# Patient Record
Sex: Female | Born: 1975 | Race: Black or African American | Hispanic: No | Marital: Married | Smoking: Former smoker
Health system: Southern US, Community
[De-identification: ages and names within clinical notes are randomized; demographics above are authoritative.]

## PROBLEM LIST (undated history)

## (undated) ENCOUNTER — Ambulatory Visit: Payer: No Typology Code available for payment source

## (undated) DIAGNOSIS — J309 Allergic rhinitis, unspecified: Secondary | ICD-10-CM

## (undated) DIAGNOSIS — D509 Iron deficiency anemia, unspecified: Secondary | ICD-10-CM

## (undated) DIAGNOSIS — G4733 Obstructive sleep apnea (adult) (pediatric): Secondary | ICD-10-CM

## (undated) DIAGNOSIS — M549 Dorsalgia, unspecified: Secondary | ICD-10-CM

## (undated) DIAGNOSIS — K219 Gastro-esophageal reflux disease without esophagitis: Secondary | ICD-10-CM

## (undated) DIAGNOSIS — E063 Autoimmune thyroiditis: Secondary | ICD-10-CM

## (undated) DIAGNOSIS — E559 Vitamin D deficiency, unspecified: Secondary | ICD-10-CM

## (undated) DIAGNOSIS — G8929 Other chronic pain: Secondary | ICD-10-CM

## (undated) DIAGNOSIS — F419 Anxiety disorder, unspecified: Secondary | ICD-10-CM

## (undated) DIAGNOSIS — Z8269 Family history of other diseases of the musculoskeletal system and connective tissue: Secondary | ICD-10-CM

## (undated) DIAGNOSIS — E038 Other specified hypothyroidism: Secondary | ICD-10-CM

## (undated) HISTORY — PX: ABDOMINAL HYSTERECTOMY: SHX81

## (undated) HISTORY — DX: Morbid (severe) obesity due to excess calories: E66.01

## (undated) HISTORY — DX: Dorsalgia, unspecified: M54.9

## (undated) HISTORY — DX: Anxiety disorder, unspecified: F41.9

## (undated) HISTORY — DX: Iron deficiency anemia, unspecified: D50.9

## (undated) HISTORY — DX: Allergic rhinitis, unspecified: J30.9

## (undated) HISTORY — PX: CHOLECYSTECTOMY: SHX55

## (undated) HISTORY — DX: Obstructive sleep apnea (adult) (pediatric): G47.33

## (undated) HISTORY — DX: Autoimmune thyroiditis: E06.3

## (undated) HISTORY — DX: Other chronic pain: G89.29

## (undated) HISTORY — DX: Other specified hypothyroidism: E03.8

## (undated) HISTORY — DX: Vitamin D deficiency, unspecified: E55.9

## (undated) HISTORY — DX: Family history of other diseases of the musculoskeletal system and connective tissue: Z82.69

---

## 2015-09-02 DIAGNOSIS — D500 Iron deficiency anemia secondary to blood loss (chronic): Secondary | ICD-10-CM | POA: Insufficient documentation

## 2015-09-02 DIAGNOSIS — E559 Vitamin D deficiency, unspecified: Secondary | ICD-10-CM | POA: Insufficient documentation

## 2015-09-02 DIAGNOSIS — E038 Other specified hypothyroidism: Secondary | ICD-10-CM | POA: Insufficient documentation

## 2016-02-09 DIAGNOSIS — Z8269 Family history of other diseases of the musculoskeletal system and connective tissue: Secondary | ICD-10-CM | POA: Insufficient documentation

## 2016-02-09 DIAGNOSIS — G4733 Obstructive sleep apnea (adult) (pediatric): Secondary | ICD-10-CM | POA: Insufficient documentation

## 2017-03-26 ENCOUNTER — Emergency Department (HOSPITAL_COMMUNITY)
Admission: EM | Admit: 2017-03-26 | Discharge: 2017-03-26 | Disposition: A | Discharge status: Home / Self Care | Payer: BLUE CROSS/BLUE SHIELD | Attending: Emergency Medicine | Admitting: Emergency Medicine

## 2017-03-26 ENCOUNTER — Emergency Department (HOSPITAL_COMMUNITY): Payer: BLUE CROSS/BLUE SHIELD

## 2017-03-26 ENCOUNTER — Encounter (HOSPITAL_COMMUNITY): Payer: Self-pay | Admitting: Emergency Medicine

## 2017-03-26 ENCOUNTER — Other Ambulatory Visit: Payer: Self-pay

## 2017-03-26 DIAGNOSIS — M79644 Pain in right finger(s): Secondary | ICD-10-CM

## 2017-03-26 HISTORY — DX: Gastro-esophageal reflux disease without esophagitis: K21.9

## 2017-03-26 NOTE — ED Triage Notes (Signed)
Pt reports R thumb pain x 2 weeks, denies injury but reports moving and using hands more than usual that week.  Pt reports bump on thumb, not present on L thumb, reports full ROM but with pain.

## 2017-03-26 NOTE — Discharge Instructions (Signed)
X-ray is reassuring.  No fracture or evidence of arthritis.  Please use 800 mg ibuprofen every 6 hours as needed for pain.  You can also use topical medicine like icy hot for your symptoms.  Ice the finger twice a day for 15 minutes at a time.  You may follow-up with your primary care provider if your pain continues in a week.

## 2017-03-26 NOTE — ED Provider Notes (Signed)
[facility] EMERGENCY DEPARTMENT Provider Note   CSN: [id number] Arrival date & time: 03/26/17  1006     History   Chief Complaint Chief Complaint  Patient presents with  . Hand Pain    HPI Heather Campos is a 42 y.o. female.  HPI   [name] is a 42 year old female with a history of GERD who presents to the emergency department for evaluation of right thumb pain.  States that her pain has been ongoing for 2 weeks now.  Pain is located over the right MCP joint of the 1st finger. Patient states that it feels like she jammed her right thumb and that it needs to be popped. Her pain is 6/10 in severity and worsened with thumb movement. She has tried taking Tylenol for her symptoms with mild improvement. She denies inciting injury, reports that she is right-handed and recently using her hands more as she is in the process of a move. She denies numbness, weakness, joint redness/swelling, wound, arthralgias elsewhere.  Past Medical History:  Diagnosis Date  . GERD (gastroesophageal reflux disease)   . Thyroid disease     There are no active problems to display for this patient.   Past Surgical History:  Procedure Laterality Date  . CESAREAN SECTION    . CHOLECYSTECTOMY      OB History    No data available       Home Medications    Prior to Admission medications   Not on File    Family History No family history on file.  Social History Social History   Tobacco Use  . Smoking status: Never Smoker  . Smokeless tobacco: Never Used  Substance Use Topics  . Alcohol use: No    Frequency: Never  . Drug use: No     Allergies   Patient has no known allergies.   Review of Systems Review of Systems  Musculoskeletal: Positive for arthralgias (right thumb pain). Negative for joint swelling.  Skin: Negative for color change and wound.  Neurological: Negative for weakness and numbness.     Physical Exam Updated Vital Signs BP 128/86 (BP  Location: Right Arm)   Pulse 93   Temp 98.6 F (37 C) (Oral)   Resp 16   LMP 03/04/2017 (Exact Date)   SpO2 99%   Physical Exam  Constitutional: She is oriented to person, place, and time. She appears well-developed and well-nourished. No distress.  HENT:  Head: Normocephalic and atraumatic.  Eyes: Right eye exhibits no discharge. Left eye exhibits no discharge.  Pulmonary/Chest: Effort normal. No respiratory distress.  Musculoskeletal:  1st MCP joint of the right hand somewhat tender to palpation. No obvious deformity, erythema, warmth, ecchymosis, swelling or break in skin. Full ROM of the thumb. Grip strength 5/5 bilaterally. Radial pulse 2+ bilaterally. Capillary refill <2sec. Distal sensation to light touch intact.   Neurological: She is alert and oriented to person, place, and time. Coordination normal.  Skin: Skin is warm and dry. Capillary refill takes less than 2 seconds. She is not diaphoretic.  Psychiatric: She has a normal mood and affect. Her behavior is normal.  Nursing note and vitals reviewed.    ED Treatments / Results  Labs (all labs ordered are listed, but only abnormal results are displayed) Labs Reviewed - No data to display  EKG  EKG Interpretation None       Radiology Dg Finger Thumb Right  Result Date: 03/26/2017 CLINICAL DATA:  Right thumb pain for 2 weeks. EXAM:  RIGHT THUMB 2+V COMPARISON:  None. FINDINGS: There is no evidence of fracture or dislocation. There is no evidence of arthropathy or other focal bone abnormality. Soft tissues are unremarkable IMPRESSION: Negative. Electronically Signed   By: Fidela Salisbury M.D.   On: 03/26/2017 11:09    Procedures Procedures (including critical care time)  Medications Ordered in ED Medications - No data to display   Initial Impression / Assessment and Plan / ED Course  I have reviewed the triage vital signs and the nursing notes.  Pertinent labs & imaging results that were available during my  care of the patient were reviewed by me and considered in my medical decision making (see chart for details).    Xray of right thumb without acute fracture or abnormality. She has full ROM, no indication for ligamentous injury. The finger is neurovascularly intact on exam. No warmth, erythema or break in skin to suggest infection. Have counseled patient on NSAID use for pain and RICE protocol. Discussed follow up with her PCP if symptoms are not improving in a week. Patient agrees and voices understanding to the above plan.   Final Clinical Impressions(s) / ED Diagnoses   Final diagnoses:  None    ED Discharge Orders    None       Bernarda Caffey 03/27/17 6256    Tanna Furry, MD 03/27/17 2157

## 2017-03-26 NOTE — ED Provider Notes (Signed)
[facility] EMERGENCY DEPARTMENT Provider Note   CSN: [id number] Arrival date & time: 03/26/17  1006     History   Chief Complaint Chief Complaint  Patient presents with  . Hand Pain    HPI Heather Campos is a 42 y.o. female.  HPI  Heather Campos is a 42 year old female with a history of GERD who presents to the emergency department for evaluation of right thumb pain.  Patient states that her pain is primarily located over the first MCP joint of the right hand.  Her pain is been ongoing for 2 weeks now, she denies inciting injury.  Pain is constant, worsened with movement of the thumb.  States that her pain is moderate in severity and feels aching in nature.  She is right-handed and states that she recently was moving boxes which may be contributing to her pain.  She has tried icing the hand and taking Tylenol without significant relief.  Reports "it feels like someone needs to pop my thumb."  She denies fever, chills, numbness, weakness, erythema, swelling, wound, joint pain elsewhere.  Past Medical History:  Diagnosis Date  . GERD (gastroesophageal reflux disease)   . Thyroid disease     There are no active problems to display for this patient.   Past Surgical History:  Procedure Laterality Date  . CESAREAN SECTION    . CHOLECYSTECTOMY      OB History    No data available       Home Medications    Prior to Admission medications   Not on File    Family History No family history on file.  Social History Social History   Tobacco Use  . Smoking status: Never Smoker  . Smokeless tobacco: Never Used  Substance Use Topics  . Alcohol use: No    Frequency: Never  . Drug use: No     Allergies   Patient has no known allergies.   Review of Systems Review of Systems  Constitutional: Negative for chills, fatigue and fever.  Musculoskeletal: Positive for arthralgias (right thumb). Negative for joint swelling.  Skin: Negative for color  change and wound.  Neurological: Negative for weakness and numbness.     Physical Exam Updated Vital Signs BP 128/86 (BP Location: Right Arm)   Pulse 93   Temp 98.6 F (37 C) (Oral)   Resp 16   LMP 03/04/2017 (Exact Date)   SpO2 99%   Physical Exam  Constitutional: She appears well-developed and well-nourished. No distress.  HENT:  Head: Normocephalic and atraumatic.  Eyes: Right eye exhibits no discharge. Left eye exhibits no discharge.  Pulmonary/Chest: Effort normal. No respiratory distress.  Musculoskeletal:  Right thumb and hand are nontender.  No obvious deformity.  No erythema, ecchymosis or break in skin.  Full active range of motion of the right thumb and wrist.  Radial pulses 2+ bilaterally.  Capillary refill <2sec.   Neurological: She is alert. Coordination normal.  Distal sensation to light/sharp touch intact in right thumb.  Skin: Skin is warm and dry. She is not diaphoretic.  Psychiatric: She has a normal mood and affect. Her behavior is normal.  Nursing note and vitals reviewed.    ED Treatments / Results  Labs (all labs ordered are listed, but only abnormal results are displayed) Labs Reviewed - No data to display  EKG  EKG Interpretation None       Radiology Dg Finger Thumb Right  Result Date: 03/26/2017 CLINICAL DATA:  Right thumb pain for  2 weeks. EXAM: RIGHT THUMB 2+V COMPARISON:  None. FINDINGS: There is no evidence of fracture or dislocation. There is no evidence of arthropathy or other focal bone abnormality. Soft tissues are unremarkable IMPRESSION: Negative. Electronically Signed   By: Fidela Salisbury M.D.   On: 03/26/2017 11:09    Procedures Procedures (including critical care time)  Medications Ordered in ED Medications - No data to display   Initial Impression / Assessment and Plan / ED Course  I have reviewed the triage vital signs and the nursing notes.  Pertinent labs & imaging results that were available during my care of  the patient were reviewed by me and considered in my medical decision making (see chart for details).    X-ray without acute abnormality.  Right hand neurovascularly intact on exam.  No erythema, warmth or break in skin to suggest infection.   She has full active range of motion of the thumb, do not suspect ligamentous injury.  Patient is asking for thumb splint for comfort. Will provide this for her and also counseled her on NSAID use and RICE protocol.  Have counseled her to follow-up with her PCP in a week should her pain continue.  Patient agrees and voiced understanding to the above plan.  Final Clinical Impressions(s) / ED Diagnoses   Final diagnoses:  Pain of right thumb    ED Discharge Orders    None       Glyn Ade, PA-C 03/26/17 1143    Glyn Ade, PA-C 03/27/17 0623    Tanna Furry, MD 03/27/17 2157

## 2017-03-26 NOTE — ED Notes (Signed)
Declined W/C at D/C and was escorted to lobby by RN. 

## 2017-06-16 ENCOUNTER — Emergency Department (HOSPITAL_COMMUNITY): Payer: BLUE CROSS/BLUE SHIELD

## 2017-06-16 ENCOUNTER — Emergency Department (HOSPITAL_COMMUNITY)
Admission: EM | Admit: 2017-06-16 | Discharge: 2017-06-16 | Disposition: A | Discharge status: Home / Self Care | Payer: BLUE CROSS/BLUE SHIELD | Attending: Emergency Medicine | Admitting: Emergency Medicine

## 2017-06-16 ENCOUNTER — Encounter (HOSPITAL_COMMUNITY): Payer: Self-pay

## 2017-06-16 ENCOUNTER — Other Ambulatory Visit: Payer: Self-pay

## 2017-06-16 DIAGNOSIS — R0789 Other chest pain: Secondary | ICD-10-CM

## 2017-06-16 DIAGNOSIS — R079 Chest pain, unspecified: Secondary | ICD-10-CM | POA: Insufficient documentation

## 2017-06-16 DIAGNOSIS — R0602 Shortness of breath: Secondary | ICD-10-CM | POA: Insufficient documentation

## 2017-06-16 LAB — BASIC METABOLIC PANEL
Anion gap: 9 (ref 5–15)
BUN: 13 mg/dL (ref 6–20)
CO2: 23 mmol/L (ref 22–32)
Calcium: 9.1 mg/dL (ref 8.9–10.3)
Chloride: 106 mmol/L (ref 101–111)
Creatinine, Ser: 0.8 mg/dL (ref 0.44–1.00)
GFR calc Af Amer: >60 mL/min (ref >60)
GFR calc non Af Amer: >60 mL/min (ref >60)
Glucose, Bld: 107 mg/dL — ABNORMAL HIGH (ref 65–99)
Potassium: 3.7 mmol/L (ref 3.5–5.1)
Sodium: 138 mmol/L (ref 135–145)

## 2017-06-16 LAB — CBC
HCT: 36.8 % (ref 36.0–46.0)
Hemoglobin: 12.6 g/dL (ref 12.0–15.0)
MCH: 26.7 pg (ref 26.0–34.0)
MCHC: 34.2 g/dL (ref 30.0–36.0)
MCV: 78 fL (ref 78.0–100.0)
Platelets: 286 10*3/uL (ref 150–400)
RBC: 4.72 MIL/uL (ref 3.87–5.11)
RDW: 15.3 % (ref 11.5–15.5)
WBC: 9.3 10*3/uL (ref 4.0–10.5)

## 2017-06-16 LAB — I-STAT TROPONIN, ED
Troponin i, poc: 0 ng/mL (ref 0.00–0.08)
Troponin i, poc: 0 ng/mL (ref 0.00–0.08)

## 2017-06-16 LAB — I-STAT BETA HCG BLOOD, ED (MC, WL, AP ONLY): I-stat hCG, quantitative: <5 m[IU]/mL (ref <5)

## 2017-06-16 NOTE — ED Notes (Signed)
Bed: WA22 Expected date:  Expected time:  Means of arrival:  Comments: 

## 2017-06-16 NOTE — ED Provider Notes (Signed)
[facility] DEPT Provider Note   CSN: [id number] Arrival date & time: 06/16/17  1717     History   Chief Complaint Chief Complaint  Patient presents with  . Chest Pain    HPI Heather Campos is a 42 y.o. female.  Complains of anterior chest pain and left-sided neck pain onset 4 PM today lasted 15 minutes obvious pressure accompanied by shortness of breath and mild nausea no sweatiness discomfort was onset while driving a car.  She also reports tingling in both arms for 1 week which has been constant other associated symptoms include metallic taste in her mouth for approximately 2 weeks.  She presently is asymptomatic except for mild neck pain.  Shortness of breath and nausea have resolved.  No treatment prior to coming here.  Nothing makes symptoms better or worse.  No other associated symptoms  HPI  Past Medical History:  Diagnosis Date  . GERD (gastroesophageal reflux disease)   . Thyroid disease     There are no active problems to display for this patient.   Past Surgical History:  Procedure Laterality Date  . CESAREAN SECTION    . CHOLECYSTECTOMY       OB History   None      Home Medications    Prior to Admission medications   Not on File    Family History History reviewed. No pertinent family history. Family history negative for Cardiac disease Social History Social History   Tobacco Use  . Smoking status: Never Smoker  . Smokeless tobacco: Never Used  Substance Use Topics  . Alcohol use: No    Frequency: Never  . Drug use: No     Allergies   Patient has no known allergies.   Review of Systems Review of Systems  Constitutional: Negative.   HENT: Negative.        Metallic taste  Respiratory: Positive for shortness of breath.   Cardiovascular: Positive for chest pain.  Gastrointestinal: Positive for nausea.  Genitourinary:       Irregular menses  Musculoskeletal: Negative.   Skin: Negative.     Neurological: Negative.   Psychiatric/Behavioral: Negative.   All other systems reviewed and are negative.    Physical Exam Updated Vital Signs LMP 06/14/2017   Physical Exam  Constitutional: She appears well-developed and well-nourished.  HENT:  Head: Normocephalic and atraumatic.  Eyes: Pupils are equal, round, and reactive to light. Conjunctivae are normal.  Neck: Neck supple. No tracheal deviation present. No thyromegaly present.  No bruit  Cardiovascular: Normal rate, regular rhythm and normal heart sounds.  No murmur heard. Pulmonary/Chest: Effort normal and breath sounds normal.  Abdominal: Soft. Bowel sounds are normal. She exhibits no distension. There is no tenderness.  Obese  Musculoskeletal: Normal range of motion. She exhibits no edema or tenderness.  Neurological: She is alert. Coordination normal.  Skin: Skin is warm and dry. No rash noted.  Psychiatric: She has a normal mood and affect.  Nursing note and vitals reviewed.    ED Treatments / Results  Labs (all labs ordered are listed, but only abnormal results are displayed) Labs Reviewed  BASIC METABOLIC PANEL - Abnormal; Notable for the following components:      Result Value   Glucose, Bld 107 (*)    All other components within normal limits  CBC  I-STAT TROPONIN, ED  I-STAT BETA HCG BLOOD, ED ([facility], [facility], [facility] ONLY)    EKG EKG Interpretation  Date/Time:  Friday June 16 2017 17:27:17  EDT Ventricular Rate:  95 PR Interval:    QRS Duration: 89 QT Interval:  365 QTC Calculation: 459 R Axis:   -2 Text Interpretation:  Sinus rhythm Low voltage, precordial leads Baseline wander in lead(s) V6 No old tracing to compare Confirmed by [place], Inocente Salles [phone]) on 06/16/2017 7:11:22 PM   Radiology Dg Chest 2 View  Result Date: 06/16/2017 CLINICAL DATA:  Chest pain with shortness of breath EXAM: CHEST - 2 VIEW COMPARISON:  None. FINDINGS: The heart size and mediastinal contours are within normal limits. Both  lungs are clear. The visualized skeletal structures are unremarkable. IMPRESSION: No active cardiopulmonary disease. Electronically Signed   By: Donavan Foil M.D.   On: 06/16/2017 18:03    Procedures Procedures (including critical care time)  Medications Ordered in ED Medications - No data to display  Chest x-ray viewed by me Initial Impression / Assessment and Plan / ED Course  I have reviewed the triage vital signs and the nursing notes.  Pertinent labs & imaging results that were available during my care of the patient were reviewed by me and considered in my medical decision making (see chart for details).    9:45 PM patient asymptomatic, pain-free  Heart score =1 stone single risk factor of obesity Referral primary care Final Clinical Impressions(s) / ED Diagnoses  Diagnosis atypical chest pain Final diagnoses:  None    ED Discharge Orders    None       Orlie Dakin, MD 06/16/17 2151

## 2017-06-16 NOTE — Discharge Instructions (Addendum)
Call the number on these instructions to get a primary care physician.  Return if concern for any reason

## 2017-06-16 NOTE — ED Triage Notes (Signed)
Pt reports chest pain, arm pain and SOB. She states that her arms have been aching bilaterally for about a week, but today, her L feels especially heavy. The pain goes up neck. The SOB and chest pain started about an hour ago. A&Ox4. Denies cardiac history. Endorses nausea. Denies vomiting.

## 2017-07-26 ENCOUNTER — Other Ambulatory Visit: Payer: Self-pay | Admitting: Obstetrics & Gynecology

## 2017-07-26 DIAGNOSIS — Z1231 Encounter for screening mammogram for malignant neoplasm of breast: Secondary | ICD-10-CM

## 2017-09-22 ENCOUNTER — Emergency Department (HOSPITAL_COMMUNITY)
Admission: EM | Admit: 2017-09-22 | Discharge: 2017-09-22 | Disposition: A | Discharge status: Home / Self Care | Payer: Self-pay | Attending: Emergency Medicine | Admitting: Emergency Medicine

## 2017-09-22 ENCOUNTER — Encounter (HOSPITAL_COMMUNITY): Payer: Self-pay

## 2017-09-22 ENCOUNTER — Emergency Department (HOSPITAL_COMMUNITY): Payer: Self-pay

## 2017-09-22 ENCOUNTER — Other Ambulatory Visit: Payer: Self-pay

## 2017-09-22 DIAGNOSIS — Y929 Unspecified place or not applicable: Secondary | ICD-10-CM | POA: Insufficient documentation

## 2017-09-22 DIAGNOSIS — Y999 Unspecified external cause status: Secondary | ICD-10-CM | POA: Insufficient documentation

## 2017-09-22 DIAGNOSIS — Y939 Activity, unspecified: Secondary | ICD-10-CM | POA: Insufficient documentation

## 2017-09-22 DIAGNOSIS — M79672 Pain in left foot: Secondary | ICD-10-CM | POA: Insufficient documentation

## 2017-09-22 DIAGNOSIS — W228XXA Striking against or struck by other objects, initial encounter: Secondary | ICD-10-CM | POA: Insufficient documentation

## 2017-09-22 MED ORDER — HYDROCODONE-ACETAMINOPHEN 5-325 MG PO TABS
1.0000 | ORAL_TABLET | Freq: Once | ORAL | Status: AC
  Administered 2017-09-22: 1 via ORAL
  Filled 2017-09-22: qty 1

## 2017-09-22 MED ORDER — IBUPROFEN 200 MG PO TABS
600.0000 mg | ORAL_TABLET | Freq: Once | ORAL | Status: AC
  Administered 2017-09-22: 600 mg via ORAL
  Filled 2017-09-22: qty 3

## 2017-09-22 MED ORDER — HYDROCODONE-ACETAMINOPHEN 5-325 MG PO TABS: 1.0000 | ORAL_TABLET | Freq: Four times a day (QID) | ORAL | 0 refills | Status: DC | PRN

## 2017-09-22 NOTE — Discharge Instructions (Addendum)
You have been seen today for a foot injury. There were no acute abnormalities on the x-rays, including no sign of fracture or dislocation, however, there could be injuries to the soft tissues, such as the ligaments or tendons that are not seen on xrays. There could also be what are called occult fractures that are small fractures not seen on xray. Pain: Take 600 mg of ibuprofen every 6 hours or 440 mg (over the counter dose) to 500 mg (prescription dose) of naproxen every 12 hours for the next 3 days. After this time, these medications may be used as needed for pain. Take these medications with food to avoid upset stomach. Choose only one of these medications, do not take them together.  Tylenol: Should you continue to have additional pain while taking the ibuprofen or naproxen, you may add in tylenol as needed. Your daily total maximum amount of tylenol from all sources should be limited to 4000mg /day for persons without liver problems, or 2000mg /day for those with liver problems. Vicodin: May take Vicodin as needed for severe pain.  Do not drive or perform other dangerous activities while taking the Vicodin.  Please note that each pill of Vicodin contains 325 mg of Tylenol and the above dosage limits apply. Ice: May apply ice to the area over the next 24 hours for 15 minutes at a time to reduce swelling. Elevation: Keep the extremity elevated as often as possible to reduce pain and inflammation. Support: Wear the cam walker for support and comfort. Wear this until pain resolves. You will be weight-bearing as tolerated, which means you can slowly start to put weight on the extremity and increase amount and frequency as pain allows. Follow up: Follow up with the orthopedic or foot specialist within two weeks. Return: Return to the ED for numbness, weakness, increasing pain, overall worsening symptoms, loss of function, or if symptoms are not improving, you have tried to follow up with the orthopedic  specialist, and have been unable to do so.

## 2017-09-22 NOTE — ED Triage Notes (Signed)
Pt is concerned she broke her left foot on her 4th toe. Pt states she knocked it into something on 7/15

## 2017-09-22 NOTE — ED Provider Notes (Signed)
[facility] DEPT Provider Note   CSN: [id number] Arrival date & time: 09/22/17  1622     History   Chief Complaint Chief Complaint  Patient presents with  . Toe Pain    HPI Heather Campos is a 42 y.o. female.  HPI   Heather Campos is a 42 y.o. female, with a history of GERD, presenting to the ED with left foot pain for about the last 2 weeks.  States she ran into a piece of furniture with her left foot on July 15.  Pain has persisted.  She has been taking Tylenol.  Denies numbness, weakness, subsequent injury, swelling, or any other complaints.     Past Medical History:  Diagnosis Date  . GERD (gastroesophageal reflux disease)   . Thyroid disease     There are no active problems to display for this patient.   Past Surgical History:  Procedure Laterality Date  . CESAREAN SECTION    . CHOLECYSTECTOMY       OB History   None      Home Medications    Prior to Admission medications   Medication Sig Start Date End Date Taking? Authorizing Provider  HYDROcodone-acetaminophen (NORCO/VICODIN) 5-325 MG tablet Take 1-2 tablets by mouth every 6 (six) hours as needed for severe pain. 09/22/17   Raylea Adcox, Helane Gunther, PA-C    Family History No family history on file.  Social History Social History   Tobacco Use  . Smoking status: Never Smoker  . Smokeless tobacco: Never Used  Substance Use Topics  . Alcohol use: No    Frequency: Never  . Drug use: No     Allergies   Patient has no known allergies.   Review of Systems Review of Systems  Musculoskeletal: Positive for arthralgias.  Neurological: Negative for weakness and numbness.     Physical Exam Updated Vital Signs BP 129/82 (BP Location: Left Arm)   Pulse 89   Temp 99.4 F (37.4 C) (Oral)   Resp 14   Ht 5\' 4"  (1.626 m)   Wt 114.8 kg (253 lb)   LMP 09/06/2017   SpO2 97%   BMI 43.43 kg/m   Physical Exam  Constitutional: She appears well-developed and well-nourished.  No distress.  HENT:  Head: Normocephalic and atraumatic.  Eyes: Conjunctivae are normal.  Neck: Neck supple.  Cardiovascular: Normal rate, regular rhythm and intact distal pulses.  Pulmonary/Chest: Effort normal.  Musculoskeletal: She exhibits tenderness. She exhibits no edema or deformity.  Tenderness to the left fourth toe into the dorsal fourth metatarsal.  No noted deformity, swelling, erythema, or bruising.  No pain or tenderness in the left ankle.  Neurological: She is alert.  Sensation to light touch grossly intact in the left foot and toes.  Skin: Skin is warm and dry. Capillary refill takes less than 2 seconds. She is not diaphoretic. No pallor.  Psychiatric: She has a normal mood and affect. Her behavior is normal.  Nursing note and vitals reviewed.    ED Treatments / Results  Labs (all labs ordered are listed, but only abnormal results are displayed) Labs Reviewed - No data to display  EKG None  Radiology Dg Foot Complete Left  Result Date: 09/22/2017 CLINICAL DATA:  Patient states that she struck her foot on a furniture dresser at home approximately 2-3 weeks ago. Persistent pain involving the fourth toe. Initial encounter. EXAM: LEFT FOOT - COMPLETE 3+ VIEW COMPARISON:  None. FINDINGS: No evidence of acute fracture or dislocation. Joint spaces  well preserved. Well-preserved bone mineral density. No intrinsic osseous abnormalities. Very small enthesopathic spur at the insertion of the Achilles tendon on the calcaneus. IMPRESSION: No acute or significant osseous abnormality. Electronically Signed   By: Evangeline Dakin M.D.   On: 09/22/2017 17:09    Procedures Procedures (including critical care time)  Medications Ordered in ED Medications  HYDROcodone-acetaminophen (NORCO/VICODIN) 5-325 MG per tablet 1 tablet (has no administration in time range)  ibuprofen (ADVIL,MOTRIN) tablet 600 mg (has no administration in time range)     Initial Impression / Assessment and  Plan / ED Course  I have reviewed the triage vital signs and the nursing notes.  Pertinent labs & imaging results that were available during my care of the patient were reviewed by me and considered in my medical decision making (see chart for details).     Patient presents with persistent left toe and foot pain following an injury.  No acute osseous abnormality on x-ray.  She will follow-up with orthopedics versus podiatry. The patient was given instructions for home care as well as return precautions. Patient voices understanding of these instructions, accepts the plan, and is comfortable with discharge.  Final Clinical Impressions(s) / ED Diagnoses   Final diagnoses:  Left foot pain    ED Discharge Orders        Ordered    HYDROcodone-acetaminophen (NORCO/VICODIN) 5-325 MG tablet  Every 6 hours PRN     09/22/17 1751       Lorayne Bender, PA-C 09/22/17 1759    Dorie Rank, MD 09/22/17 2342

## 2018-03-13 ENCOUNTER — Encounter: Payer: Self-pay | Admitting: Family Medicine

## 2018-03-14 ENCOUNTER — Encounter: Payer: Self-pay | Admitting: Family Medicine

## 2018-03-14 ENCOUNTER — Ambulatory Visit (INDEPENDENT_AMBULATORY_CARE_PROVIDER_SITE_OTHER): Payer: Self-pay | Admitting: Family Medicine

## 2018-03-14 ENCOUNTER — Other Ambulatory Visit: Payer: Self-pay

## 2018-03-14 VITALS — BP 126/75 | HR 71 | Resp 17 | Ht 67.0 in | Wt 255.8 lb

## 2018-03-14 DIAGNOSIS — Z7689 Persons encountering health services in other specified circumstances: Secondary | ICD-10-CM

## 2018-03-14 DIAGNOSIS — Z840 Family history of diseases of the skin and subcutaneous tissue: Secondary | ICD-10-CM

## 2018-03-14 DIAGNOSIS — E038 Other specified hypothyroidism: Secondary | ICD-10-CM

## 2018-03-14 DIAGNOSIS — Z6841 Body Mass Index (BMI) 40.0 and over, adult: Secondary | ICD-10-CM

## 2018-03-14 DIAGNOSIS — E063 Autoimmune thyroiditis: Secondary | ICD-10-CM

## 2018-03-14 DIAGNOSIS — Z1389 Encounter for screening for other disorder: Secondary | ICD-10-CM

## 2018-03-14 DIAGNOSIS — Z131 Encounter for screening for diabetes mellitus: Secondary | ICD-10-CM

## 2018-03-14 DIAGNOSIS — Z23 Encounter for immunization: Secondary | ICD-10-CM

## 2018-03-14 DIAGNOSIS — E559 Vitamin D deficiency, unspecified: Secondary | ICD-10-CM

## 2018-03-14 DIAGNOSIS — Z3202 Encounter for pregnancy test, result negative: Secondary | ICD-10-CM

## 2018-03-14 DIAGNOSIS — D649 Anemia, unspecified: Secondary | ICD-10-CM

## 2018-03-14 LAB — POCT URINALYSIS DIP (CLINITEK)
Bilirubin, UA: NEGATIVE
Blood, UA: NEGATIVE
Glucose, UA: NEGATIVE mg/dL
Ketones, POC UA: NEGATIVE mg/dL
Leukocytes, UA: NEGATIVE
Nitrite, UA: NEGATIVE
POC PROTEIN,UA: NEGATIVE
Spec Grav, UA: 1.02 (ref 1.010–1.025)
Urobilinogen, UA: 0.2 E.U./dL
pH, UA: 5.5 (ref 5.0–8.0)

## 2018-03-14 LAB — POCT URINE PREGNANCY: Preg Test, Ur: NEGATIVE

## 2018-03-14 NOTE — Progress Notes (Signed)
Heather Campos, is a 43 y.o. female  WER:[id number]  PPJ:[id number]  DOB - November 10, 1975  CC:  Chief Complaint  Patient presents with  . Establish Care  . Hypothyroidism  . Anemia       HPI: Heather Campos is a 43 y.o. female is here today to establish care.    Heather Campos medical problems significant for hypothyroidism, iron deficiency anemia, folic acid deficiency, obesity, history of cholecystectomy 2018, recurrent axillary boils.  Patient is here today to establish care.  She recently moved to [facility] from [facility].  She was previously followed by primary care office in [place], [place].  She receives routine screening labs at her prior primary care office.  She reports routine screening of her vitamin D and iron level.  Her mother has lupus and has had complications with nephropathy requiring a renal transplant.  Her primary care was screening her for lupus every 6 months.  She is currently overdue for the screenings. Asymptomatic of fatigue, joint pain, hair loss, rash. 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 She is currently on extended leave prescribe doxycycline.  She works as a Psychologist, clinical.  Denies any routine physical activity. She is current on her flu shot.  Request a tetanus vaccine today.  Uncertain of last Tdap. Patient denies new headaches, chest pain, abdominal pain, nausea, new weakness , numbness or tingling, SOB, edema, or worrisome cough.   Current medications: Current Outpatient Medications:  .  Cholecalciferol (DIALYVITE VITAMIN D3 MAX) 1.25 MG (50000 UT) TABS, Take 1 tablet by mouth 2 (two) times a week., Disp: , Rfl:  .  clindamycin (CLEOCIN T) 1 % lotion, APPLY TOPICALLY ONCE DAILY TO INFECTED AREA FOR 30 DAYS, Disp: , Rfl:  .  doxycycline (VIBRAMYCIN) 100 MG capsule, TAKE 1 CAPSULE BY MOUTH ONCE DAILY FOR 30 DAYS, Disp: , Rfl:  .  ferrous sulfate 325 (65 FE) MG  tablet, Take 1 tablet by mouth 2 (two) times daily., Disp: , Rfl:  .  folic acid (FOLVITE) 1 MG tablet, Take 1 tablet by mouth daily., Disp: , Rfl:  .  levothyroxine (SYNTHROID, LEVOTHROID) 125 MCG tablet, Take 1 tablet by mouth daily., Disp: , Rfl:    Pertinent family medical history: family history includes Kidney disease in her mother; Lupus in her cousin and mother.   No Known Allergies  Social History   Socioeconomic History  . Marital status: Married    Spouse name: Not on file  . Number of children: Not on file  . Years of education: Not on file  . Highest education level: Not on file  Occupational History  . Not on file  Social Needs  . Financial resource strain: Not on file  . Food insecurity:    Worry: Not on file    Inability: Not on file  . Transportation needs:    Medical: Not on file    Non-medical: Not on file  Tobacco Use  . Smoking status: Former Research scientist (life sciences)  . Smokeless tobacco: Never Used  Substance and Sexual Activity  . Alcohol use: No    Frequency: Never  . Drug use: No  . Sexual activity: Yes    Birth control/protection: None  Lifestyle  . Physical activity:    Days per week: Not on file    Minutes per session: Not on file  . Stress: Not on file  Relationships  . Social connections:    Talks on phone:  Not on file    Gets together: Not on file    Attends religious service: Not on file    Active member of club or organization: Not on file    Attends meetings of clubs or organizations: Not on file    Relationship status: Not on file  . Intimate partner violence:    Fear of current or ex partner: Not on file    Emotionally abused: Not on file    Physically abused: Not on file    Forced sexual activity: Not on file  Other Topics Concern  . Not on file  Social History Narrative  . Not on file    Review of Systems: Pertinent negatives listed in HPI Objective:   Vitals:   03/14/18 0841  BP: 126/75  Pulse: 71  Resp: 17  SpO2: 99%    BP  Readings from Last 3 Encounters:  03/14/18 126/75  09/22/17 137/85  06/16/17 126/85    Filed Weights   03/14/18 0841  Weight: 255 lb 12.8 oz (116 kg)      Physical Exam: Constitutional: Patient appears well-developed and well-nourished. No distress. HENT: Normocephalic, atraumatic, External right and left ear normal. Oropharynx is clear and moist.  Eyes: Conjunctivae and EOM are normal. PERRLA, no scleral icterus. Neck: Normal ROM. Neck supple. No JVD. No tracheal deviation. No thyromegaly. CVS: RRR, S1/S2 +, no murmurs, no gallops, no carotid bruit.  Pulmonary: Effort and breath sounds normal, no stridor, rhonchi, wheezes, rales.  Abdominal: Soft. BS +, no distension, tenderness, rebound or guarding.  Musculoskeletal: Normal range of motion. No edema and no tenderness.  Neuro: Alert. Normal muscle tone coordination. Normal gait. Skin: Skin is warm and dry. No rash noted. Not diaphoretic. No erythema. No pallor. Psychiatric: Normal mood and affect. Behavior, judgment, thought content normal.  Lab Results (prior encounters)  Lab Results  Component Value Date   WBC 9.3 06/16/2017   HGB 12.6 06/16/2017   HCT 36.8 06/16/2017   MCV 78.0 06/16/2017   PLT 286 06/16/2017   Lab Results  Component Value Date   CREATININE 0.80 06/16/2017   BUN 13 06/16/2017   NA 138 06/16/2017   K 3.7 06/16/2017   CL 106 06/16/2017   CO2 23 06/16/2017       Assessment and plan:  1. Encounter to establish care 2. Hypothyroidism due to Hashimoto's thyroiditis -Currently prescribed levothyroxine 125 mcg.  Rechecking:  - Thyroid Panel With TSH  3. Screening for blood or protein in urine - POCT urine pregnancy  4. Family history of lupus erythematosus - ANA w/Reflex if Positive - Lupus anticoagulant - C3 and C4  5. Vitamin D deficiency - Vitamin D, 25-hydroxy  6. Severe obesity (BMI >= 40) (HCC) Encouraged efforts to reduce weight include engaging in physical activity as tolerated with  goal of 150 minutes per week. Improve dietary choices and eat a meal regimen consistent with a Mediterranean or DASH diet. Reduce simple carbohydrates. Do not skip meals and eat healthy snacks throughout the day to avoid over-eating at dinner. Set a goal weight loss that is achievable for you. Checking: - Lipid panel - Hemoglobin A1c  7. Anemia, unspecified type Checking: - CBC with Differential - Iron, TIBC and Ferritin Panel - Folate  8. Screening for diabetes mellitus - POCT URINALYSIS DIP (CLINITEK) - Comprehensive metabolic panel    Orders Placed This Encounter  Procedures  . Tdap vaccine greater than or equal to 7yo IM  . CBC with Differential  . Comprehensive  metabolic panel    Order Specific Question:   Has the patient fasted?    Answer:   No  . ANA w/Reflex if Positive  . Lupus anticoagulant  . C3 and C4  . Lipid panel    Order Specific Question:   Has the patient fasted?    Answer:   No  . Thyroid Panel With TSH  . Hemoglobin A1c  . Vitamin D, 25-hydroxy  . Iron, TIBC and Ferritin Panel  . Folate  . POCT URINALYSIS DIP (CLINITEK)  . POCT urine pregnancy      Return in about 3 months (around 06/13/2018) for routine well check .   The patient was given clear instructions to go to ER or return to medical center if symptoms don't improve, worsen or new problems develop. The patient verbalized understanding. The patient was advised  to call and obtain lab results if they haven't heard anything from out office within 7-10 business days.  Molli Barrows, FNP Primary Care at [facility] [address], [place] [address] [phone]: [phone]    This note has been created with Dragon speech recognition software and Engineer, materials. Any transcriptional errors are unintentional.

## 2018-03-14 NOTE — Patient Instructions (Addendum)
Thank you for choosing Primary Care at [facility] to be your medical home!    Heather Campos was seen by Molli Barrows, FNP today.   Heather Campos's primary care provider is Scot Jun, FNP.   For the best care possible, you should try to see Molli Barrows, FNP-C whenever you come to the clinic.   We look forward to seeing you again soon!  If you have any questions about your visit today, please call us at [phone] or feel free to reach your primary care provider via [place].     Health Maintenance, Female Adopting a healthy lifestyle and getting preventive care can go a long way to promote health and wellness. Talk with your health care provider about what schedule of regular examinations is right for you. This is a good chance for you to check in with your provider about disease prevention and staying healthy. In between checkups, there are plenty of things you can do on your own. Experts have done a lot of research about which lifestyle changes and preventive measures are most likely to keep you healthy. Ask your health care provider for more information. Weight and diet Eat a healthy diet  Be sure to include plenty of vegetables, fruits, low-fat dairy products, and lean protein.  Do not eat a lot of foods high in solid fats, added sugars, or salt.  Get regular exercise. This is one of the most important things you can do for your health. ? Most adults should exercise for at least 150 minutes each week. The exercise should increase your heart rate and make you sweat (moderate-intensity exercise). ? Most adults should also do strengthening exercises at least twice a week. This is in addition to the moderate-intensity exercise. Maintain a healthy weight  Body mass index (BMI) is a measurement that can be used to identify possible weight problems. It estimates body fat based on height and weight. Your health care provider can help determine your BMI and help you  achieve or maintain a healthy weight.  For females 24 years of age and older: ? A BMI below 18.5 is considered underweight. ? A BMI of 18.5 to 24.9 is normal. ? A BMI of 25 to 29.9 is considered overweight. ? A BMI of 30 and above is considered obese. Watch levels of cholesterol and blood lipids  You should start having your blood tested for lipids and cholesterol at 41 years of age, then have this test every 5 years.  You may need to have your cholesterol levels checked more often if: ? Your lipid or cholesterol levels are high. ? You are older than 41 years of age. ? You are at high risk for heart disease. Cancer screening Lung Cancer  Lung cancer screening is recommended for adults 1-62 years old who are at high risk for lung cancer because of a history of smoking.  A yearly low-dose CT scan of the lungs is recommended for people who: ? Currently smoke. ? Have quit within the past 15 years. ? Have at least a 30-pack-year history of smoking. A pack year is smoking an average of one pack of cigarettes a day for 1 year.  Yearly screening should continue until it has been 15 years since you quit.  Yearly screening should stop if you develop a health problem that would prevent you from having lung cancer treatment. Breast Cancer  Practice breast self-awareness. This means understanding how your breasts normally appear and feel.  It also means doing  regular breast self-exams. Let your health care provider know about any changes, no matter how small.  If you are in your 20s or 30s, you should have a clinical breast exam (CBE) by a health care provider every 1-3 years as part of a regular health exam.  If you are 38 or older, have a CBE every year. Also consider having a breast X-ray (mammogram) every year.  If you have a family history of breast cancer, talk to your health care provider about genetic screening.  If you are at high risk for breast cancer, talk to your health care  provider about having an MRI and a mammogram every year.  Breast cancer gene (BRCA) assessment is recommended for women who have family members with BRCA-related cancers. BRCA-related cancers include: ? Breast. ? Ovarian. ? Tubal. ? Peritoneal cancers.  Results of the assessment will determine the need for genetic counseling and BRCA1 and BRCA2 testing. Cervical Cancer Your health care provider may recommend that you be screened regularly for cancer of the pelvic organs (ovaries, uterus, and vagina). This screening involves a pelvic examination, including checking for microscopic changes to the surface of your cervix (Pap test). You may be encouraged to have this screening done every 3 years, beginning at age 69.  For women ages 16-65, health care providers may recommend pelvic exams and Pap testing every 3 years, or they may recommend the Pap and pelvic exam, combined with testing for human papilloma virus (HPV), every 5 years. Some types of HPV increase your risk of cervical cancer. Testing for HPV may also be done on women of any age with unclear Pap test results.  Other health care providers may not recommend any screening for nonpregnant women who are considered low risk for pelvic cancer and who do not have symptoms. Ask your health care provider if a screening pelvic exam is right for you.  If you have had past treatment for cervical cancer or a condition that could lead to cancer, you need Pap tests and screening for cancer for at least 20 years after your treatment. If Pap tests have been discontinued, your risk factors (such as having a new sexual partner) need to be reassessed to determine if screening should resume. Some women have medical problems that increase the chance of getting cervical cancer. In these cases, your health care provider may recommend more frequent screening and Pap tests. Colorectal Cancer  This type of cancer can be detected and often prevented.  Routine  colorectal cancer screening usually begins at 41 years of age and continues through 41 years of age.  Your health care provider may recommend screening at an earlier age if you have risk factors for colon cancer.  Your health care provider may also recommend using home test kits to check for hidden blood in the stool.  A small camera at the end of a tube can be used to examine your colon directly (sigmoidoscopy or colonoscopy). This is done to check for the earliest forms of colorectal cancer.  Routine screening usually begins at age 65.  Direct examination of the colon should be repeated every 5-10 years through 41 years of age. However, you may need to be screened more often if early forms of precancerous polyps or small growths are found. Skin Cancer  Check your skin from head to toe regularly.  Tell your health care provider about any new moles or changes in moles, especially if there is a change in a mole's shape or color.  Also tell your health care provider if you have a mole that is larger than the size of a pencil eraser.  Always use sunscreen. Apply sunscreen liberally and repeatedly throughout the day.  Protect yourself by wearing long sleeves, pants, a wide-brimmed hat, and sunglasses whenever you are outside. Heart disease, diabetes, and high blood pressure  High blood pressure causes heart disease and increases the risk of stroke. High blood pressure is more likely to develop in: ? People who have blood pressure in the high end of the normal range (130-139/85-89 mm Hg). ? People who are overweight or obese. ? People who are African American.  If you are 76-34 years of age, have your blood pressure checked every 3-5 years. If you are 70 years of age or older, have your blood pressure checked every year. You should have your blood pressure measured twice-once when you are at a hospital or clinic, and once when you are not at a hospital or clinic. Record the average of the two  measurements. To check your blood pressure when you are not at a hospital or clinic, you can use: ? An automated blood pressure machine at a pharmacy. ? A home blood pressure monitor.  If you are between 59 years and 71 years old, ask your health care provider if you should take aspirin to prevent strokes.  Have regular diabetes screenings. This involves taking a blood sample to check your fasting blood sugar level. ? If you are at a normal weight and have a low risk for diabetes, have this test once every three years after 41 years of age. ? If you are overweight and have a high risk for diabetes, consider being tested at a younger age or more often. Preventing infection Hepatitis B  If you have a higher risk for hepatitis B, you should be screened for this virus. You are considered at high risk for hepatitis B if: ? You were born in a country where hepatitis B is common. Ask your health care provider which countries are considered high risk. ? Your parents were born in a high-risk country, and you have not been immunized against hepatitis B (hepatitis B vaccine). ? You have HIV or AIDS. ? You use needles to inject street drugs. ? You live with someone who has hepatitis B. ? You have had sex with someone who has hepatitis B. ? You get hemodialysis treatment. ? You take certain medicines for conditions, including cancer, organ transplantation, and autoimmune conditions. Hepatitis C  Blood testing is recommended for: ? Everyone born from [birth year] through 1963. ? Anyone with known risk factors for hepatitis C. Sexually transmitted infections (STIs)  You should be screened for sexually transmitted infections (STIs) including gonorrhea and chlamydia if: ? You are sexually active and are younger than 41 years of age. ? You are older than 41 years of age and your health care provider tells you that you are at risk for this type of infection. ? Your sexual activity has changed since you were last  screened and you are at an increased risk for chlamydia or gonorrhea. Ask your health care provider if you are at risk.  If you do not have HIV, but are at risk, it may be recommended that you take a prescription medicine daily to prevent HIV infection. This is called pre-exposure prophylaxis (PrEP). You are considered at risk if: ? You are sexually active and do not regularly use condoms or know the HIV status of your partner(s). ?  You take drugs by injection. ? You are sexually active with a partner who has HIV. Talk with your health care provider about whether you are at high risk of being infected with HIV. If you choose to begin PrEP, you should first be tested for HIV. You should then be tested every 3 months for as long as you are taking PrEP. Pregnancy  If you are premenopausal and you may become pregnant, ask your health care provider about preconception counseling.  If you may become pregnant, take 400 to 800 micrograms (mcg) of folic acid every day.  If you want to prevent pregnancy, talk to your health care provider about birth control (contraception). Osteoporosis and menopause  Osteoporosis is a disease in which the bones lose minerals and strength with aging. This can result in serious bone fractures. Your risk for osteoporosis can be identified using a bone density scan.  If you are 16 years of age or older, or if you are at risk for osteoporosis and fractures, ask your health care provider if you should be screened.  Ask your health care provider whether you should take a calcium or vitamin D supplement to lower your risk for osteoporosis.  Menopause may have certain physical symptoms and risks.  Hormone replacement therapy may reduce some of these symptoms and risks. Talk to your health care provider about whether hormone replacement therapy is right for you. Follow these instructions at home:  Schedule regular health, dental, and eye exams.  Stay current with your  immunizations.  Do not use any tobacco products including cigarettes, chewing tobacco, or electronic cigarettes.  If you are pregnant, do not drink alcohol.  If you are breastfeeding, limit how much and how often you drink alcohol.  Limit alcohol intake to no more than 1 drink per day for nonpregnant women. One drink equals 12 ounces of beer, 5 ounces of wine, or 1 ounces of hard liquor.  Do not use street drugs.  Do not share needles.  Ask your health care provider for help if you need support or information about quitting drugs.  Tell your health care provider if you often feel depressed.  Tell your health care provider if you have ever been abused or do not feel safe at home. This information is not intended to replace advice given to you by your health care provider. Make sure you discuss any questions you have with your health care provider. Document Released: 08/23/2010 Document Revised: 07/16/2015 Document Reviewed: 11/11/2014 Elsevier Interactive Patient Education  2019 [employer].

## 2018-03-15 LAB — CBC WITH DIFFERENTIAL/PLATELET
Basophils Absolute: 0 10*3/uL (ref 0.0–0.2)
Basos: 0 %
EOS (ABSOLUTE): 0.3 10*3/uL (ref 0.0–0.4)
Eos: 3 %
Hematocrit: 33.2 % — ABNORMAL LOW (ref 34.0–46.6)
Hemoglobin: 10.9 g/dL — ABNORMAL LOW (ref 11.1–15.9)
Immature Grans (Abs): 0 10*3/uL (ref 0.0–0.1)
Immature Granulocytes: 0 %
Lymphocytes Absolute: 3.2 10*3/uL — ABNORMAL HIGH (ref 0.7–3.1)
Lymphs: 39 %
MCH: 24.8 pg — ABNORMAL LOW (ref 26.6–33.0)
MCHC: 32.8 g/dL (ref 31.5–35.7)
MCV: 76 fL — ABNORMAL LOW (ref 79–97)
Monocytes Absolute: 0.6 10*3/uL (ref 0.1–0.9)
Monocytes: 8 %
Neutrophils Absolute: 4.2 10*3/uL (ref 1.4–7.0)
Neutrophils: 50 %
Platelets: 328 10*3/uL (ref 150–450)
RBC: 4.4 x10E6/uL (ref 3.77–5.28)
RDW: 17.9 % — ABNORMAL HIGH (ref 11.7–15.4)
WBC: 8.3 10*3/uL (ref 3.4–10.8)

## 2018-03-15 LAB — LIPID PANEL
Chol/HDL Ratio: 3.2 ratio (ref 0.0–4.4)
Cholesterol, Total: 178 mg/dL (ref 100–199)
HDL: 55 mg/dL (ref >39)
LDL Calculated: 106 mg/dL — ABNORMAL HIGH (ref 0–99)
Triglycerides: 84 mg/dL (ref 0–149)
VLDL Cholesterol Cal: 17 mg/dL (ref 5–40)

## 2018-03-15 LAB — COMPREHENSIVE METABOLIC PANEL
ALT: 23 IU/L (ref 0–32)
AST: 21 IU/L (ref 0–40)
Albumin/Globulin Ratio: 1.4 (ref 1.2–2.2)
Albumin: 4.1 g/dL (ref 3.8–4.8)
Alkaline Phosphatase: 92 IU/L (ref 39–117)
BUN/Creatinine Ratio: 11 (ref 9–23)
BUN: 8 mg/dL (ref 6–24)
Bilirubin Total: 0.5 mg/dL (ref 0.0–1.2)
CO2: 18 mmol/L — ABNORMAL LOW (ref 20–29)
Calcium: 9 mg/dL (ref 8.7–10.2)
Chloride: 104 mmol/L (ref 96–106)
Creatinine, Ser: 0.73 mg/dL (ref 0.57–1.00)
GFR calc Af Amer: 119 mL/min/{1.73_m2} (ref >59)
GFR calc non Af Amer: 103 mL/min/{1.73_m2} (ref >59)
Globulin, Total: 3 g/dL (ref 1.5–4.5)
Glucose: 94 mg/dL (ref 65–99)
Potassium: 3.8 mmol/L (ref 3.5–5.2)
Sodium: 137 mmol/L (ref 134–144)
Total Protein: 7.1 g/dL (ref 6.0–8.5)

## 2018-03-15 LAB — THYROID PANEL WITH TSH
Free Thyroxine Index: 1.3 (ref 1.2–4.9)
T3 Uptake Ratio: 22 % — ABNORMAL LOW (ref 24–39)
T4, Total: 6.1 ug/dL (ref 4.5–12.0)
TSH: 3.06 u[IU]/mL (ref 0.450–4.500)

## 2018-03-15 LAB — IRON,TIBC AND FERRITIN PANEL
Ferritin: 9 ng/mL — ABNORMAL LOW (ref 15–150)
Iron Saturation: 15 % (ref 15–55)
Iron: 56 ug/dL (ref 27–159)
Total Iron Binding Capacity: 377 ug/dL (ref 250–450)
UIBC: 321 ug/dL (ref 131–425)

## 2018-03-15 LAB — FOLATE: Folate: 16.4 ng/mL (ref >3.0)

## 2018-03-15 LAB — HEMOGLOBIN A1C
Est. average glucose Bld gHb Est-mCnc: 111 mg/dL
Hgb A1c MFr Bld: 5.5 % (ref 4.8–5.6)

## 2018-03-15 LAB — C3 AND C4
Complement C3, Serum: 172 mg/dL — ABNORMAL HIGH (ref 82–167)
Complement C4, Serum: 33 mg/dL (ref 14–44)

## 2018-03-15 LAB — ANA W/REFLEX IF POSITIVE: Anti Nuclear Antibody(ANA): NEGATIVE

## 2018-03-15 LAB — VITAMIN D 25 HYDROXY (VIT D DEFICIENCY, FRACTURES): Vit D, 25-Hydroxy: 22.9 ng/mL — ABNORMAL LOW (ref 30.0–100.0)

## 2018-03-20 MED ORDER — VITAMIN D (ERGOCALCIFEROL) 1.25 MG (50000 UNIT) PO CAPS: 50000.0000 [IU] | ORAL_CAPSULE | ORAL | 1 refills | Status: DC

## 2018-03-20 NOTE — Addendum Note (Signed)
Addended by: Scot Jun on: 03/20/2018 04:52 PM   Modules accepted: Orders

## 2018-03-22 LAB — LUPUS ANTICOAGULANT
Dilute Viper Venom Time: 40.7 s (ref 0.0–47.0)
PTT Lupus Anticoagulant: 46.2 s (ref 0.0–51.9)
Thrombin Time: 18.8 s (ref 0.0–23.0)
dPT Confirm Ratio: 0.94 Ratio (ref 0.00–1.40)
dPT: 47.8 s (ref 0.0–55.0)

## 2018-04-25 ENCOUNTER — Telehealth: Payer: Self-pay | Admitting: Family Medicine

## 2018-04-25 MED ORDER — LEVOTHYROXINE SODIUM 125 MCG PO TABS: 125.0000 ug | ORAL_TABLET | Freq: Every day | ORAL | 0 refills | Status: DC

## 2018-04-25 NOTE — Telephone Encounter (Signed)
Caller Name: Heather Campos   Reason for Call:  Medication refill levothyroxine (SYNTHROID, LEVOTHROID) 125 MCG tablet [[id number]]    If this is a medication request: confirm pharmacy  [place], [place] call back number: [phone]  Action taken by recipient of request:

## 2018-04-25 NOTE — Telephone Encounter (Signed)
Please advise.  This would be initial Rx under your name.

## 2018-04-25 NOTE — Telephone Encounter (Signed)
Rx sent 

## 2018-05-09 ENCOUNTER — Telehealth: Payer: Self-pay | Admitting: Family Medicine

## 2018-05-09 ENCOUNTER — Encounter: Payer: Self-pay | Admitting: Family Medicine

## 2018-05-09 NOTE — Telephone Encounter (Signed)
Left voice mail to call back 

## 2018-05-09 NOTE — Telephone Encounter (Signed)
Patient called requesting a call back from the nurse regarding her anxiety, patient scheduled appointment for Monday 03/23 and is aware that PCP does not prescribe xanax,  please follow up.

## 2018-05-09 NOTE — Telephone Encounter (Signed)
I will prescribe hydroxyzine until Monday

## 2018-05-09 NOTE — Telephone Encounter (Signed)
Patient called back.  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 She states that her nerves are shot, she can't sleep, she's loss her appetite. She was previously prescribed Valium by her old PCP. Advised her that that class of medication isn't prescribed here & that I would ask PCP if there was an alternative that could be prescribed until her appointment on Monday.

## 2018-05-10 NOTE — Telephone Encounter (Signed)
Left message on voicemail to return call.

## 2018-05-11 MED ORDER — HYDROXYZINE HCL 10 MG PO TABS: 10.0000 mg | ORAL_TABLET | Freq: Three times a day (TID) | ORAL | 0 refills | Status: DC | PRN

## 2018-05-11 NOTE — Telephone Encounter (Signed)
Pt name and DOB verified. Pt aware that medication was sent to pharmacy.  She continues to ask for Valium. She states she had taken this medication with her previous PCP. Pt was informed that this medication was not seen on her medication list.  She request a note out of work. Pt has an appointment on Monday at 3:15pm.

## 2018-05-14 ENCOUNTER — Ambulatory Visit: Payer: Medicaid Other | Admitting: Family Medicine

## 2018-05-14 ENCOUNTER — Encounter: Payer: Self-pay | Admitting: Family Medicine

## 2018-05-14 ENCOUNTER — Other Ambulatory Visit: Payer: Self-pay

## 2018-05-14 ENCOUNTER — Telehealth: Payer: Self-pay | Admitting: Family Medicine

## 2018-05-14 ENCOUNTER — Telehealth (INDEPENDENT_AMBULATORY_CARE_PROVIDER_SITE_OTHER): Payer: Self-pay | Admitting: Family Medicine

## 2018-05-14 DIAGNOSIS — F411 Generalized anxiety disorder: Secondary | ICD-10-CM

## 2018-05-14 DIAGNOSIS — F4329 Adjustment disorder with other symptoms: Secondary | ICD-10-CM

## 2018-05-14 DIAGNOSIS — Z1331 Encounter for screening for depression: Secondary | ICD-10-CM

## 2018-05-14 MED ORDER — HYDROXYZINE HCL 50 MG PO TABS: 50.0000 mg | ORAL_TABLET | Freq: Three times a day (TID) | ORAL | 1 refills | Status: DC | PRN

## 2018-05-14 MED ORDER — BUPROPION HCL ER (XL) 150 MG PO TB24: 150.0000 mg | ORAL_TABLET | Freq: Every day | ORAL | 1 refills | Status: DC

## 2018-05-14 NOTE — Patient Instructions (Signed)
Living With Anxiety  After being diagnosed with an anxiety disorder, you may be relieved to know why you have felt or behaved a certain way. It is natural to also feel overwhelmed about the treatment ahead and what it will mean for your life. With care and support, you can manage this condition and recover from it. How to cope with anxiety Dealing with stress Stress is your body's reaction to life changes and events, both good and bad. Stress can last just a few hours or it can be ongoing. Stress can play a major role in anxiety, so it is important to learn both how to cope with stress and how to think about it differently. Talk with your health care provider or a counselor to learn more about stress reduction. He or she may suggest some stress reduction techniques, such as:  Music therapy. This can include creating or listening to music that you enjoy and that inspires you.  Mindfulness-based meditation. This involves being aware of your normal breaths, rather than trying to control your breathing. It can be done while sitting or walking.  Centering prayer. This is a kind of meditation that involves focusing on a word, phrase, or sacred image that is meaningful to you and that brings you peace.  Deep breathing. To do this, expand your stomach and inhale slowly through your nose. Hold your breath for 3-5 seconds. Then exhale slowly, allowing your stomach muscles to relax.  Self-talk. This is a skill where you identify thought patterns that lead to anxiety reactions and correct those thoughts.  Muscle relaxation. This involves tensing muscles then relaxing them. Choose a stress reduction technique that fits your lifestyle and personality. Stress reduction techniques take time and practice. Set aside 5-15 minutes a day to do them. Therapists can offer training in these techniques. The training may be covered by some insurance plans. Other things you can do to manage stress include:  Keeping a  stress diary. This can help you learn what triggers your stress and ways to control your response.  Thinking about how you respond to certain situations. You may not be able to control everything, but you can control your reaction.  Making time for activities that help you relax, and not feeling guilty about spending your time in this way. Therapy combined with coping and stress-reduction skills provides the best chance for successful treatment. Medicines Medicines can help ease symptoms. Medicines for anxiety include:  Anti-anxiety drugs.  Antidepressants.  Beta-blockers. Medicines may be used as the main treatment for anxiety disorder, along with therapy, or if other treatments are not working. Medicines should be prescribed by a health care provider. Relationships Relationships can play a big part in helping you recover. Try to spend more time connecting with trusted friends and family members. Consider going to couples counseling, taking family education classes, or going to family therapy. Therapy can help you and others better understand the condition. How to recognize changes in your condition Everyone has a different response to treatment for anxiety. Recovery from anxiety happens when symptoms decrease and stop interfering with your daily activities at home or work. This may mean that you will start to:  Have better concentration and focus.  Sleep better.  Be less irritable.  Have more energy.  Have improved memory. It is important to recognize when your condition is getting worse. Contact your health care provider if your symptoms interfere with home or work and you do not feel like your condition is improving.  Where to find help and support: You can get help and support from these sources:  Self-help groups.  Online and [employer].  A trusted spiritual leader.  Couples counseling.  Family education classes.  Family therapy. Follow these instructions  at home:  Eat a healthy diet that includes plenty of vegetables, fruits, whole grains, low-fat dairy products, and lean protein. Do not eat a lot of foods that are high in solid fats, added sugars, or salt.  Exercise. Most adults should do the following: ? Exercise for at least 150 minutes each week. The exercise should increase your heart rate and make you sweat (moderate-intensity exercise). ? Strengthening exercises at least twice a week.  Cut down on caffeine, tobacco, alcohol, and other potentially harmful substances.  Get the right amount and quality of sleep. Most adults need 7-9 hours of sleep each night.  Make choices that simplify your life.  Take over-the-counter and prescription medicines only as told by your health care provider.  Avoid caffeine, alcohol, and certain over-the-counter cold medicines. These may make you feel worse. Ask your pharmacist which medicines to avoid.  Keep all follow-up visits as told by your health care provider. This is important. Questions to ask your health care provider  Would I benefit from therapy?  How often should I follow up with a health care provider?  How long do I need to take medicine?  Are there any long-term side effects of my medicine?  Are there any alternatives to taking medicine? Contact a health care provider if:  You have a hard time staying focused or finishing daily tasks.  You spend many hours a day feeling worried about everyday life.  You become exhausted by worry.  You start to have headaches, feel tense, or have nausea.  You urinate more than normal.  You have diarrhea. Get help right away if:  You have a racing heart and shortness of breath.  You have thoughts of hurting yourself or others. If you ever feel like you may hurt yourself or others, or have thoughts about taking your own life, get help right away. You can go to your nearest emergency department or call:  Your local emergency services  (911 in the [place]  A suicide crisis helpline, such as the [place] at (340) 260-7156. This is open 24-hours a day. Summary  Taking steps to deal with stress can help calm you.  Medicines cannot cure anxiety disorders, but they can help ease symptoms.  Family, friends, and partners can play a big part in helping you recover from an anxiety disorder. This information is not intended to replace advice given to you by your health care provider. Make sure you discuss any questions you have with your health care provider. Document Released: 02/02/2016 Document Revised: 02/02/2016 Document Reviewed: 02/02/2016 Elsevier Interactive Patient Education  2019 [employer] is a normal reaction to life events. Stress is what you feel when life demands more than you are used to, or more than you think you can handle. Some stress can be useful, such as studying for a test or meeting a deadline at work. Stress that occurs too often or for too long can cause problems. It can affect your emotional health and interfere with relationships and normal daily activities. Too much stress can weaken your body's defense system (immune system) and increase your risk for physical illness. If you already have a medical problem, stress can make it worse. What are the causes?  All sorts of life events can cause stress. An event that causes stress for one person may not be stressful for another person. Major life events, whether positive or negative, commonly cause stress. Examples include:  Losing a job or starting a new job.  Losing a loved one.  Moving to a new town or home.  Getting married or divorced.  Having a baby.  Injury or illness. Less obvious life events can also cause stress, especially if they occur day after day or in combination with each other. Examples include:  Working long hours.  Driving in traffic.  Caring for children.  Being in debt.   Being in a difficult relationship. What are the signs or symptoms? Stress can cause emotional symptoms, including:  Anxiety. This is feeling worried, afraid, on edge, overwhelmed, or out of control.  Anger, including irritation or impatience.  Depression. This is feeling sad, down, helpless, or guilty.  Trouble focusing, remembering, or making decisions. Stress can cause physical symptoms, including:  Aches and pains. These may affect your head, neck, back, stomach, or other areas of your body.  Tight muscles or a clenched jaw.  Low energy.  Trouble sleeping. Stress can cause unhealthy behaviors, including:  Eating to feel better (overeating) or skipping meals.  Working too much or putting off tasks.  Smoking, drinking alcohol, or using drugs to feel better. How is this diagnosed? Stress is diagnosed through an assessment by your health care provider. He or she may diagnose this condition based on:  Your symptoms and any stressful life events.  Your medical history.  Tests to rule out other causes of your symptoms. Depending on your condition, your health care provider may refer you to a specialist for further evaluation. How is this treated?  Stress management techniques are the recommended treatment for stress. Medicine is not typically recommended for the treatment of stress. Techniques to reduce your reaction to stressful life events include:  Stress identification. Monitor yourself for symptoms of stress and identify what causes stress for you. These skills may help you to avoid or prepare for stressful events.  Time management. Set your priorities, keep a calendar of events, and learn to say "no." Taking these actions can help you avoid making too many commitments. Techniques for coping with stress include:  Rethinking the problem. Try to think realistically about stressful events rather than ignoring them or overreacting. Try to find the positives in a stressful  situation rather than focusing on the negatives.  Exercise. Physical exercise can release both physical and emotional tension. The key is to find a form of exercise that you enjoy and do it regularly.  Relaxation techniques. These relax the body and mind. The key is to find one or more that you enjoy and use the technique(s) regularly. Examples include: ? Meditation, deep breathing, or progressive relaxation techniques. ? Yoga or tai chi. ? Biofeedback, mindfulness techniques, or journaling. ? Listening to music, being out in nature, or participating in other hobbies.  Practicing a healthy lifestyle. Eat a balanced diet, drink plenty of water, limit or avoid caffeine, and get plenty of sleep.  Having a strong support network. Spend time with family, friends, or other people you enjoy being around. Express your feelings and talk things over with someone you trust. Counseling or talk therapy with a mental health professional may be helpful if you are having trouble managing stress on your own. Follow these instructions at home: Lifestyle   Avoid drugs.  Do not use any  products that contain nicotine or tobacco, such as cigarettes and e-cigarettes. If you need help quitting, ask your health care provider.  Limit alcohol intake to no more than 1 drink a day for nonpregnant women and 2 drinks a day for men. One drink equals 12 oz of beer, 5 oz of wine, or 1 oz of hard liquor.  Do not use alcohol or drugs to relax.  Eat a balanced diet that includes fresh fruits and vegetables, whole grains, lean meats, fish, eggs, and beans, and low-fat dairy. Avoid processed foods and foods high in added fat, sugar, and salt.  Exercise at least 30 minutes on 5 or more days each week.  Get 7-8 hours of sleep each night. General instructions   Practice stress management techniques as discussed with your health care provider.  Drink enough fluid to keep your urine clear or pale yellow.  Take  over-the-counter and prescription medicines only as told by your health care provider.  Keep all follow-up visits as told by your health care provider. This is important. Contact a health care provider if:  Your symptoms get worse.  You have new symptoms.  You feel overwhelmed by your problems and can no longer manage them on your own. Get help right away if:  You have thoughts of hurting yourself or others. If you ever feel like you may hurt yourself or others, or have thoughts about taking your own life, get help right away. You can go to your nearest emergency department or call:  Your local emergency services (911 in the [place]  A suicide crisis helpline, such as the [facility] at (682)001-7276. This is open 24 hours a day. Summary  Stress is a normal reaction to life events. It can cause problems if it happens too often or for too long.  Practicing stress management techniques is the best way to treat stress.  Counseling or talk therapy with a mental health professional may be helpful if you are having trouble managing stress on your own. This information is not intended to replace advice given to you by your health care provider. Make sure you discuss any questions you have with your health care provider. Document Released: 08/03/2000 Document Revised: 03/30/2016 Document Reviewed: 03/30/2016 Elsevier Interactive Patient Education  2019 [employer].

## 2018-05-14 NOTE — Telephone Encounter (Signed)
Please fax letter with a confidential fax cover sheet to patient employer Attention: [employer]. Fax number [phone].

## 2018-05-14 NOTE — Progress Notes (Signed)
Patient ID: Heather Campos, female    DOB: November 11, 1975, 43 y.o.   MRN: [id number]  PCP: Scot Jun, FNP  CC: Stress and Anxiety about Coronavirus   Subjective:  HPI Heather Campos is a 43 y.o. female has provided verbal consent to complete today's encounter via telephonic encounter.   Patient is current in transit during this encounter, and has the call on speaker to facilitate safety while driving.   Provider is in practice's office.   CMA completed Gad 7 and PHQ 9 verbally with patient PHQ 9 screening =17 and GAD 7=15  Patient reports a history of anxiety associated with flying and previous PCP would prescribe her valium for anxiety. She had a few doses let and now she out of medication. No other prior treatment or evaluation for anxiety or depression. Current symptoms include fear of contracting the Coronavirus, fear of dying from the Coronarvirus, insomnia (multiple awakenings at night), worry, and unable to perform optimally at work. Patient works at a childcare facility and fears she will contract the virus from one of children because several of the children are sick with runny noses and cough. There have been no know cases associated with her place of employment. She has been watching TV and feels she can't escape the extensive news coverage. She endorses reading the bible to cope with anxiety and depression. She is having a difficult time feeling motivated to get out of bed to get her day started. Denies suicidal ideations, homicidal ideations, or auditory hallucinations.  Social History   Socioeconomic History  . Marital status: Married    Spouse name: Not on file  . Number of children: Not on file  . Years of education: Not on file  . Highest education level: Not on file  Occupational History  . Not on file  Social Needs  . Financial resource strain: Not on file  . Food insecurity:    Worry: Not on file    Inability: Not on file  . Transportation needs:     Medical: Not on file    Non-medical: Not on file  Tobacco Use  . Smoking status: Former Research scientist (life sciences)  . Smokeless tobacco: Never Used  Substance and Sexual Activity  . Alcohol use: No    Frequency: Never  . Drug use: No  . Sexual activity: Yes    Birth control/protection: None  Lifestyle  . Physical activity:    Days per week: Not on file    Minutes per session: Not on file  . Stress: Not on file  Relationships  . Social connections:    Talks on phone: Not on file    Gets together: Not on file    Attends religious service: Not on file    Active member of club or organization: Not on file    Attends meetings of clubs or organizations: Not on file    Relationship status: Not on file  . Intimate partner violence:    Fear of current or ex partner: Not on file    Emotionally abused: Not on file    Physically abused: Not on file    Forced sexual activity: Not on file  Other Topics Concern  . Not on file  Social History Narrative  . Not on file    Family History  Problem Relation Age of Onset  . Lupus Mother   . Kidney disease Mother   . Lupus Cousin    Review of Systems  Pertinent negatives listed in HPI  No physical  exam-encounter completed via telephone  Prior to Admission medications   Medication Sig Start Date End Date Taking? Authorizing Provider  buPROPion (WELLBUTRIN XL) 150 MG 24 hr tablet Take 1 tablet (150 mg total) by mouth daily. 05/14/18   Scot Jun, FNP  Cholecalciferol (DIALYVITE VITAMIN D3 MAX) 1.25 MG (50000 UT) TABS Take 1 tablet by mouth 2 (two) times a week.    [provider]  ferrous sulfate 325 (65 FE) MG tablet Take 1 tablet by mouth 2 (two) times daily. 01/09/18   [provider]  folic acid (FOLVITE) 1 MG tablet Take 1 tablet by mouth daily. 01/09/18   [provider]  hydrOXYzine (ATARAX/VISTARIL) 50 MG tablet Take 1 tablet (50 mg total) by mouth 3 (three) times daily as needed for anxiety. 05/14/18   Scot Jun, FNP  levothyroxine (SYNTHROID, LEVOTHROID) 125 MCG tablet Take 1 tablet (125 mcg total) by mouth daily. 04/25/18   Scot Jun, FNP  Vitamin D, Ergocalciferol, (DRISDOL) 1.25 MG (50000 UT) CAPS capsule Take 1 capsule (50,000 Units total) by mouth every 7 (seven) days. 03/20/18   Scot Jun, FNP   Past Medical, Surgical Family and Social History reviewed and updated.   Assessment & Plan:  1. Anxiety state 2. Positive depression screening 3. Stress and adjustment reaction Likely symptoms are situational, positive GAD-7 and PHQ-9 . History of anxiety associated with flying, only. -Encouraged to develop distracting techniques: example bible reading, relaxation, deep breathing, and walking -Encouraged to "turn off the television" and "turn off social media", these sources of media are likely exacerbating symptoms of anxiety. -Patient insistent that Valium is the only medication that works for her, however, she has not tried other medications. "Advise of that I do not prescribe  Benzodiazapine for anxiety management". -Will increase hydroxyzine 50 mg TID -Trial Wellbutrin 150 mg once daily, will titrate to 300 mg if symptoms so not improve. -Scheduled an appointment with LCSW, for 05/22/18. -Referring to [facility] per patient request for additional management.  A total of 20 minutes spent, greater than 50 % of this time was spent obtaining a detailed history, identify symptoms, causes of symptoms, discussing coping strategies,  and coordination of care.  -The patient was given clear instructions to go to ER or return to medical center if symptoms do not improve, worsen or new problems develop. The patient verbalized understanding.    Molli Barrows, FNP Primary Care at [facility] [address] [place] [phone]: [phone]

## 2018-05-15 NOTE — Telephone Encounter (Signed)
Letter was faxed.

## 2018-05-21 ENCOUNTER — Encounter: Payer: Self-pay | Admitting: Family Medicine

## 2018-05-22 ENCOUNTER — Other Ambulatory Visit: Payer: Self-pay

## 2018-05-22 ENCOUNTER — Institutional Professional Consult (permissible substitution): Payer: Medicaid Other | Admitting: Licensed Clinical Social Worker

## 2018-05-23 ENCOUNTER — Ambulatory Visit: Payer: Self-pay | Admitting: Licensed Clinical Social Worker

## 2018-05-24 ENCOUNTER — Other Ambulatory Visit: Payer: Self-pay

## 2018-05-24 ENCOUNTER — Telehealth: Payer: Self-pay | Admitting: Family Medicine

## 2018-05-24 ENCOUNTER — Ambulatory Visit: Payer: Self-pay | Attending: Family Medicine | Admitting: Licensed Clinical Social Worker

## 2018-05-24 ENCOUNTER — Encounter: Payer: Self-pay | Admitting: Family Medicine

## 2018-05-24 DIAGNOSIS — F419 Anxiety disorder, unspecified: Secondary | ICD-10-CM

## 2018-05-24 NOTE — Telephone Encounter (Signed)
Letter faxed. Awaiting confirmation.

## 2018-05-24 NOTE — Telephone Encounter (Signed)
Patient replied with an email address that I can fax the work note to([email or web address]). Work note scanned to email address.

## 2018-05-24 NOTE — Telephone Encounter (Signed)
Received another communication error. Will send patient a MyChart message to see if there is an alternate fax number I can send her work note to.

## 2018-05-24 NOTE — Telephone Encounter (Signed)
Please re-fax letter from [place] dated 05/14/18 with a confidential fax cover sheet to patient employer Attention: [employer]. Fax number [phone].  Please bring confirmation to me once faxed.  Thanks,  Molli Barrows, FNP

## 2018-05-24 NOTE — BH Specialist Note (Signed)
Integrated Behavioral Health Visit via Telemedicine (Telephone)  05/24/2018 [place] [id number]   Session Start time: 9:00 AM  Session End time: 9:30 AM Total time: 30 minutes  Referring Provider: FNP Harris Type of Visit: Telephonic Patient location: Home [facility] Provider location: Office All persons participating in visit: Pt  Confirmed patient's address: Yes  Confirmed patient's phone number: Yes  Any changes to demographics: No   Confirmed patient's insurance: Yes  Any changes to patient's insurance: No   Discussed confidentiality: Yes    The following statements were read to the patient and/or legal guardian that are established with the [facility] Provider.  "The purpose of this phone visit is to provide behavioral health care while limiting exposure to the coronavirus (COVID19).  There is a possibility of technology failure and discussed alternative modes of communication if that failure occurs."  "By engaging in this telephone visit, you consent to the provision of healthcare.  Additionally, you authorize for your insurance to be billed for the services provided during this telephone visit."   Patient and/or legal guardian consented to telephone visit: Yes   PRESENTING CONCERNS: Patient and/or family reports the following symptoms/concerns: Pt reports difficulty managing anxiety. She has experienced an increase in panic attacks and decreased functioning at work due to fear of being diagnosed with COVID-19 Duration of problem: 2 weeks; Severity of problem: moderate  STRENGTHS (Protective Factors/Coping Skills): Pt has a strong support system Pt has ability to advocate for self Pt is compliant with medication management  GOALS ADDRESSED: Patient will: 1.  Reduce symptoms of: anxiety and stress  2.  Increase knowledge and/or ability of: coping skills  3.  Demonstrate ability to: Increase healthy adjustment to current life circumstances and Increase adequate  support systems for patient/family  INTERVENTIONS: Interventions utilized:  Mindfulness or Psychologist, educational, Supportive Counseling and Psychoeducation and/or Health Education Standardized Assessments completed: Not Needed  ASSESSMENT: Patient currently experiencing anxiety triggered by stress. She has experienced an increase in panic attacks and decreased functioning at work due to fear of being diagnosed with COVID-19. Denies suicidal/homicidal ideations. Receives strong support from family.   Patient may benefit from psychoeducation and psychotherapy. She participates in medication management through PCP. LCSWA educated pt on the correlation between one's physical and mental health, in addition, to how stress can negatively impact health. Healthy coping skills were discussed to manage stressors and decrease symptoms. Supportive resources for practicing mindfulness/meditation were provided.   PLAN: 1. Follow up with behavioral health clinician on : Pt was encouraged to contact [place] if symptoms worsen or fail to improve to schedule behavioral appointments at [facility]. 2. Behavioral recommendations: LCSWA recommends that pt apply healthy coping skills discussed and continue to comply with medication managment. Pt is encouraged to schedule follow up appointment with LCSWA 3. Referral(s): [employer] (In Clinic)  Rebekah [place], [place] 05/24/2018 3:43 PM

## 2018-05-24 NOTE — Telephone Encounter (Signed)
Communication error. Letter refaxed.

## 2018-06-13 ENCOUNTER — Other Ambulatory Visit: Payer: Self-pay

## 2018-06-13 ENCOUNTER — Ambulatory Visit: Payer: Self-pay | Admitting: Family Medicine

## 2018-06-13 ENCOUNTER — Encounter: Payer: Self-pay | Admitting: Family Medicine

## 2018-06-13 NOTE — Progress Notes (Signed)
Patient was scheduled for CPE which will require face to face. Due to COVID deferring appointment to June. No charge for encounter

## 2018-06-13 NOTE — Progress Notes (Deleted)
Taking two type of vitamin D tablets   Addresses no concerns

## 2018-07-04 ENCOUNTER — Encounter: Payer: Self-pay | Admitting: Family Medicine

## 2018-07-31 ENCOUNTER — Telehealth: Payer: Self-pay

## 2018-07-31 NOTE — Telephone Encounter (Signed)
Called patient to do their pre-visit COVID screening.  Have you recently traveled internationally([place], [place], [place], [place], [place]) or within the [place] to a hotspot area([place], [place], [place], [place], [place])? no  Are you currently experiencing any of the following: fever, cough, SHOB, fatigue, body aches, loss of smell, rash, diarrhea, vomiting, severe headaches, weakness, sore throat? no  Have you been in contact with anyone who has recently travelled? no  Have you been in contact with anyone who is experiencing any of the above symptoms or been diagnosed with [place]  or works in or has recently visited a SNF? No  Reminded patient to come in fasting for CPE labs.

## 2018-08-01 ENCOUNTER — Other Ambulatory Visit: Payer: Self-pay

## 2018-08-01 ENCOUNTER — Encounter: Payer: Self-pay | Admitting: Family Medicine

## 2018-08-01 ENCOUNTER — Ambulatory Visit (INDEPENDENT_AMBULATORY_CARE_PROVIDER_SITE_OTHER): Payer: Self-pay | Admitting: Family Medicine

## 2018-08-01 VITALS — BP 120/83 | HR 89 | Temp 97.7°F | Resp 17 | Ht 67.0 in | Wt 249.0 lb

## 2018-08-01 DIAGNOSIS — D508 Other iron deficiency anemias: Secondary | ICD-10-CM

## 2018-08-01 DIAGNOSIS — Z1389 Encounter for screening for other disorder: Secondary | ICD-10-CM

## 2018-08-01 DIAGNOSIS — E559 Vitamin D deficiency, unspecified: Secondary | ICD-10-CM

## 2018-08-01 DIAGNOSIS — D509 Iron deficiency anemia, unspecified: Secondary | ICD-10-CM

## 2018-08-01 DIAGNOSIS — E063 Autoimmune thyroiditis: Secondary | ICD-10-CM

## 2018-08-01 DIAGNOSIS — E7800 Pure hypercholesterolemia, unspecified: Secondary | ICD-10-CM

## 2018-08-01 DIAGNOSIS — Z840 Family history of diseases of the skin and subcutaneous tissue: Secondary | ICD-10-CM

## 2018-08-01 DIAGNOSIS — Z3202 Encounter for pregnancy test, result negative: Secondary | ICD-10-CM

## 2018-08-01 DIAGNOSIS — E038 Other specified hypothyroidism: Secondary | ICD-10-CM

## 2018-08-01 LAB — POCT URINALYSIS DIP (CLINITEK)
Bilirubin, UA: NEGATIVE
Glucose, UA: NEGATIVE mg/dL
Ketones, POC UA: NEGATIVE mg/dL
Leukocytes, UA: NEGATIVE
Nitrite, UA: NEGATIVE
POC PROTEIN,UA: NEGATIVE
Spec Grav, UA: 1.025 (ref 1.010–1.025)
Urobilinogen, UA: 0.2 E.U./dL
pH, UA: 6 (ref 5.0–8.0)

## 2018-08-01 LAB — POCT URINE PREGNANCY: Preg Test, Ur: NEGATIVE

## 2018-08-01 MED ORDER — OMEPRAZOLE 40 MG PO CPDR: 40.0000 mg | DELAYED_RELEASE_CAPSULE | Freq: Every day | ORAL | 1 refills | Status: DC | PRN

## 2018-08-01 MED ORDER — FERROUS SULFATE 325 (65 FE) MG PO TABS: 325.0000 mg | ORAL_TABLET | Freq: Two times a day (BID) | ORAL | 3 refills | Status: DC

## 2018-08-01 MED ORDER — FOLIC ACID 1 MG PO TABS: 1.0000 mg | ORAL_TABLET | Freq: Every day | ORAL | 1 refills | Status: DC

## 2018-08-01 MED ORDER — LEVOTHYROXINE SODIUM 125 MCG PO TABS: 125.0000 ug | ORAL_TABLET | Freq: Every day | ORAL | 1 refills | Status: DC

## 2018-08-01 NOTE — Progress Notes (Signed)
Patient ID: Heather Campos, female    DOB: Feb 24, 1975, 43 y.o.   MRN: [id number]  PCP: Scot Jun, FNP  Chief Complaint  Patient presents with  . Hypothyroidism  . 3 month Lupus screening  . vitamin D Deficiency    Subjective:  HPI Heather Campos is a 43 y.o. female presents for chronic conditions follow-up.  Heather Campos has Vitamin D deficiency; OSA (obstructive sleep apnea); Morbid obesity due to excess calories ([place]); Iron deficiency anemia due to chronic blood loss; Family history of systemic lupus erythematosus; and Hypothyroidism due to Hashimoto's thyroiditis on their problem list.   Elevated LDL-lipid panel checked in January 2020. LDL 106. Patient is obese, non-smoker. No current statin therapy. She is inactive of routine physical exercise.  Family history of Lupus (Mother), patient routinely, every 3-6 months requests, ANA, C3 and , and lupus anticoagulant labs to evaluate for possible lupus. Previous provider screened quarterly and patient has not had any abnormal or concerning results. Negative for new rashes, arthralgias , renal impairment, and fatigue.  Hypothyroidism-Currently prescribed levothyroxine 125 mcg daily. Asymptomatic.  Iron deficiency-Recent iron panel 1/20, normal iron level. She takes iron replacement 325 mg twice daily when she remembers. Recent hemoglobin 10.9  In 1/20. Social History   Socioeconomic History  . Marital status: Married    Spouse name: Not on file  . Number of children: Not on file  . Years of education: Not on file  . Highest education level: Not on file  Occupational History  . Not on file  Social Needs  . Financial resource strain: Not on file  . Food insecurity:    Worry: Not on file    Inability: Not on file  . Transportation needs:    Medical: Not on file    Non-medical: Not on file  Tobacco Use  . Smoking status: Former Research scientist (life sciences)  . Smokeless tobacco: Never Used  Substance and Sexual Activity  . Alcohol use: No     Frequency: Never  . Drug use: No  . Sexual activity: Yes    Birth control/protection: None  Lifestyle  . Physical activity:    Days per week: Not on file    Minutes per session: Not on file  . Stress: Not on file  Relationships  . Social connections:    Talks on phone: Not on file    Gets together: Not on file    Attends religious service: Not on file    Active member of club or organization: Not on file    Attends meetings of clubs or organizations: Not on file    Relationship status: Not on file  . Intimate partner violence:    Fear of current or ex partner: Not on file    Emotionally abused: Not on file    Physically abused: Not on file    Forced sexual activity: Not on file  Other Topics Concern  . Not on file  Social History Narrative  . Not on file    Family History  Problem Relation Age of Onset  . Lupus Mother   . Kidney disease Mother   . Lupus Cousin    Review of Systems  Pertinent negatives listed in HPI  No Known Allergies  Prior to Admission medications   Medication Sig Start Date End Date Taking? Authorizing Provider  ferrous sulfate 325 (65 FE) MG tablet Take 1 tablet by mouth 2 (two) times daily. 01/09/18  Yes [provider]  folic acid (FOLVITE) 1 MG tablet Take 1  tablet by mouth daily. 01/09/18  Yes [provider]  levothyroxine (SYNTHROID, LEVOTHROID) 125 MCG tablet Take 1 tablet (125 mcg total) by mouth daily. 04/25/18  Yes Scot Jun, FNP  omeprazole (PRILOSEC) 40 MG capsule Take 1 capsule by mouth daily as needed for heartburn. 05/11/18  Yes [provider]  Vitamin D, Ergocalciferol, (DRISDOL) 1.25 MG (50000 UT) CAPS capsule Take 1 capsule (50,000 Units total) by mouth every 7 (seven) days. 03/20/18  Yes Scot Jun, FNP    Past Medical, Surgical Family and Social History reviewed and updated.    Objective:   Today's Vitals   08/01/18 0916  BP: 120/83  Pulse: 89  Resp: 17  Temp: 97.7 F (36.5 C)   TempSrc: Temporal  SpO2: 98%  Weight: 249 lb (112.9 kg)  Height: 5\' 7"  (1.702 m)    BP Readings from Last 3 Encounters:  08/01/18 120/83  03/14/18 126/75  09/22/17 137/85    Filed Weights   08/01/18 0916  Weight: 249 lb (112.9 kg)       Physical Exam General appearance: alert, well developed, well nourished, cooperative and in no distress Head: Normocephalic, without obvious abnormality, atraumatic Respiratory: Respirations even and unlabored, normal respiratory rate Heart: rate and rhythm normal. No gallop or murmurs noted on exam  Abdomen: BS +, no distention, no rebound tenderness, or no mass Extremities: No gross deformities Skin: Skin color, texture, turgor normal. No rashes seen  Psych: Appropriate mood and affect. Neurologic: Mental status: Alert, oriented to person, place, and time, thought content appropriate. Lab Results  Component Value Date   HGBA1C 5.5 03/14/2018    Assessment & Plan:  1. Screening for blood or protein in urine - POCT URINALYSIS DIP (CLINITEK) - POCT urine pregnancy  2. Family history of lupus erythematosus -Advised patient of the non-necessity to check these labs every 3 months..  She was in agreement to follow-up in 6 months biannually to have these labs screened. - C3 and C4 - ANA, IFA (with reflex) - Lupus anticoagulant  3. Hypothyroidism due to Hashimoto's thyroiditis -Continue levothyroxine 125 mcg.  We will repeat a thyroid panel.  If abnormal we will contact you to make adjustments to levothyroxine. - Thyroid Panel With TSH  4. Vitamin D deficiency, continue vitamin D replacement.  Will check a vitamin D level today if abnormally high or low will advise whether to discontinue or continue medication. - Vitamin D, 25-hydroxy  5. Elevated LDL cholesterol level -We will check a cholesterol panel to reevaluate LDLs.  If LDLs have increased significantly we will discuss possibly starting statin therapy or simply recommending  lifestyle changes.  However I do encourage you to increase vegetable and fruit intake as well as physical activity to total 150 minutes/week. - Lipid panel   Return for follow-up in 6 months, CPE    Molli Barrows, FNP Primary Care at [facility] [address], [place] [phone]: [phone]

## 2018-08-01 NOTE — Patient Instructions (Addendum)
Heart Disease Prevention Heart disease is the leading cause of death in the world. Coronary artery disease is the most common cause of heart disease. This condition results when cholesterol and other substances (plaque) build up inside the walls of the blood vessels that supply your heart muscle (arteries). This buildup in arteries is called atherosclerosis. You can take actions to lower your risk of heart disease. How can heart disease affect me? Heart disease can cause many unpleasant symptoms and complications, such as:  Chest pain (angina).  Reduced or blocked blood flow to your heart. This can cause: ? Irregular heartbeats (arrhythmias). ? Heart attack. ? Heart failure. What can increase my risk? The following factors may make you more likely to develop this condition:  High blood pressure (hypertension).  High cholesterol.  Smoking.  A diet high in saturated fats or trans fats.  Lack of physical activity.  Obesity.  Drinking too much alcohol.  Diabetes.  Having a family history of heart disease. What actions can I take to prevent heart disease? Nutrition   Eat a heart-healthy eating plan as told by your health care provider. Examples include the DASH (Dietary Approaches to Stop Hypertension) eating plan or the Mediterranean diet.  Generally, it is recommended that you: ? Eat less salt (sodium). Ask your health care provider how much sodium is safe for you. Most people should have less than 2,300 mg each day. ? Limit unhealthy fats, such as saturated and trans fats, in your diet. You can do this by eating low-fat dairy products, eating less red meat, and avoiding processed foods. ? Eat healthy fats (omega-3 fatty acids). These are found in fish, such as mackerel or salmon. ? Eat more fruits and vegetables. You should try to fill one-half of your plate with fruits and vegetables at each meal. ? Eat more whole grains. ? Avoid foods and drinks that have added  sugars. Lifestyle   Get regular exercise. This is one of the most important things you can do for your health. Generally, it is recommended that you: ? Exercise for at least 30 minutes on most days of the week (150 minutes each week). The exercise should increase your heart rate and make you sweat (aerobic exercise). ? Add strength exercises on at least 2 days each week.  Do not use any products that contain nicotine or tobacco, such as cigarettes and e-cigarettes. These can damage your heart and blood vessels. If you need help quitting, ask your health care provider. Alcohol use  Do not drink alcohol if: ? Your health care provider tells you not to drink. ? You are pregnant, may be pregnant, or are planning to become pregnant.  If you drink alcohol, limit how much you have: ? 0-1 drink a day for women. ? 0-2 drinks a day for men.  Be aware of how much alcohol is in your drink. In the [place] one drink equals one typical bottle of beer (12 oz), one-half glass of wine (5 oz), or one shot of hard liquor (1 oz). Medicines  Take over-the-counter and prescription medicines only as told by your health care provider.  Ask your health care provider whether you should take an aspirin every day. Taking aspirin may help reduce your risk of heart disease and stroke.  Depending on your risk factors, your health care provider may prescribe medicines to lower your risk of heart disease or to control related conditions. You may take medicine to: ? Lower cholesterol. ? Control blood pressure. ? Control   reduce your risk of heart disease and stroke.   Depending on your risk factors, your health care provider may prescribe medicines to lower your risk of heart disease or to control related conditions. You may take medicine to:  ? Lower cholesterol.  ? Control blood pressure.  ? Control diabetes.  General information   Keep your blood pressure under control, as recommended by your health care provider. For most healthy people, the upper number of your blood pressure (systolic) should be no higher than 120, and the lower number (diastolic) no higher than 80. Treatment may be needed if your blood pressure is higher than 130/80.   Have your blood pressure checked at least every two years. Your  health care provider may check your blood pressure more often if you have high blood pressure.   After age 20, have your cholesterol checked every 4-6 years. If you have risk factors for heart disease, you may need to have it checked more frequently. Treatment may be needed if your cholesterol is high.   Have your body mass index (BMI) checked every year. Your health care provider can calculate your BMI from your height and weight.   Work with your health care provider to lose weight, if needed, or to maintain a healthy weight.  Where to find more information:   Centers for Disease Control and Prevention: www.cdc.gov/heartdisease   American Heart Association: www.heart.org  ? Take a free online heart disease risk quiz to better understand your personal risk factors.  Summary   Heart disease is the leading cause of death in the world.   Heart disease can cause chest pain, abnormal heart rhythms, heart attack, and heart failure.   High blood pressure, high cholesterol, and smoking are the main risk factors for heart disease, although other factors also contribute.   You can take actions to lower your chances of developing heart disease. Work with your health care provider to reduce your risk by following a heart-healthy diet, being physically active, and controlling your weight, blood pressure, and cholesterol level.  This information is not intended to replace advice given to you by your health care provider. Make sure you discuss any questions you have with your health care provider.  Document Released: 09/22/2003 Document Revised: 02/22/2017 Document Reviewed: 02/22/2017  Elsevier Interactive Patient Education  2019 [employer]

## 2018-08-02 LAB — VITAMIN D 25 HYDROXY (VIT D DEFICIENCY, FRACTURES): Vit D, 25-Hydroxy: 32.1 ng/mL (ref 30.0–100.0)

## 2018-08-02 LAB — LIPID PANEL
Chol/HDL Ratio: 2.7 ratio (ref 0.0–4.4)
Cholesterol, Total: 170 mg/dL (ref 100–199)
HDL: 62 mg/dL (ref >39)
LDL Calculated: 90 mg/dL (ref 0–99)
Triglycerides: 89 mg/dL (ref 0–149)
VLDL Cholesterol Cal: 18 mg/dL (ref 5–40)

## 2018-08-02 LAB — THYROID PANEL WITH TSH
Free Thyroxine Index: 1.6 (ref 1.2–4.9)
T3 Uptake Ratio: 22 % — ABNORMAL LOW (ref 24–39)
T4, Total: 7.2 ug/dL (ref 4.5–12.0)
TSH: 1.92 u[IU]/mL (ref 0.450–4.500)

## 2018-08-02 LAB — C3 AND C4
Complement C3, Serum: 183 mg/dL — ABNORMAL HIGH (ref 82–167)
Complement C4, Serum: 32 mg/dL (ref 14–44)

## 2018-08-02 LAB — ANTINUCLEAR ANTIBODIES, IFA: ANA Titer 1: NEGATIVE

## 2018-08-03 LAB — LUPUS ANTICOAGULANT
Dilute Viper Venom Time: 38 s (ref 0.0–47.0)
PTT Lupus Anticoagulant: 41 s (ref 0.0–51.9)
Thrombin Time: 16.2 s (ref 0.0–23.0)
dPT Confirm Ratio: 1.04 Ratio (ref 0.00–1.40)
dPT: 42.9 s (ref 0.0–55.0)

## 2018-08-20 ENCOUNTER — Telehealth: Payer: Self-pay | Admitting: Family Medicine

## 2018-08-20 NOTE — Telephone Encounter (Signed)
Patient wants to go to a sleep apnea doctor.

## 2018-08-20 NOTE — Telephone Encounter (Signed)
Schedule a virtual visit for evaluation of sleep apnea.

## 2018-08-22 ENCOUNTER — Telehealth (INDEPENDENT_AMBULATORY_CARE_PROVIDER_SITE_OTHER): Payer: Medicaid Other | Admitting: Family Medicine

## 2018-08-22 DIAGNOSIS — Z6841 Body Mass Index (BMI) 40.0 and over, adult: Secondary | ICD-10-CM

## 2018-08-22 DIAGNOSIS — Z9989 Dependence on other enabling machines and devices: Secondary | ICD-10-CM

## 2018-08-22 DIAGNOSIS — G4733 Obstructive sleep apnea (adult) (pediatric): Secondary | ICD-10-CM

## 2018-08-22 NOTE — Progress Notes (Deleted)
Worked up patient for her [place] visit with provider Molli Barrows, FNP-C. She states that she has had her current CPAP machine about 5 years. Needs a new machine & CPAP supplies.

## 2018-08-22 NOTE — Progress Notes (Signed)
Virtual Visit via Telephone Note  I connected with Heather Campos on 08/22/18 at 10:50 AM EDT by telephone and verified that I am speaking with the correct person using two identifiers.  Location: Patient: Located at home during today's encounter  Provider: Located at primary care office     I discussed the limitations, risks, security and privacy concerns of performing an evaluation and management service by telephone and the availability of in person appointments. I also discussed with the patient that there may be a patient responsible charge related to this service. The patient expressed understanding and agreed to proceed.   Lekesha Claw has Vitamin D deficiency; OSA (obstructive sleep apnea); Morbid obesity due to excess calories ([place]); Iron deficiency anemia due to chronic blood loss; Family history of systemic lupus erythematosus; and Hypothyroidism due to Hashimoto's thyroiditis on their problem list.   History of Present Illness: Hedi is present during today' telemedicine encounter requesting a referral to sleep medicine. She was diagnosed with OSA in January 2018 and prescribed CPAP machine. She was previously followed by Dr. Doreatha Lew at [facility] in [place]. She reports today, that her CPAP machine is not operating properly and she is not achieving the optimal amount of benefit as previous with her machine. She is concern that she may need wither a new machine or new sleep study. In review of EMR patient has gained almost 20 lbs since prior study was completed in 02/2016. She denies any excessive daytime sleepiness or excessive fatigue.    Assessment and Plan: 1. OSA on CPAP -Referral to neurology sleep medicine for second opinion of CPAP settings and whether or not patient warrants a new sleep study. She has sustain nearly an 20 lbs weight gain since her prior sleep study in 2018.   2. Morbid obesity due to excess calories ([place]) Encouraged  efforts to reduce weight include engaging in physical activity as tolerated with goal of 150 minutes per week. Improve dietary choices and eat a meal regimen consistent with a Mediterranean or DASH diet. Reduce simple carbohydrates. Do not skip meals and eat healthy snacks throughout the day to avoid over-eating at dinner. Set a goal weight loss that is achievable for you.  Follow Up Instructions: Keep scheduled follow-up on file    I discussed the assessment and treatment plan with the patient. The patient was provided an opportunity to ask questions and all were answered. The patient agreed with the plan and demonstrated an understanding of the instructions.   The patient was advised to call back or seek an in-person evaluation if the symptoms worsen or if the condition fails to improve as anticipated.  I provided 20 minutes of non-face-to-face time during this encounter.   Molli Barrows, FNP

## 2018-08-28 ENCOUNTER — Encounter: Payer: Self-pay | Admitting: Family Medicine

## 2018-09-04 ENCOUNTER — Encounter: Payer: Self-pay | Admitting: Neurology

## 2018-09-04 ENCOUNTER — Other Ambulatory Visit: Payer: Self-pay

## 2018-09-04 ENCOUNTER — Ambulatory Visit (INDEPENDENT_AMBULATORY_CARE_PROVIDER_SITE_OTHER): Payer: BLUE CROSS/BLUE SHIELD | Admitting: Neurology

## 2018-09-04 VITALS — BP 149/94 | HR 99 | Ht 64.0 in | Wt 252.0 lb

## 2018-09-04 DIAGNOSIS — G4733 Obstructive sleep apnea (adult) (pediatric): Secondary | ICD-10-CM

## 2018-09-04 DIAGNOSIS — R635 Abnormal weight gain: Secondary | ICD-10-CM

## 2018-09-04 DIAGNOSIS — R351 Nocturia: Secondary | ICD-10-CM

## 2018-09-04 DIAGNOSIS — Z6841 Body Mass Index (BMI) 40.0 and over, adult: Secondary | ICD-10-CM

## 2018-09-04 DIAGNOSIS — G4719 Other hypersomnia: Secondary | ICD-10-CM | POA: Diagnosis not present

## 2018-09-04 DIAGNOSIS — Z820 Family history of epilepsy and other diseases of the nervous system: Secondary | ICD-10-CM

## 2018-09-04 NOTE — Progress Notes (Signed)
Subjective:    Patient ID: Heather Campos is a 43 y.o. female.  HPI     Star Age, MD, PhD [facility] [address], [address] P.O. [place], [place] [address]   Dear Joelene Millin,   I saw your patient, Heather Campos, upon your kind request to my sleep clinic today for initial consultation of her sleep disorder, in particular, evaluation of her prior diagnosis of obstructive sleep apnea.  The patient is unaccompanied today.  As you know, Heather Campos is a 43 year old right-handed woman with an underlying medical history of vitamin D deficiency, obesity, hypothyroidism with history of Hashimoto's thyroiditis, reflux disease, chronic back pain, allergic rhinitis and anemia, who was previously diagnosed with obstructive sleep apnea.  She had sleep study testing in [place], [place].  I was able to review her sleep study from 06/21/2016 which showed an AHI of 7.4/h, O2 nadir of 88%.  She has been on autoPAP therapy.  I reviewed her AutoPap compliance data for the past 90 days from 06/07/2018 through 09/04/2018, during which time she used her machine 37 days with percent used days greater than 4 hours at 40%, she essentially stopped using her machine about a week ago and was fairly consistent from end of May through beginning of July.  Her AHI was 0.1/h, leak in the high but acceptable range, AutoPap at Default pressure of 4 to 20 cm.  She reports that she stopped using her machine because of a unpleasant odor, at one point, it smelled like cigarette smoke.  She does admit that when she was able to consistently use her AutoPap machine, she felt better, she had better daytime energy.  She reports rare morning headaches and nocturia about once per average night.   I reviewed your telephone visit note from 08/22/2018.  In the past 2 years she had weight gain, in the realm of 20 pounds. Her Epworth sleepiness score is 12 out of 24, fatigue severity score is 20 out of 63.  She  has seen Dr. Wilburn Cornelia in ENT for a swelling of her tonsils, she was treated with Augmentin.  She has had trouble losing weight.  She would be amenable to a referral for medical weight management.  She reports a bedtime of 8 PM, rise time of 530.  She lives with her husband and her 2 sons currently, the older one is 67 years old and in the [place], younger one is 29 and has sleep apnea, uses a CPAP machine.  She is currently not working, she works for [employer].  She is a non-smoker and does not drink alcohol.  She drinks caffeine in the form of soda, 1 to 2/day, occasional tea, no coffee.  Her Past Medical History Is Significant For: Past Medical History:  Diagnosis Date  . Allergic rhinitis   . Anxiety   . Chronic back pain   . Family history of systemic lupus erythematosus   . GERD (gastroesophageal reflux disease)   . Hypothyroidism due to Hashimoto's thyroiditis   . IDA (iron deficiency anemia)   . Morbid obesity ([place])   . OSA (obstructive sleep apnea)   . Vitamin D deficiency     Her Past Surgical History Is Significant For: Past Surgical History:  Procedure Laterality Date  . CESAREAN SECTION    . CHOLECYSTECTOMY      Her Family History Is Significant For: Family History  Problem Relation Age of Onset  . Lupus Mother   . Kidney disease Mother   .  Lupus Cousin     Her Social History Is Significant For: Social History   Socioeconomic History  . Marital status: Married    Spouse name: Not on file  . Number of children: Not on file  . Years of education: Not on file  . Highest education level: Not on file  Occupational History  . Not on file  Social Needs  . Financial resource strain: Not on file  . Food insecurity    Worry: Not on file    Inability: Not on file  . Transportation needs    Medical: Not on file    Non-medical: Not on file  Tobacco Use  . Smoking status: Former Research scientist (life sciences)  . Smokeless tobacco: Never Used  Substance and Sexual Activity  .  Alcohol use: No    Frequency: Never  . Drug use: No  . Sexual activity: Yes    Birth control/protection: None  Lifestyle  . Physical activity    Days per week: Not on file    Minutes per session: Not on file  . Stress: Not on file  Relationships  . Social Herbalist on phone: Not on file    Gets together: Not on file    Attends religious service: Not on file    Active member of club or organization: Not on file    Attends meetings of clubs or organizations: Not on file    Relationship status: Not on file  Other Topics Concern  . Not on file  Social History Narrative  . Not on file    Her Allergies Are:  No Known Allergies:   Her Current Medications Are:  Outpatient Encounter Medications as of 09/04/2018  Medication Sig  . doxycycline (VIBRAMYCIN) 100 MG capsule TAKE 1 CAPSULE BY MOUTH TWICE DAILY WITH FOOD FOR 30 DAYS  . ferrous sulfate 325 (65 FE) MG tablet Take 1 tablet (325 mg total) by mouth 2 (two) times daily.  . folic acid (FOLVITE) 1 MG tablet Take 1 tablet (1 mg total) by mouth daily.  Marland Kitchen levothyroxine (SYNTHROID) 125 MCG tablet Take 1 tablet (125 mcg total) by mouth daily.  Marland Kitchen omeprazole (PRILOSEC) 40 MG capsule Take 1 capsule (40 mg total) by mouth daily as needed.  . Vitamin D, Ergocalciferol, (DRISDOL) 1.25 MG (50000 UT) CAPS capsule Take 1 capsule (50,000 Units total) by mouth every 7 (seven) days.   No facility-administered encounter medications on file as of 09/04/2018.   :  Review of Systems:  Out of a complete 14 point review of systems, all are reviewed and negative with the exception of these symptoms as listed below: Review of Systems  Neurological:       Pt presents today to discuss her cpap. Pt's cpap is about 41 years old but she stopped using it because it developed an unpleasant odor. Pt does not have a local DME.  Epworth Sleepiness Scale 0= would never doze 1= slight chance of dozing 2= moderate chance of dozing 3= high chance of  dozing  Sitting and reading: 2 Watching TV: 2 Sitting inactive in a public place (ex. Theater or meeting): 0 As a passenger in a car for an hour without a break: 2 Lying down to rest in the afternoon: 3 Sitting and talking to someone: 0 Sitting quietly after lunch (no alcohol): 3 In a car, while stopped in traffic: 0 Total: 12     Objective:  Neurological Exam  Physical Exam Physical Examination:   Vitals:  09/04/18 1520  BP: (!) 149/94  Pulse: 99    General Examination: The patient is a very pleasant 43 y.o. female in no acute distress. She appears well-developed and well-nourished and well groomed.   HEENT: Normocephalic, atraumatic, pupils are equal, round and reactive to light and accommodation. Extraocular tracking is good without limitation to gaze excursion or nystagmus noted. Normal smooth pursuit is noted. Hearing is grossly intact. Face is symmetric with normal facial animation and normal facial sensation. Speech is clear with no dysarthria noted. There is no hypophonia. There is no lip, neck/head, jaw or voice tremor. Neck is supple with full range of passive and active motion. There are no carotid bruits on auscultation. Oropharynx exam reveals: mild mouth dryness, adequate dental hygiene and moderate airway crowding, due to Small airway entry, wider tongue, tonsils of about 2+ bilaterally.  Mallampati is class II, neck circumference 16-1/4 inches.  She has a moderate overbite.  Tongue protrudes centrally in palate elevates symmetrically.  Chest: Clear to auscultation without wheezing, rhonchi or crackles noted.  Heart: S1+S2+0, regular and normal without murmurs, rubs or gallops noted.   Abdomen: Soft, non-tender and non-distended with normal bowel sounds appreciated on auscultation.  Extremities: There is no pitting edema in the distal lower extremities bilaterally.  Skin: Warm and dry without trophic changes noted.  Musculoskeletal: exam reveals no obvious  joint deformities, tenderness or joint swelling or erythema.   Neurologically:  Mental status: The patient is awake, alert and oriented in all 4 spheres. Her immediate and remote memory, attention, language skills and fund of knowledge are appropriate. There is no evidence of aphasia, agnosia, apraxia or anomia. Speech is clear with normal prosody and enunciation. Thought process is linear. Mood is normal and affect is normal.  Cranial nerves II - XII are as described above under HEENT exam. In addition: shoulder shrug is normal with equal shoulder height noted. Motor exam: Normal bulk, strength and tone is noted. There is no drift, tremor or rebound. Romberg is negative. Fine motor skills and coordination: grossly intact.  Cerebellar testing: No dysmetria or intention tremor. There is no truncal or gait ataxia.  Sensory exam: intact to light touch in the upper and lower extremities.  Gait, station and balance: She stands easily. No veering to one side is noted. No leaning to one side is noted. Posture is age-appropriate and stance is narrow based. Gait shows normal stride length and normal pace. No problems turning are noted. Tandem walk is unremarkable. Assessment and Plan:    In summary, Winna Navarrette is a very pleasant 43 y.o.-year old female with an underlying medical history of vitamin D deficiency, obesity, hypothyroidism with history of Hashimoto's thyroiditis, reflux disease, chronic back pain, allergic rhinitis and anemia, who  Presents for evaluation of her obstructive sleep apnea.  She was diagnosed with overall mild obstructive sleep apnea a little over 2 years ago.  She has been on AutoPap therapy but recently stopped using her machine because of an abnormal odor.  She also noticed difficulty keeping hose dry after cleaning it. She has had interim weight gain in the realm of 20 pounds.  She would be willing to get reevaluated for sleep apnea and consider CPAP or AutoPap therapy again.  She  is amenable to seeking help for weight management.  She is trying to lose weight but has found it very challenging to lose weight, she has a history of thyroid disease as well.  She is amenable to a referral to medical  weight management which I requested today. I had a long chat with the patient about my findings and the diagnosis of OSA, its prognosis and treatment options. We talked about medical treatments, surgical interventions and non-pharmacological approaches. I explained in particular the risks and ramifications of untreated moderate to severe OSA, especially with respect to developing cardiovascular disease down the Road, including congestive heart failure, difficult to treat hypertension, cardiac arrhythmias, or stroke. Even type 2 diabetes has, in part, been linked to untreated OSA. Symptoms of untreated OSA include daytime sleepiness, memory problems, mood irritability and mood disorder such as depression and anxiety, lack of energy, as well as recurrent headaches, especially morning headaches. We talked about trying to maintain a healthy lifestyle in general, as well as the importance of weight control. I encouraged the patient to eat healthy, exercise daily and keep well hydrated, to keep a scheduled bedtime and wake time routine, to not skip any meals and eat healthy snacks in between meals. I advised the patient not to drive when feeling sleepy. I recommended the following at this time: sleep study.   I explained the sleep test procedure to the patient and also outlined possible surgical and non-surgical treatment options of OSA, including the use of a custom-made dental device (which would require a referral to a specialist dentist or oral surgeon), upper airway surgical options, such as pillar implants, radiofrequency surgery, tongue base surgery, and UPPP (which would involve a referral to an ENT surgeon). Rarely, jaw surgery such as mandibular advancement may be considered.  I also  explained the CPAP treatment option to the patient, who indicated that she would be willing to start PAP therapy again. I explained the importance of being compliant with PAP treatment, not only for insurance purposes but primarily to improve Her symptoms, and for the patient's long term health benefit, including to reduce Her cardiovascular risks. I answered all her questions today and the patient was in agreement. I would like to see her back after the sleep study is completed and encouraged her to call with any interim questions, concerns, problems or updates.   Thank you very much for allowing me to participate in the care of this nice patient. If I can be of any further assistance to you please do not hesitate to call me at [phone].  Sincerely,   Star Age, MD, PhD

## 2018-09-04 NOTE — Patient Instructions (Addendum)
Thank you for choosing [facility] for your sleep related care! It was nice to meet you today! I appreciate that you entrust me with your sleep related healthcare concerns. I hope, I was able to address at least some of your concerns today, and that I can help you feel reassured and also get better.    Here is what we discussed today and what we came up with as our plan for you:  1. I will refer you to a medical weight loss clinic. Please call us, if you don't hear from them in 2 weeks.  2. Based on your symptoms and your exam I believe you are still at risk for obstructive sleep apnea and would benefit from reevaluation. You may be able to get a new CPAP or autoPAP machine. Therefore, I think we should proceed with a sleep study to determine how severe your sleep apnea is. If you have more than mild OSA, I want you to consider ongoing treatment with CPAP. Please remember, the risks and ramifications of moderate to severe obstructive sleep apnea or OSA are: Cardiovascular disease, including congestive heart failure, stroke, difficult to control hypertension, arrhythmias, and even type 2 diabetes has been linked to untreated OSA. Sleep apnea causes disruption of sleep and sleep deprivation in most cases, which, in turn, can cause recurrent headaches, problems with memory, mood, concentration, focus, and vigilance. Most people with untreated sleep apnea report excessive daytime sleepiness, which can affect their ability to drive. Please do not drive if you feel sleepy.   I will likely see you back after your sleep study to go over the test results and where to go from there. We will call you after your sleep study to advise about the results (most likely, you will hear from [place], my nurse) and to set up an appointment at the time, as necessary.    Our sleep lab administrative assistant will call you to schedule your sleep study. If you don't hear back from her by about 2 weeks from now,  please feel free to call her at [phone]. You can leave a message with your phone number and concerns, if you get the voicemail box. She will call back as soon as possible.

## 2018-09-06 ENCOUNTER — Encounter: Payer: Self-pay | Admitting: Family Medicine

## 2018-09-17 ENCOUNTER — Ambulatory Visit (INDEPENDENT_AMBULATORY_CARE_PROVIDER_SITE_OTHER): Payer: BLUE CROSS/BLUE SHIELD | Admitting: Neurology

## 2018-09-17 DIAGNOSIS — G4719 Other hypersomnia: Secondary | ICD-10-CM

## 2018-09-17 DIAGNOSIS — G4733 Obstructive sleep apnea (adult) (pediatric): Secondary | ICD-10-CM

## 2018-09-17 DIAGNOSIS — Z6841 Body Mass Index (BMI) 40.0 and over, adult: Secondary | ICD-10-CM

## 2018-09-17 DIAGNOSIS — G4720 Circadian rhythm sleep disorder, unspecified type: Secondary | ICD-10-CM

## 2018-09-17 DIAGNOSIS — R0683 Snoring: Secondary | ICD-10-CM

## 2018-09-17 DIAGNOSIS — Z820 Family history of epilepsy and other diseases of the nervous system: Secondary | ICD-10-CM

## 2018-09-17 DIAGNOSIS — R635 Abnormal weight gain: Secondary | ICD-10-CM

## 2018-09-17 DIAGNOSIS — R351 Nocturia: Secondary | ICD-10-CM

## 2018-09-19 ENCOUNTER — Telehealth: Payer: Self-pay

## 2018-09-19 ENCOUNTER — Encounter: Payer: Self-pay | Admitting: Neurology

## 2018-09-19 NOTE — Progress Notes (Signed)
Patient referred by Molli Barrows, NP, seen by me on 09/04/18, diagnostic PSG on 09/17/18 for re-eval of her OSA.   Please call and notify the patient that the recent sleep study did not show any significant obstructive sleep apnea. She had snoring which ranged from mild to loud. She has evidence of REM sleep related sleep apnea, but overall AHI is less than 5/h. She has an autoPAP and could use it for now, but PAP therapy is not necessary. I would recommend weight loss, which may reduce her REM related sleep apnea and her snoring. I had referred her to wt management. I am not sure if she has heard from them yet? She can FU prn in sleep clinic.  Thanks,  Star Age, MD, PhD [facility])

## 2018-09-19 NOTE — Procedures (Signed)
PATIENT'S NAME:  Heather Campos, Heather Campos DOB:      Apr 09, 1975      MR#:    [id number]     DATE OF RECORDING: 09/17/2018 REFERRING M.D.:  Molli Barrows, FNP Study Performed:   Baseline Polysomnogram HISTORY: 43 year old woman with a history of vitamin D deficiency, obesity, hypothyroidism with history of Hashimoto's thyroiditis, reflux disease, chronic back pain, allergic rhinitis and anemia, who was previously diagnosed with obstructive sleep apnea. She presents for re-evaluation. She has an autoPAP machine. She is working on weight loss. The patient endorsed the Epworth Sleepiness Scale at 12/24 points. The patient's weight 252 pounds with a height of 64 (inches), resulting in a BMI of 42.9 kg/m2. The patient's neck circumference measured 16.2 inches.  CURRENT MEDICATIONS: Vibramycin, Ferrous sulfate, Folvite, Synthroid, Prilosec, [place].   PROCEDURE:  This is a multichannel digital polysomnogram utilizing the Somnostar 11.2 system.  Electrodes and sensors were applied and monitored per AASM Specifications.   EEG, EOG, Chin and Limb EMG, were sampled at 200 Hz.  ECG, Snore and Nasal Pressure, Thermal Airflow, Respiratory Effort, CPAP Flow and Pressure, Oximetry was sampled at 50 Hz. Digital video and audio were recorded.      BASELINE STUDY  Lights Out was at 21:03 and Lights On at 04:59.  Total recording time (TRT) was 476.5 minutes, with a total sleep time (TST) of 367.5 minutes.   The patient's sleep latency was 74.5 minutes, which is delayed. REM latency was 62 minutes, which is mildly reduced. The sleep efficiency was 77.1 %.     SLEEP ARCHITECTURE: WASO (Wake after sleep onset) was 34 minutes with overall mild sleep fragmentation noted. There were 3 minutes in Stage N1, 262.5 minutes Stage N2, 22.5 minutes Stage N3 and 79.5 minutes in Stage REM.  The percentage of Stage N1 was .8%, Stage N2 was 71.4%, which is increased, Stage N3 was 6.1%, which is reduced and Stage R (REM sleep) was 21.6%, which  is normal. The arousals were noted as: 25 were spontaneous, 11 were associated with PLMs, 4 were associated with respiratory events.  RESPIRATORY ANALYSIS:  There were a total of 15 respiratory events:  0 obstructive apneas, 1 central apneas and 0 mixed apneas with a total of 1 apneas and an apnea index (AI) of .2 /hour. There were 14 hypopneas with a hypopnea index of 2.3 /hour. The patient also had 0 respiratory event related arousals (RERAs).      The total APNEA/HYPOPNEA INDEX (AHI) was 2.4/hour and the total RESPIRATORY DISTURBANCE INDEX was 2.4 /hour.  10 events occurred in REM sleep and 10 events in NREM. The REM AHI was 7.5 /hour, versus a non-REM AHI of 1.. The patient spent 64.5 minutes of total sleep time in the supine position and 303 minutes in non-supine. The supine AHI was 0.0 versus a non-supine AHI of 3.0.  OXYGEN SATURATION & C02:  The Wake baseline 02 saturation was 98%, with the lowest being 75%. Time spent below 89% saturation equaled 8 minutes. PERIODIC LIMB MOVEMENTS: The patient had a total of 26 Periodic Limb Movements.  The Periodic Limb Movement (PLM) index was 4.2 and the PLM Arousal index was 1.8/hour.  Audio and video analysis did not show any abnormal or unusual movements, behaviors, phonations or vocalizations. The patient took 1 bathroom break. Snoring was noted, ranging from mild to loud. The EKG was in keeping with normal sinus rhythm (NSR).  Post-study, the patient indicated that sleep was better than usual.   IMPRESSION:  1.  Primary Snoring 2. Dysfunctions associated with sleep stages or arousal from sleep  RECOMMENDATIONS:  1. This study does not demonstrate any significant obstructive or central sleep disordered breathing with the exception of snoring and mild REM related OSA. Treatment with positive airway pressure is not warranted. Weight loss is recommended.  2. This study shows sleep fragmentation and abnormal sleep stage percentages; these are  nonspecific findings and per se do not signify an intrinsic sleep disorder or a cause for the patient's sleep-related symptoms. Causes include (but are not limited to) the first night effect of the sleep study, circadian rhythm disturbances, medication effect or an underlying mood disorder or medical problem.  3. The patient should be cautioned not to drive, work at heights, or operate dangerous or heavy equipment when tired or sleepy. Review and reiteration of good sleep hygiene measures should be pursued with any patient. 4. The patient will be advised to follow up with the referring provider, who will be notified of the test results.  I certify that I have reviewed the entire raw data recording prior to the issuance of this report in accordance with the Standards of Accreditation of the American Academy of Sleep Medicine (AASM)   Star Age, MD, PhD Diplomat, American Board of Neurology and Sleep Medicine (Neurology and Sleep Medicine)

## 2018-09-19 NOTE — Telephone Encounter (Signed)
I called pt and discussed her results. Pt cannot afford the weight loss clinic at this time. Pt verbalized understanding of results. Pt had no questions at this time but was encouraged to call back if questions arise.

## 2018-09-19 NOTE — Telephone Encounter (Signed)
-----   Message from Star Age, MD sent at 09/19/2018  8:33 AM EDT ----- Patient referred by Molli Barrows, NP, seen by me on 09/04/18, diagnostic PSG on 09/17/18 for re-eval of her OSA.   Please call and notify the patient that the recent sleep study did not show any significant obstructive sleep apnea. She had snoring which ranged from mild to loud. She has evidence of REM sleep related sleep apnea, but overall AHI is less than 5/h. She has an autoPAP and could use it for now, but PAP therapy is not necessary. I would recommend weight loss, which may reduce her REM related sleep apnea and her snoring. I had referred her to wt management. I am not sure if she has heard from them yet? She can FU prn in sleep clinic.  Thanks,  Star Age, MD, PhD [facility])

## 2018-09-25 ENCOUNTER — Ambulatory Visit (INDEPENDENT_AMBULATORY_CARE_PROVIDER_SITE_OTHER): Payer: BLUE CROSS/BLUE SHIELD | Admitting: Family Medicine

## 2018-10-02 ENCOUNTER — Ambulatory Visit (INDEPENDENT_AMBULATORY_CARE_PROVIDER_SITE_OTHER): Payer: BLUE CROSS/BLUE SHIELD | Admitting: Psychology

## 2018-10-09 ENCOUNTER — Ambulatory Visit (INDEPENDENT_AMBULATORY_CARE_PROVIDER_SITE_OTHER): Payer: BLUE CROSS/BLUE SHIELD | Admitting: Family Medicine

## 2018-10-22 ENCOUNTER — Encounter: Payer: Self-pay | Admitting: Family Medicine

## 2018-11-09 ENCOUNTER — Encounter: Payer: Self-pay | Admitting: Family Medicine

## 2018-11-24 ENCOUNTER — Emergency Department (HOSPITAL_COMMUNITY): Payer: Medicaid Other

## 2018-11-24 ENCOUNTER — Other Ambulatory Visit: Payer: Self-pay

## 2018-11-24 ENCOUNTER — Encounter (HOSPITAL_COMMUNITY): Payer: Self-pay | Admitting: *Deleted

## 2018-11-24 ENCOUNTER — Emergency Department (HOSPITAL_COMMUNITY)
Admission: EM | Admit: 2018-11-24 | Discharge: 2018-11-24 | Disposition: A | Discharge status: Home / Self Care | Payer: Medicaid Other | Attending: Emergency Medicine | Admitting: Emergency Medicine

## 2018-11-24 DIAGNOSIS — Y998 Other external cause status: Secondary | ICD-10-CM | POA: Insufficient documentation

## 2018-11-24 DIAGNOSIS — E039 Hypothyroidism, unspecified: Secondary | ICD-10-CM | POA: Insufficient documentation

## 2018-11-24 DIAGNOSIS — W228XXA Striking against or struck by other objects, initial encounter: Secondary | ICD-10-CM | POA: Insufficient documentation

## 2018-11-24 DIAGNOSIS — S99921A Unspecified injury of right foot, initial encounter: Secondary | ICD-10-CM

## 2018-11-24 DIAGNOSIS — Y93E9 Activity, other interior property and clothing maintenance: Secondary | ICD-10-CM | POA: Insufficient documentation

## 2018-11-24 DIAGNOSIS — Y92099 Unspecified place in other non-institutional residence as the place of occurrence of the external cause: Secondary | ICD-10-CM | POA: Insufficient documentation

## 2018-11-24 DIAGNOSIS — Z87891 Personal history of nicotine dependence: Secondary | ICD-10-CM | POA: Insufficient documentation

## 2018-11-24 NOTE — ED Notes (Signed)
Patient left prior to getting d/c paperwork and vital signs.

## 2018-11-24 NOTE — ED Provider Notes (Signed)
[facility] DEPT Provider Note   CSN: [id number] Arrival date & time: 11/24/18  1059     History   Chief Complaint Chief Complaint  Patient presents with  . Toe Injury    Rt    HPI Heather Campos is a 43 y.o. female.     HPI   43 year old female presents with right toe pain.  Patient states she was cleaning around the house when she kicked some step out of the room and hurt her toe.  She is complaining of pain at the right fourth toe.  She states that the pain started radiating up her foot and she became concerned that she broke her toe or foot.  She denies any numbness, tingling, decreased range of motion.  She denies any other injuries.  Past Medical History:  Diagnosis Date  . Allergic rhinitis   . Anxiety   . Chronic back pain   . Family history of systemic lupus erythematosus   . GERD (gastroesophageal reflux disease)   . Hypothyroidism due to Hashimoto's thyroiditis   . IDA (iron deficiency anemia)   . Morbid obesity ([place])   . OSA (obstructive sleep apnea)   . Vitamin D deficiency     Patient Active Problem List   Diagnosis Date Noted  . OSA (obstructive sleep apnea) 02/09/2016  . Family history of systemic lupus erythematosus 02/09/2016  . Vitamin D deficiency 09/02/2015  . Morbid obesity due to excess calories ([place]) 09/02/2015  . Iron deficiency anemia due to chronic blood loss 09/02/2015  . Hypothyroidism due to Hashimoto's thyroiditis 09/02/2015    Past Surgical History:  Procedure Laterality Date  . CESAREAN SECTION    . CHOLECYSTECTOMY       OB History   No obstetric history on file.      Home Medications    Prior to Admission medications   Medication Sig Start Date End Date Taking? Authorizing Provider  doxycycline (VIBRAMYCIN) 100 MG capsule TAKE 1 CAPSULE BY MOUTH TWICE DAILY WITH FOOD FOR 30 DAYS 08/07/18   [provider]  ferrous sulfate 325 (65 FE) MG tablet Take 1 tablet (325 mg total) by  mouth 2 (two) times daily. 08/01/18   Scot Jun, FNP  folic acid (FOLVITE) 1 MG tablet Take 1 tablet (1 mg total) by mouth daily. 08/01/18   Scot Jun, FNP  levothyroxine (SYNTHROID) 125 MCG tablet Take 1 tablet (125 mcg total) by mouth daily. 08/01/18   Scot Jun, FNP  omeprazole (PRILOSEC) 40 MG capsule Take 1 capsule (40 mg total) by mouth daily as needed. 08/01/18   Scot Jun, FNP  Vitamin D, Ergocalciferol, (DRISDOL) 1.25 MG (50000 UT) CAPS capsule Take 1 capsule (50,000 Units total) by mouth every 7 (seven) days. 03/20/18   Scot Jun, FNP    Family History Family History  Problem Relation Age of Onset  . Lupus Mother   . Kidney disease Mother   . Lupus Cousin     Social History Social History   Tobacco Use  . Smoking status: Former Research scientist (life sciences)  . Smokeless tobacco: Never Used  Substance Use Topics  . Alcohol use: No    Frequency: Never  . Drug use: No     Allergies   Patient has no known allergies.   Review of Systems Review of Systems  Constitutional: Negative for chills and fever.  Respiratory: Negative for shortness of breath.   Cardiovascular: Negative for chest pain.  Gastrointestinal: Negative for abdominal pain,  nausea and vomiting.  Musculoskeletal: Positive for arthralgias (right toe pain).     Physical Exam Updated Vital Signs BP (!) 154/97 (BP Location: Right Arm)   Pulse 75   Temp 98.5 F (36.9 C) (Oral)   Resp 16   LMP 10/23/2018   SpO2 100%   Physical Exam Vitals signs and nursing note reviewed.  Constitutional:      Appearance: She is well-developed.  HENT:     Head: Normocephalic and atraumatic.  Eyes:     Conjunctiva/sclera: Conjunctivae normal.  Neck:     Musculoskeletal: Neck supple.  Cardiovascular:     Rate and Rhythm: Normal rate and regular rhythm.     Pulses:          Dorsalis pedis pulses are 3+ on the right side and 3+ on the left side.     Heart sounds: Normal heart sounds. No murmur.   Pulmonary:     Effort: Pulmonary effort is normal. No respiratory distress.     Breath sounds: Normal breath sounds. No wheezing or rales.  Abdominal:     General: Bowel sounds are normal. There is no distension.     Palpations: Abdomen is soft.     Tenderness: There is no abdominal tenderness.  Musculoskeletal: Normal range of motion.        General: No tenderness or deformity.       Feet:  Feet:     Right foot:     Skin integrity: Skin integrity normal.     Toenail Condition: Right toenails are normal.     Left foot:     Skin integrity: Skin integrity normal.     Toenail Condition: Left toenails are normal.  Skin:    General: Skin is warm and dry.     Findings: No erythema or rash.  Neurological:     Mental Status: She is alert and oriented to person, place, and time.  Psychiatric:        Behavior: Behavior normal.      ED Treatments / Results  Labs (all labs ordered are listed, but only abnormal results are displayed) Labs Reviewed - No data to display  EKG None  Radiology Dg Foot Complete Right  Result Date: 11/24/2018 CLINICAL DATA:  Right fourth toe injury this morning EXAM: RIGHT FOOT COMPLETE - 3+ VIEW COMPARISON:  None. FINDINGS: No fracture or dislocation. No suspicious focal osseous lesions. Small Achilles and plantar right calcaneal spurs. No significant arthropathy. No radiopaque foreign body. IMPRESSION: No fracture or malalignment. Electronically Signed   By: Ilona Sorrel M.D.   On: 11/24/2018 12:12    Procedures Procedures (including critical care time)  Medications Ordered in ED Medications - No data to display   Initial Impression / Assessment and Plan / ED Course  I have reviewed the triage vital signs and the nursing notes.  Pertinent labs & imaging results that were available during my care of the patient were reviewed by me and considered in my medical decision making (see chart for details).        Patient presents with right toe  pain.  Her physical exam has some mild tenderness over the right fourth toe but no visible or palpable deformity.  There is no erythema or wounds noted.  She had an x-ray ordered in triage which shows no fracture or dislocation.  Her toes were buddy taped together.  Offered pain medication in the ED and patient declined.  Encouraged rice.  Given return precautions.  Patient  ready and stable for discharge.   At this time there does not appear to be any evidence of an acute emergency medical condition and the patient appears stable for discharge with appropriate outpatient follow up.Diagnosis was discussed with patient who verbalizes understanding and is agreeable to discharge.   Final Clinical Impressions(s) / ED Diagnoses   Final diagnoses:  Injury of toe on right foot, initial encounter    ED Discharge Orders    None       Rachel Moulds 11/24/18 2100    Julianne Rice, MD 11/28/18 2116

## 2018-11-24 NOTE — ED Notes (Signed)
Patient not visualized in room at time of discharge.

## 2018-11-24 NOTE — ED Notes (Signed)
ED Provider at bedside. 

## 2018-11-24 NOTE — ED Triage Notes (Signed)
Pt hit rt 4th toe against door this am, pain and ? dislocation

## 2018-11-24 NOTE — Discharge Instructions (Addendum)
Buddy tape the toes together.  Ice affected area.  Take Tylenol or Motrin as needed for pain.  Follow-up with your primary care provider for continued evaluation as needed.  Return to the ER immediately for new or worsening symptoms or concerns, such as new or worsening pain, difficulty walking or any concerns at all.

## 2018-12-07 ENCOUNTER — Other Ambulatory Visit: Payer: Self-pay | Admitting: Family Medicine

## 2018-12-07 NOTE — Telephone Encounter (Signed)
Forwarding medication refill request to PCP for review. 

## 2018-12-12 ENCOUNTER — Telehealth: Payer: Self-pay | Admitting: Family Medicine

## 2018-12-12 NOTE — Telephone Encounter (Signed)
Refill denied 

## 2018-12-12 NOTE — Telephone Encounter (Signed)
1) Medication(s) Requested (by name): Vitamin D, Ergocalciferol, (DRISDOL) 1.25 MG (50000 UT) CAPS capsule [id number]   2) Pharmacy of Choice: [facility], [place]    Approved medications will be sent to pharmacy, we will reach out to you if there is an issue.  Requests made after 3pm may not be addressed until following business day!

## 2018-12-18 ENCOUNTER — Other Ambulatory Visit: Payer: Self-pay | Admitting: Family Medicine

## 2018-12-18 NOTE — Telephone Encounter (Signed)
Requested medication (s) are due for refill today: yes  Requested medication (s) are on the active medication list: yes  Last refill:  10/10/2018  Future visit scheduled: yes  Notes to clinic:  [place] 08/01/2018 Last filled by Molli Barrows   Requested Prescriptions  Pending Prescriptions Disp Refills   levothyroxine (SYNTHROID) 125 MCG tablet [Pharmacy Med Name: Levothyroxine Sodium 125 MCG Oral Tablet] 90 tablet 0    Sig: Take 1 tablet by mouth once daily     There is no refill protocol information for this order

## 2019-01-31 ENCOUNTER — Ambulatory Visit: Payer: Self-pay

## 2019-02-20 ENCOUNTER — Ambulatory Visit: Payer: Self-pay

## 2019-02-25 ENCOUNTER — Telehealth: Payer: Self-pay

## 2019-02-25 NOTE — Telephone Encounter (Signed)

## 2019-02-26 ENCOUNTER — Ambulatory Visit (INDEPENDENT_AMBULATORY_CARE_PROVIDER_SITE_OTHER): Payer: 59 | Admitting: Internal Medicine

## 2019-02-26 ENCOUNTER — Other Ambulatory Visit: Payer: Self-pay

## 2019-02-26 VITALS — BP 126/85 | HR 82 | Temp 97.7°F | Resp 17 | Wt 248.6 lb

## 2019-02-26 DIAGNOSIS — M25562 Pain in left knee: Secondary | ICD-10-CM

## 2019-02-26 DIAGNOSIS — E559 Vitamin D deficiency, unspecified: Secondary | ICD-10-CM | POA: Diagnosis not present

## 2019-02-26 DIAGNOSIS — Z840 Family history of diseases of the skin and subcutaneous tissue: Secondary | ICD-10-CM

## 2019-02-26 DIAGNOSIS — Z6841 Body Mass Index (BMI) 40.0 and over, adult: Secondary | ICD-10-CM

## 2019-02-26 DIAGNOSIS — E038 Other specified hypothyroidism: Secondary | ICD-10-CM

## 2019-02-26 DIAGNOSIS — D509 Iron deficiency anemia, unspecified: Secondary | ICD-10-CM | POA: Diagnosis not present

## 2019-02-26 DIAGNOSIS — E063 Autoimmune thyroiditis: Secondary | ICD-10-CM

## 2019-02-26 NOTE — Progress Notes (Signed)
Patient ID: Heather Campos, female    DOB: [birth year]  MRN: [id number]  CC: Hypothyroidism and Labs Only (repeat lupus screening labs)   Subjective: Heather Campos is a 44 y.o. female who presents for hypothyroidism monitoring and lupus screening.  Her concerns today include: hypothyroidism monitoring, lupus screening, and left leg pain.   1. Hypothyroidism (Hashimoto's Thyroiditis): Patient currently taking Levothyroxine (Synthroid) (125 mcg by mouth daily) for management of hypothyroidism. Patient states that she consistently takes prescribed Levothyroxine (Synthroid) and does not miss any doses.Patient requests refill of Levothyroxine (Synthroid). Patient denies enlarged thyroid, denies enlarged lymph nodes, denies neck tenderness, denies difficulty swallowing, denies cold intolerance, and denies constipation. Admits fatigue which patient states "I think I feel tired because my vitamin D is low." 2. Family history of lupus erythematosus: Patient's mother has lupus and she is afraid she may get lupus so every month she gets labs which include an ANA, C3/C4, and lupus anticoagulant levels to screen for lupus. These have been negative so far. Patient denies muscle pain, denies muscle cramps, denies joint pain, denies joint swelling of bilateral hands and feet. Patient denies rash on the face, denies rash on remaining areas of the body. Patient denies fever, denies chest pain, denies ulcers in mouth and nose. Patient admits fatigue which she contributes to low vitamin D levels. Patient's mother has lupus and she is afraid that she may get lupus. 3. Iron Deficiency Anemia: Patient currently prescribed ferrous sulfate (325 mg by mouth twice per day) for management of iron deficiency anemia. Patient admits that she does not take iron medication on a consistent basis because it causes constipation for her. Patient states "I do not like to take the iron pill because it causes constipation therefore I  do not take them often." 4. Vitamin D deficiency: Patient currently prescribed vitamin D (Ergocalciferol) (50000 UT by mouth every 7 days). Patient states that she does not take vitamin D the medication on a consistent basis. Patient admits that she does feel tired sometimes of which she contributes to having a low vitamin D level. Patient states "When I feel tired I usually just take my vitamin D pill and feel somewhat better." 5. Obesity: Patient weight down 3 pounds since last visit. Patient wants to lose weight. Patient states that she has decreased consumption of soft drinks. Patient states that she monitors what she eats. Patient denies exercise. 6. Left knee pain and swelling:  Patient states "My left knee has been bothering me over the past month. Patient points to the popliteal area of the left knee. She feels that it very painful at times in this area. "Patient states "Sometimes it feels stiff and sometimes it is painful." Patient contributes some of the pain and stiffness to the way she sits on couches while at home with her legs folded beneath her. Patient states "Once I move around and kind of stretch the stiffness and pain eventually goes away." Patient states "I am concerned because the left knee is swollen and it has never been that way before."  Patient Active Problem List   Diagnosis Date Noted  . OSA (obstructive sleep apnea) 02/09/2016  . Family history of systemic lupus erythematosus 02/09/2016  . Vitamin D deficiency 09/02/2015  . Morbid obesity due to excess calories ([place]) 09/02/2015  . Iron deficiency anemia due to chronic blood loss 09/02/2015  . Hypothyroidism due to Hashimoto's thyroiditis 09/02/2015     Current Outpatient Medications on File Prior to Visit  Medication Sig  Dispense Refill  . ferrous sulfate 325 (65 FE) MG tablet Take 1 tablet (325 mg total) by mouth 2 (two) times daily. [id number] tablet 3  . folic acid (FOLVITE) 1 MG tablet Take 1 tablet (1 mg total) by  mouth daily. 90 tablet 1  . levothyroxine (SYNTHROID) 125 MCG tablet Take 1 tablet by mouth once daily 90 tablet 0  . omeprazole (PRILOSEC) 40 MG capsule Take 1 capsule (40 mg total) by mouth daily as needed. 90 capsule 1  . Vitamin D, Ergocalciferol, (DRISDOL) 1.25 MG (50000 UT) CAPS capsule Take 1 capsule (50,000 Units total) by mouth every 7 (seven) days. (Patient not taking: Reported on 02/26/2019) 12 capsule 1   No current facility-administered medications on file prior to visit.    No Known Allergies  Social History   Socioeconomic History  . Marital status: Married    Spouse name: Not on file  . Number of children: Not on file  . Years of education: Not on file  . Highest education level: Not on file  Occupational History  . Not on file  Tobacco Use  . Smoking status: Former Research scientist (life sciences)  . Smokeless tobacco: Never Used  Substance and Sexual Activity  . Alcohol use: No  . Drug use: No  . Sexual activity: Yes    Birth control/protection: None  Other Topics Concern  . Not on file  Social History Narrative  . Not on file   Social Determinants of Health   Financial Resource Strain:   . Difficulty of Paying Living Expenses: Not on file  Food Insecurity:   . Worried About Charity fundraiser in the Last Year: Not on file  . Ran Out of Food in the Last Year: Not on file  Transportation Needs:   . Lack of Transportation (Medical): Not on file  . Lack of Transportation (Non-Medical): Not on file  Physical Activity:   . Days of Exercise per Week: Not on file  . Minutes of Exercise per Session: Not on file  Stress:   . Feeling of Stress : Not on file  Social Connections:   . Frequency of Communication with Friends and Family: Not on file  . Frequency of Social Gatherings with Friends and Family: Not on file  . Attends Religious Services: Not on file  . Active Member of Clubs or Organizations: Not on file  . Attends Archivist Meetings: Not on file  . Marital  Status: Not on file  Intimate Partner Violence:   . Fear of Current or Ex-Partner: Not on file  . Emotionally Abused: Not on file  . Physically Abused: Not on file  . Sexually Abused: Not on file    Family History  Problem Relation Age of Onset  . Lupus Mother   . Kidney disease Mother   . Lupus Cousin     Past Surgical History:  Procedure Laterality Date  . CESAREAN SECTION    . CHOLECYSTECTOMY      ROS: Review of Systems  Constitutional: Denies fever, denies unintentional weight loss. Reports fatigue related to self-reported vitamin D deficiency.  Cardiovascular: Denies chest pain or discomfort, denies palpitations, denies heart murmurs. Respiratory: Denies shortness of breath, denies cough, denies wheezing. Head, Eyes, Ears, Nose, Neck, & Throat: Denies headache, denies dizziness, denies light-headedness. Denis enlarged thyroid, denies difficulty swallowing, denies neck tenderness, denies neck pain. Admits full range of motion of head and neck. Patient reports that she is currently taking Levothyroxine (Synthroid) daily and does not miss  any doses. Patient states that she has no current concerns related to thyroid function. Patient presents for scheduled thyroid lab collection and refill of Levothyroxine (Synthroid) for hypothyroidism management. Musculoskeletal: Admits posterior left knee stiffness and swelling. Denies additional joint pain; denies stiffness; denies weakness; denies swelling of left lower extremity excluding left posterior knee. Denies joint pain; denies stiffness; denies weakness; denies swelling of right knee and right lower extremity. Gastrointestinal: Denies constipation, denies diarrhea, denies nausea, denies vomiting. Admits occasional reflux of which patient takes prescribed Omeprazole (Prilosec) 40 mg as needed. Patient reports that she does not take Omeprazole often as she monitors her diet to not consume many acidic and spicy foods. Integumentary: Denies  rash, denies sores, denies color change, denies changes in hair and nails.  PHYSICAL EXAM: BP 126/85   Pulse 82   Temp 97.7 F (36.5 C) (Temporal)   Resp 17   Wt 248 lb 9.6 oz (112.8 kg)   SpO2 97%   BMI 42.67 kg/m   General: Middle-aged obese African American female in no acute distress. Constitutional: Alert and oriented to person, time, place, and situation.Patient is a reliable historian.  Cardiovascular: Regular rate and rhythm. Negative for heart murmurs. Negative for abnormal heart sounds. Respiratory: Bilateral upper and lower lung fields clear upon auscultation. Negative for adventitious breath sounds. Head, Eyes, Ears, Nose, Neck, & Throat: Facial expression symmetrical in relation to smile and blinking. Negative for facial drooping. Negative for goiter, negative for enlarged lymph nodes, and negative for enlarged thyroid. Negative for lymphadenopathy. Negative superficial and deep anterior; posterior cervical; and supraclavicular lymph nodes.Trachea midline.Conjunctiva pale. Sclera white. Musculoskeletal: The left knee- no edema noted in the popliteal area compared to the right knee and no masses palpated in the left popliteal area. Full range of motion of bilateral lower extremities.  CMP Latest Ref Rng & Units 03/14/2018 06/16/2017  Glucose 65 - 99 mg/dL 94 107(H)  BUN 6 - 24 mg/dL 8 13  Creatinine 0.57 - 1.00 mg/dL 0.73 0.80  Sodium 134 - 144 mmol/L 137 138  Potassium 3.5 - 5.2 mmol/L 3.8 3.7  Chloride 96 - 106 mmol/L 104 106  CO2 20 - 29 mmol/L 18(L) 23  Calcium 8.7 - 10.2 mg/dL 9.0 9.1  Total Protein 6.0 - 8.5 g/dL 7.1 -  Total Bilirubin 0.0 - 1.2 mg/dL 0.5 -  Alkaline Phos 39 - 117 IU/L 92 -  AST 0 - 40 IU/L 21 -  ALT 0 - 32 IU/L 23 -   Lipid Panel     Component Value Date/Time   CHOL 170 08/01/2018 1007   TRIG 89 08/01/2018 1007   HDL 62 08/01/2018 1007   CHOLHDL 2.7 08/01/2018 1007   LDLCALC 90 08/01/2018 1007    CBC    Component Value Date/Time   WBC  8.3 03/14/2018 0922   WBC 9.3 06/16/2017 1743   RBC 4.40 03/14/2018 0922   RBC 4.72 06/16/2017 1743   HGB 10.9 (L) 03/14/2018 0922   HCT 33.2 (L) 03/14/2018 0922   PLT 328 03/14/2018 0922   MCV 76 (L) 03/14/2018 0922   MCH 24.8 (L) 03/14/2018 0922   MCH 26.7 06/16/2017 1743   MCHC 32.8 03/14/2018 0922   MCHC 34.2 06/16/2017 1743   RDW 17.9 (H) 03/14/2018 0922   LYMPHSABS 3.2 (H) 03/14/2018 0922   EOSABS 0.3 03/14/2018 0922   BASOSABS 0.0 03/14/2018 0922    ASSESSMENT AND PLAN: 1. Hypothyroidism due to Hashimoto's thyroiditis: TSH lab will be collected during today's visit  for evaluation of current thyroid function. Patient will continue current prescribed Levothyroxine (Synthroid) 125 mcg by mouth daily.   2. Family history of lupus erythematosus: Patient educated that screening for lupus is not indicate unless the patient has symptoms to suggest lupus or previously diagnosed with lupus in relation to the context of maternal family history. Patient insisting on having some type of lab done so we decided to do just the ANA. Patient educated that the labs in which she has historically been screened for over the course of the years (C3, C4, lupus anticoagulant, and ANA) are not indicated unless the patient has previously been or is currently diagnosed with lupus. Patient is in agreement with this plan. Patient asked to notify the provider if any systemic changes related to signs and symptoms of lupus presents and the plan will then be reevaluated.  3. Iron deficiency anemia, unspecified iron deficiency anemia type: Patient advised to resume iron pills if labs still show anemia. Patient educated that supplements such as Miralax will assist with decreasing the side effect of constipation related to ferrous sulfate consumption. Explained to patient that orange juice or beverages with vitamin C will help increase absorption of ferrous sulfate.  4. Vitamin D deficiency: Vitamin D lab will be  drawn today and update given if needs to resume medication.  5. Class 3 severe obesity without serious comorbidity with body mass index (BMI) of 40.0 to 44.9 in adult, unspecified obesity type [facility]): Patient educated on the importance of moderate to high-intensity exercise at a minimum of 150 minutes per week. Patient educated on the importance of a heart healthy diet which includes but not limited to vegetables, fruit, lean meat, water, and portion control. Patient's weight in July 2020 was 252 lbs. Patient's current weight is 248 lbs.  6. Arthralgia of left lower leg: Reassurance given to patient based on physical exam. Patient will continue to monitor if further concerns present.   Patient was given the opportunity to ask questions.  Patient verbalized understanding of the plan and was able to repeat key elements of the plan.   No orders of the defined types were placed in this encounter.    Requested Prescriptions   Pending Prescriptions Disp Refills  . ferrous sulfate 325 (65 FE) MG tablet 180 tablet 3    Sig: Take 1 tablet (325 mg total) by mouth 2 (two) times daily.  . folic acid (FOLVITE) 1 MG tablet 90 tablet 1    Sig: Take 1 tablet (1 mg total) by mouth daily.  Marland Kitchen omeprazole (PRILOSEC) 40 MG capsule 90 capsule 1    Sig: Take 1 capsule (40 mg total) by mouth daily as needed.  . Vitamin D, Ergocalciferol, (DRISDOL) 1.25 MG (50000 UT) CAPS capsule 12 capsule 1    Sig: Take 1 capsule (50,000 Units total) by mouth every 7 (seven) days.    No follow-ups on file.  Camillia Herter, NP

## 2019-02-26 NOTE — Patient Instructions (Addendum)
Patient encouraged to begin taking iron pills daily with orange juice or vitamin C to increase absorption of medication. Take Miralax to assist with the potential side effect of constipation related to iron pill consumption. Monitor left leg for increased swelling.  Obesity, Adult Obesity is having too much body fat. Being obese means that your weight is more than what is healthy for you. BMI is a number that explains how much body fat you have. If you have a BMI of 30 or more, you are obese. Obesity is often caused by eating or drinking more calories than your body uses. Changing your lifestyle can help you lose weight. Obesity can cause serious health problems, such as:  Stroke.  Coronary artery disease (CAD).  Type 2 diabetes.  Some types of cancer, including cancers of the colon, breast, uterus, and gallbladder.  Osteoarthritis.  High blood pressure (hypertension).  High cholesterol.  Sleep apnea.  Gallbladder stones.  Infertility problems. What are the causes?  Eating meals each day that are high in calories, sugar, and fat.  Being born with genes that may make you more likely to become obese.  Having a medical condition that causes obesity.  Taking certain medicines.  Sitting a lot (having a sedentary lifestyle).  Not getting enough sleep.  Drinking a lot of drinks that have sugar in them. What increases the risk?  Having a family history of obesity.  Being an [place] American woman.  Being a Hispanic man.  Living in an area with limited access to: ? Romilda Garret, recreation centers, or sidewalks. ? Healthy food choices, such as grocery stores and farmers' markets. What are the signs or symptoms? The main sign is having too much body fat. How is this treated?  Treatment for this condition often includes changing your lifestyle. Treatment may include: ? Changing your diet. This may include making a healthy meal plan. ? Exercise. This may include activity that  causes your heart to beat faster (aerobic exercise) and strength training. Work with your doctor to design a program that works for you. ? Medicine to help you lose weight. This may be used if you are not able to lose 1 pound a week after 6 weeks of healthy eating and more exercise. ? Treating conditions that cause the obesity. ? Surgery. Options may include gastric banding and gastric bypass. This may be done if:  Other treatments have not helped to improve your condition.  You have a BMI of 40 or higher.  You have life-threatening health problems related to obesity. Follow these instructions at home: Eating and drinking   Follow advice from your doctor about what to eat and drink. Your doctor may tell you to: ? Limit fast food, sweets, and processed snack foods. ? Choose low-fat options. For example, choose low-fat milk instead of whole milk. ? Eat 5 or more servings of fruits or vegetables each day. ? Eat at home more often. This gives you more control over what you eat. ? Choose healthy foods when you eat out. ? Learn to read food labels. This will help you learn how much food is in 1 serving. ? Keep low-fat snacks available. ? Avoid drinks that have a lot of sugar in them. These include soda, fruit juice, iced tea with sugar, and flavored milk.  Drink enough water to keep your pee (urine) pale yellow.  Do not go on fad diets. Physical activity  Exercise often, as told by your doctor. Most adults should get up to 150 minutes  of moderate-intensity exercise every week.Ask your doctor: ? What types of exercise are safe for you. ? How often you should exercise.  Warm up and stretch before being active.  Do slow stretching after being active (cool down).  Rest between times of being active. Lifestyle  Work with your doctor and a food expert (dietitian) to set a weight-loss goal that is best for you.  Limit your screen time.  Find ways to reward yourself that do not involve  food.  Do not drink alcohol if: ? Your doctor tells you not to drink. ? You are pregnant, may be pregnant, or are planning to become pregnant.  If you drink alcohol: ? Limit how much you use to:  0-1 drink a day for women.  0-2 drinks a day for men. ? Be aware of how much alcohol is in your drink. In the [place] one drink equals one 12 oz bottle of beer (355 mL), one 5 oz glass of wine (148 mL), or one 1 oz glass of hard liquor (44 mL). General instructions  Keep a weight-loss journal. This can help you keep track of: ? The food that you eat. ? How much exercise you get.  Take over-the-counter and prescription medicines only as told by your doctor.  Take vitamins and supplements only as told by your doctor.  Think about joining a support group.  Keep all follow-up visits as told by your doctor. This is important. Contact a doctor if:  You cannot meet your weight loss goal after you have changed your diet and lifestyle for 6 weeks. Get help right away if you:  Are having trouble breathing.  Are having thoughts of harming yourself. Summary  Obesity is having too much body fat.  Being obese means that your weight is more than what is healthy for you.  Work with your doctor to set a weight-loss goal.  Get regular exercise as told by your doctor. This information is not intended to replace advice given to you by your health care provider. Make sure you discuss any questions you have with your health care provider. Document Revised: 10/12/2017 Document Reviewed: 10/12/2017 Elsevier Patient Education  2020 [employer].

## 2019-02-27 LAB — CBC
Hematocrit: 31.6 % — ABNORMAL LOW (ref 34.0–46.6)
Hemoglobin: 10.1 g/dL — ABNORMAL LOW (ref 11.1–15.9)
MCH: 23.5 pg — ABNORMAL LOW (ref 26.6–33.0)
MCHC: 32 g/dL (ref 31.5–35.7)
MCV: 74 fL — ABNORMAL LOW (ref 79–97)
Platelets: 375 10*3/uL (ref 150–450)
RBC: 4.3 x10E6/uL (ref 3.77–5.28)
RDW: 16.7 % — ABNORMAL HIGH (ref 11.7–15.4)
WBC: 8.1 10*3/uL (ref 3.4–10.8)

## 2019-02-27 LAB — TSH: TSH: 1.59 u[IU]/mL (ref 0.450–4.500)

## 2019-02-27 LAB — ANA W/REFLEX IF POSITIVE: Anti Nuclear Antibody (ANA): NEGATIVE

## 2019-02-27 LAB — VITAMIN D 25 HYDROXY (VIT D DEFICIENCY, FRACTURES): Vit D, 25-Hydroxy: 27.7 ng/mL — ABNORMAL LOW (ref 30.0–100.0)

## 2019-03-01 ENCOUNTER — Encounter: Payer: Self-pay | Admitting: Family Medicine

## 2019-03-01 ENCOUNTER — Other Ambulatory Visit: Payer: Self-pay | Admitting: Family Medicine

## 2019-03-04 MED ORDER — FERROUS SULFATE 325 (65 FE) MG PO TABS: 325.0000 mg | ORAL_TABLET | Freq: Two times a day (BID) | ORAL | 0 refills | Status: DC

## 2019-03-04 MED ORDER — LEVOTHYROXINE SODIUM 125 MCG PO TABS: 125.0000 ug | ORAL_TABLET | Freq: Every day | ORAL | 0 refills | Status: DC

## 2019-03-04 MED ORDER — VITAMIN D (ERGOCALCIFEROL) 1.25 MG (50000 UNIT) PO CAPS: 50000.0000 [IU] | ORAL_CAPSULE | ORAL | 0 refills | Status: DC

## 2019-03-04 MED ORDER — FOLIC ACID 1 MG PO TABS: 1.0000 mg | ORAL_TABLET | Freq: Every day | ORAL | 0 refills | Status: DC

## 2019-03-08 ENCOUNTER — Encounter: Payer: Self-pay | Admitting: Family Medicine

## 2019-03-13 ENCOUNTER — Telehealth: Payer: Self-pay | Admitting: Family Medicine

## 2019-03-13 NOTE — Telephone Encounter (Signed)
Patient is unhappy that Dr. Wynetta Emery has taken her off a medication that she has been on for years. She reached out to Dr. Wynetta Emery on the 15th. Jeanette Caprice routed the message to Dr. Wynetta Emery. Patient called yesterday and I informed her that Dr. Wynetta Emery was here and she should be giving her a reply or call soon regarding this medication. Patient is very unhappy that she has not received any message or phone call regarding this medication.  Patient called around 9am this morning asking to speak to a supervisor. Please contact patient as she is very upset.

## 2019-03-14 ENCOUNTER — Encounter: Payer: Self-pay | Admitting: Internal Medicine

## 2019-03-14 ENCOUNTER — Other Ambulatory Visit: Payer: Self-pay | Admitting: Internal Medicine

## 2019-03-14 ENCOUNTER — Telehealth: Payer: Self-pay | Admitting: Internal Medicine

## 2019-03-14 NOTE — Telephone Encounter (Signed)
PC placed to pt this evening in reply to Mychart message sent today.  Pt was seen by NP fellow Minette Brine under my guidance on 02/26/2019.   Pt reports she had called the office at [facility] several times since last wk regarding her vitamin D rxn.  Said she spoke with the receptionist who told her that she would hear back from me.   He looking over her chart, I see she had sent a Mychart message to Lavell Anchors on 03/01/2019 requesting RFs on all meds.  All Rfs sent 03/04/19 including the Ergocalciferol (high dose Vit D).  She then sent a Mychart message on 03/08/19 address to NP Lavell Anchors who is no longer with [place] requesting that she be put back on high dose vit D that she was on for yrs. CMA  routed message to me under Lavell Anchors name so message was not pick up.  Only phone message seen in chart is one written 03/13/19 by front desk person Ms. Pike. I informed pt tonight that rxns were sent 1/111/2021 and that she did not hear from me in her previous 2 Mychart messages as they were not addressed to me.  The CMA could have informed me via a telephone message and I have spoke with her about that.  Will also speak with Ms. Ladean Raya about the several messages she left with her via telephone. In regards to the vitamin D, I told her that I RF the dose that was requested from her pharmacy and what was on her med list as Vit D Ergocalciferol 1.25 mg (50,000 UT) one tab every 7 days.  Pt thought that the 1.25 mg meant that I had lowered the dose.  I told her this is what was on her list and I will check with our clinical pharmacist tomorrow to see if the 1.25 mg is equivalent to the 50,000 units.  I will let her know via Mychart message.  I apologize to her for any confusion.  Pt also apologized.

## 2019-03-15 NOTE — Telephone Encounter (Signed)
Talked to patient today and apologized for customer service and patient stated that her needs had been taken care of by Dr. Wynetta Emery.  Patient is satisfied with outcome.

## 2019-03-21 ENCOUNTER — Emergency Department (HOSPITAL_COMMUNITY): Payer: 59

## 2019-03-21 ENCOUNTER — Emergency Department (HOSPITAL_COMMUNITY)
Admission: EM | Admit: 2019-03-21 | Discharge: 2019-03-21 | Disposition: A | Discharge status: Home / Self Care | Payer: 59 | Attending: Emergency Medicine | Admitting: Emergency Medicine

## 2019-03-21 ENCOUNTER — Other Ambulatory Visit: Payer: Self-pay

## 2019-03-21 ENCOUNTER — Encounter (HOSPITAL_COMMUNITY): Payer: Self-pay

## 2019-03-21 DIAGNOSIS — U071 COVID-19: Secondary | ICD-10-CM | POA: Diagnosis not present

## 2019-03-21 DIAGNOSIS — Z20822 Contact with and (suspected) exposure to covid-19: Secondary | ICD-10-CM

## 2019-03-21 DIAGNOSIS — E039 Hypothyroidism, unspecified: Secondary | ICD-10-CM | POA: Insufficient documentation

## 2019-03-21 DIAGNOSIS — M546 Pain in thoracic spine: Secondary | ICD-10-CM | POA: Insufficient documentation

## 2019-03-21 DIAGNOSIS — R197 Diarrhea, unspecified: Secondary | ICD-10-CM

## 2019-03-21 DIAGNOSIS — Z87891 Personal history of nicotine dependence: Secondary | ICD-10-CM | POA: Diagnosis not present

## 2019-03-21 LAB — COMPREHENSIVE METABOLIC PANEL
ALT: 27 U/L (ref 0–44)
AST: 27 U/L (ref 15–41)
Albumin: 4.1 g/dL (ref 3.5–5.0)
Alkaline Phosphatase: 85 U/L (ref 38–126)
Anion gap: 11 (ref 5–15)
BUN: 10 mg/dL (ref 6–20)
CO2: 23 mmol/L (ref 22–32)
Calcium: 8.7 mg/dL — ABNORMAL LOW (ref 8.9–10.3)
Chloride: 101 mmol/L (ref 98–111)
Creatinine, Ser: 0.92 mg/dL (ref 0.44–1.00)
GFR calc Af Amer: >60 mL/min (ref >60)
GFR calc non Af Amer: >60 mL/min (ref >60)
Glucose, Bld: 118 mg/dL — ABNORMAL HIGH (ref 70–99)
Potassium: 3.7 mmol/L (ref 3.5–5.1)
Sodium: 135 mmol/L (ref 135–145)
Total Bilirubin: 1 mg/dL (ref 0.3–1.2)
Total Protein: 8.6 g/dL — ABNORMAL HIGH (ref 6.5–8.1)

## 2019-03-21 LAB — CBC WITH DIFFERENTIAL/PLATELET
Abs Immature Granulocytes: 0.01 10*3/uL (ref 0.00–0.07)
Basophils Absolute: 0 10*3/uL (ref 0.0–0.1)
Basophils Relative: 0 %
Eosinophils Absolute: 0 10*3/uL (ref 0.0–0.5)
Eosinophils Relative: 0 %
HCT: 40.8 % (ref 36.0–46.0)
Hemoglobin: 13.1 g/dL (ref 12.0–15.0)
Immature Granulocytes: 0 %
Lymphocytes Relative: 31 %
Lymphs Abs: 1.5 10*3/uL (ref 0.7–4.0)
MCH: 24.5 pg — ABNORMAL LOW (ref 26.0–34.0)
MCHC: 32.1 g/dL (ref 30.0–36.0)
MCV: 76.3 fL — ABNORMAL LOW (ref 80.0–100.0)
Monocytes Absolute: 0.7 10*3/uL (ref 0.1–1.0)
Monocytes Relative: 14 %
Neutro Abs: 2.6 10*3/uL (ref 1.7–7.7)
Neutrophils Relative %: 55 %
Platelets: 256 10*3/uL (ref 150–400)
RBC: 5.35 MIL/uL — ABNORMAL HIGH (ref 3.87–5.11)
RDW: 20.7 % — ABNORMAL HIGH (ref 11.5–15.5)
WBC: 4.9 10*3/uL (ref 4.0–10.5)
nRBC: 0 % (ref 0.0–0.2)

## 2019-03-21 LAB — I-STAT BETA HCG BLOOD, ED (MC, WL, AP ONLY): I-stat hCG, quantitative: <5 m[IU]/mL (ref <5)

## 2019-03-21 LAB — LIPASE, BLOOD: Lipase: 23 U/L (ref 11–51)

## 2019-03-21 LAB — SARS CORONAVIRUS 2 (TAT 6-24 HRS): SARS Coronavirus 2: POSITIVE — AB

## 2019-03-21 MED ORDER — SODIUM CHLORIDE 0.9 % IV BOLUS
1000.0000 mL | Freq: Once | INTRAVENOUS | Status: AC
  Administered 2019-03-21: 1000 mL via INTRAVENOUS

## 2019-03-21 MED ORDER — ACETAMINOPHEN 500 MG PO TABS
1000.0000 mg | ORAL_TABLET | Freq: Once | ORAL | Status: AC
  Administered 2019-03-21: 1000 mg via ORAL
  Filled 2019-03-21: qty 2

## 2019-03-21 NOTE — ED Notes (Signed)
Patient given hot packs for back pain at this time.

## 2019-03-21 NOTE — ED Provider Notes (Signed)
[facility] DEPT Provider Note   CSN: [id number] Arrival date & time: 03/21/19  [id number]     History Chief Complaint  Patient presents with  . Diarrhea  . Back Pain    Heather Campos is a 44 y.o. female.  The history is provided by the patient. No language interpreter was used.  Diarrhea Back Pain    44 year old female with history of chronic back pain, anxiety, GERD, hypothyroidism and prior surgical history including cholecystectomy and cesarean section presenting for evaluation of diarrhea.  Patient report for the past 4 to 5 days she has had subjective fever, chills, aches and pains to her upper back, as well as having 2-3 episodes of loose stools per day.  She endorsed decrease in appetite, has lost approximately 6 pounds of weight due to not eating much.  States that her husband has similar symptoms for the past 2 to 3 days.  She does not complain of any significant congestion, sneezing, coughing, shortness of breath or chest pain.  No loss of taste or smell, no dysuria no hematuria or rash.  She voiced concern for potential COVID-19 infection.  She does admits to taking doxycycline on a chronic basis for her "boils on in my armpit" but does not think her diarrhea is related to antibiotic use as she has been on this medication for years.  She denies any blood or mucus in her diarrhea.  She does not complain of any significant nausea or vomiting.  Report pain is localized to her upper back, across her back and is sharp and achy.  Pain is moderate in severity.  Patient denies any recent sick contact with anyone with COVID-19.  Past Medical History:  Diagnosis Date  . Allergic rhinitis   . Anxiety   . Chronic back pain   . Family history of systemic lupus erythematosus   . GERD (gastroesophageal reflux disease)   . Hypothyroidism due to Hashimoto's thyroiditis   . IDA (iron deficiency anemia)   . Morbid obesity ([place])   . OSA (obstructive sleep apnea)     . Vitamin D deficiency     Patient Active Problem List   Diagnosis Date Noted  . OSA (obstructive sleep apnea) 02/09/2016  . Family history of systemic lupus erythematosus 02/09/2016  . Vitamin D deficiency 09/02/2015  . Morbid obesity due to excess calories ([place]) 09/02/2015  . Iron deficiency anemia due to chronic blood loss 09/02/2015  . Hypothyroidism due to Hashimoto's thyroiditis 09/02/2015    Past Surgical History:  Procedure Laterality Date  . CESAREAN SECTION    . CHOLECYSTECTOMY       OB History   No obstetric history on file.     Family History  Problem Relation Age of Onset  . Lupus Mother   . Kidney disease Mother   . Lupus Cousin     Social History   Tobacco Use  . Smoking status: Former Research scientist (life sciences)  . Smokeless tobacco: Never Used  Substance Use Topics  . Alcohol use: No  . Drug use: No    Home Medications Prior to Admission medications   Medication Sig Start Date End Date Taking? Authorizing Provider  ferrous sulfate 325 (65 FE) MG tablet Take 1 tablet (325 mg total) by mouth 2 (two) times daily. 03/04/19   Ladell Pier, MD  folic acid (FOLVITE) 1 MG tablet Take 1 tablet (1 mg total) by mouth daily. 03/04/19   Ladell Pier, MD  levothyroxine (SYNTHROID) 125 MCG tablet Take  1 tablet (125 mcg total) by mouth daily. 03/04/19   Ladell Pier, MD  omeprazole (PRILOSEC) 40 MG capsule Take 1 capsule (40 mg total) by mouth daily as needed. 08/01/18   Scot Jun, FNP  Vitamin D, Ergocalciferol, (DRISDOL) 1.25 MG (50000 UT) CAPS capsule Take 1 capsule (50,000 Units total) by mouth every 7 (seven) days. 03/04/19   Ladell Pier, MD    Allergies    Patient has no known allergies.  Review of Systems   Review of Systems  Gastrointestinal: Positive for diarrhea.  Musculoskeletal: Positive for back pain.  All other systems reviewed and are negative.   Physical Exam Updated Vital Signs BP (!) 160/116 (BP Location: Left Arm)   Pulse  (!) 121   Temp 99.6 F (37.6 C) (Oral)   Resp 18   Ht 5\' 4"  (1.626 m)   Wt 108.7 kg   LMP 03/20/2019 (Approximate)   SpO2 97%   BMI 41.13 kg/m   Physical Exam Vitals and nursing note reviewed.  Constitutional:      General: She is not in acute distress.    Appearance: She is well-developed. She is obese.     Comments: Patient is tearful.  HENT:     Head: Atraumatic.  Eyes:     Conjunctiva/sclera: Conjunctivae normal.  Cardiovascular:     Rate and Rhythm: Tachycardia present.     Pulses: Normal pulses.     Heart sounds: Normal heart sounds.  Pulmonary:     Effort: Pulmonary effort is normal.     Breath sounds: Normal breath sounds. No wheezing, rhonchi or rales.  Abdominal:     Palpations: Abdomen is soft.     Tenderness: There is no abdominal tenderness.  Musculoskeletal:        General: No swelling.     Cervical back: Neck supple.  Skin:    Findings: No rash.  Neurological:     Mental Status: She is alert and oriented to person, place, and time.  Psychiatric:        Mood and Affect: Mood normal.     ED Results / Procedures / Treatments   Labs (all labs ordered are listed, but only abnormal results are displayed) Labs Reviewed  CBC WITH DIFFERENTIAL/PLATELET - Abnormal; Notable for the following components:      Result Value   RBC 5.35 (*)    MCV 76.3 (*)    MCH 24.5 (*)    RDW 20.7 (*)    All other components within normal limits  COMPREHENSIVE METABOLIC PANEL - Abnormal; Notable for the following components:   Glucose, Bld 118 (*)    Calcium 8.7 (*)    Total Protein 8.6 (*)    All other components within normal limits  SARS CORONAVIRUS 2 (TAT 6-24 HRS)  LIPASE, BLOOD  I-STAT BETA HCG BLOOD, ED ([facility], [facility], [facility] ONLY)    EKG None  Radiology DG Chest Portable 1 View  Result Date: 03/21/2019 CLINICAL DATA:  Diarrhea, back pain, decreased appetite EXAM: PORTABLE CHEST 1 VIEW COMPARISON:  Portable exam at 0841 hours compared to 06/16/2017 FINDINGS: Normal  heart size, mediastinal contours, and pulmonary vascularity. Mild RIGHT basilar atelectasis. Lungs otherwise clear. No infiltrate, pleural effusion or pneumothorax. Bones unremarkable. IMPRESSION: Mild RIGHT basilar atelectasis. Electronically Signed   By: Lavonia Dana M.D.   On: 03/21/2019 09:02    Procedures Procedures (including critical care time)  Medications Ordered in ED Medications  sodium chloride 0.9 % bolus 1,000 mL (1,000 mLs  Intravenous New Bag/Given 03/21/19 0917)  acetaminophen (TYLENOL) tablet 1,000 mg (1,000 mg Oral Given 03/21/19 [id number])    ED Course  I have reviewed the triage vital signs and the nursing notes.  Pertinent labs & imaging results that were available during my care of the patient were reviewed by me and considered in my medical decision making (see chart for details).    MDM Rules/Calculators/A&P                      BP (!) 158/113   Pulse (!) 102   Temp 99.6 F (37.6 C) (Oral)   Resp 18   Ht 5\' 4"  (1.626 m)   Wt 108.7 kg   LMP 03/20/2019 (Approximate)   SpO2 98%   BMI 41.13 kg/m   Final Clinical Impression(s) / ED Diagnoses Final diagnoses:  Diarrhea, unspecified type  Suspected COVID-19 virus infection    Rx / DC Orders ED Discharge Orders    None     8:33 AM Patient here with subjective fever chills, upper back pain as well as having diarrhea.  She was concerned for potential COVID-19 infection this has been is having similar symptoms.  She does not have any URI symptoms.  She does takes doxycycline on a regular basis this may increase the risk of C. difficile however she does not have any abdominal pain and bowel movement is less than 4 episodes per day.  Patient found to be tachycardic with a heart rate of 121 as well as an elevated temperature of 99.6.  Will obtain chest x-ray, obtain COVID-19 testing, basic labs, will give IV fluid and pain medication.  She is not hypoxic.  9:57 AM Tachycardia improved with IV fluid.  Labs are  reassuring, x-ray of the chest showing some mild right basilar atelectasis but no focal infiltrates and no pleural effusion.  COVID-19 test is currently pending.  However, after IV fluid patient is stable to be discharged.  Patient is currently a person under investigation for suspected COVID-19 infection.  I also did mention potential C. difficile infection given her persistent use of doxycycline.  Patient is then to return if her diarrhea persists getting worse.  Patient given instruction per CDC COVID-19 guidelines.  Heather Campos was evaluated in Emergency Department on 03/21/2019 for the symptoms described in the history of present illness. She was evaluated in the context of the global COVID-19 pandemic, which necessitated consideration that the patient might be at risk for infection with the SARS-CoV-2 virus that causes COVID-19. Institutional protocols and algorithms that pertain to the evaluation of patients at risk for COVID-19 are in a state of rapid change based on information released by regulatory bodies including the CDC and federal and state organizations. These policies and algorithms were followed during the patient's care in the ED.    Domenic Moras, PA-C 03/21/19 1042    Isla Pence, MD 03/21/19 1124

## 2019-03-21 NOTE — ED Triage Notes (Signed)
Pt presents with c/o diarrhea and back pain. Pt reports they symptoms started on Sunday, denies any injury to her back, reports she has been using a heating pack for relief. Pt reports she has had an decreased appetite.

## 2019-03-21 NOTE — Discharge Instructions (Signed)
Your symptoms may be due to covid-19 infection.  Please check MyChart for labs result which should result in 24 hrs.  Continue to self quarantine.  Take ibuprofen or tylenol as needed for fever and aches.  Stay hydrated and drink plenty of fluid.  Return to the ER if you have persistent worsening diarrhea, having worsening shortness of breath or if you have any other concerns.

## 2019-03-22 ENCOUNTER — Other Ambulatory Visit: Payer: Self-pay | Admitting: Adult Health

## 2019-03-22 ENCOUNTER — Encounter: Payer: Self-pay | Admitting: Internal Medicine

## 2019-03-22 DIAGNOSIS — U071 COVID-19: Secondary | ICD-10-CM

## 2019-03-22 NOTE — Progress Notes (Signed)
  I connected by phone with Heather Campos on 03/22/2019 at 6:52 PM to discuss the potential use of an new treatment for mild to moderate COVID-19 viral infection in non-hospitalized patients.  This patient is a 44 y.o. female that meets the FDA criteria for Emergency Use Authorization of bamlanivimab or casirivimab\imdevimab.  Has a (+) direct SARS-CoV-2 viral test result  Has mild or moderate COVID-19   Is ? 44 years of age and weighs ? 40 kg  Is NOT hospitalized due to COVID-19  Is NOT requiring oxygen therapy or requiring an increase in baseline oxygen flow rate due to COVID-19  Is within 10 days of symptom onset  Has at least one of the high risk factor(s) for progression to severe COVID-19 and/or hospitalization as defined in EUA.  Specific high risk criteria : BMI >/= 35; symptom onset 03/17/2019   I have spoken and communicated the following to the patient or parent/caregiver:  1. FDA has authorized the emergency use of bamlanivimab and casirivimab\imdevimab for the treatment of mild to moderate COVID-19 in adults and pediatric patients with positive results of direct SARS-CoV-2 viral testing who are 1 years of age and older weighing at least 40 kg, and who are at high risk for progressing to severe COVID-19 and/or hospitalization.  2. The significant known and potential risks and benefits of bamlanivimab and casirivimab\imdevimab, and the extent to which such potential risks and benefits are unknown.  3. Information on available alternative treatments and the risks and benefits of those alternatives, including clinical trials.  4. Patients treated with bamlanivimab and casirivimab\imdevimab should continue to self-isolate and use infection control measures (e.g., wear mask, isolate, social distance, avoid sharing personal items, clean and disinfect "high touch" surfaces, and frequent handwashing) according to CDC guidelines.   5. The patient or parent/caregiver has the option  to accept or refuse bamlanivimab or casirivimab\imdevimab .  After reviewing this information with the patient, The patient agreed to proceed with receiving the bamlanimivab infusion and will be provided a copy of the Fact sheet prior to receiving the infusion.Scot Dock 03/22/2019 6:52 PM

## 2019-03-25 ENCOUNTER — Ambulatory Visit (HOSPITAL_COMMUNITY): Payer: 59

## 2019-04-04 ENCOUNTER — Ambulatory Visit (INDEPENDENT_AMBULATORY_CARE_PROVIDER_SITE_OTHER): Payer: 59 | Admitting: Internal Medicine

## 2019-04-04 DIAGNOSIS — U071 COVID-19: Secondary | ICD-10-CM | POA: Diagnosis not present

## 2019-04-04 DIAGNOSIS — H9202 Otalgia, left ear: Secondary | ICD-10-CM

## 2019-04-04 MED ORDER — CIPROFLOXACIN-DEXAMETHASONE 0.3-0.1 % OT SUSP: 4.0000 [drp] | Freq: Two times a day (BID) | OTIC | 0 refills | Status: AC

## 2019-04-04 MED ORDER — FLUTICASONE PROPIONATE 50 MCG/ACT NA SUSP: 2.0000 | Freq: Every day | NASAL | 6 refills | Status: DC

## 2019-04-04 NOTE — Progress Notes (Signed)
Virtual Visit via Telephone Note  I connected with Heather Campos, on 04/04/2019 at 9:33 AM by telephone due to the COVID-19 pandemic and verified that I am speaking with the correct person using two identifiers.   Consent: I discussed the limitations, risks, security and privacy concerns of performing an evaluation and management service by telephone and the availability of in person appointments. I also discussed with the patient that there may be a patient responsible charge related to this service. The patient expressed understanding and agreed to proceed.   Location of Patient: Home   Location of Provider: Clinic    Persons participating in Telemedicine visit: Lakenzie Coua Caudill [facility] Dr. Juleen China      History of Present Illness: Patient has follow up for [place]. Was diagnosed on 1/28. Reports that all symptoms have resolved. No SOB, chest pain, fatigue, cough.   Left ear feels full like its clogged up. It has been itching on the inside. Hearing an echo through her ear. Hurts when she sleeps on that side. Produces pain when she pulls on the ear.    Past Medical History:  Diagnosis Date  . Allergic rhinitis   . Anxiety   . Chronic back pain   . Family history of systemic lupus erythematosus   . GERD (gastroesophageal reflux disease)   . Hypothyroidism due to Hashimoto's thyroiditis   . IDA (iron deficiency anemia)   . Morbid obesity ([place])   . OSA (obstructive sleep apnea)   . Vitamin D deficiency    No Known Allergies  Current Outpatient Medications on File Prior to Visit  Medication Sig Dispense Refill  . doxycycline (VIBRAMYCIN) 100 MG capsule Take 100 mg by mouth 2 (two) times daily.    . ferrous sulfate 325 (65 FE) MG tablet Take 1 tablet (325 mg total) by mouth 2 (two) times daily. 99991111 tablet 0  . folic acid (FOLVITE) 1 MG tablet Take 1 tablet (1 mg total) by mouth daily. 90 tablet 0  . levothyroxine (SYNTHROID) 125 MCG tablet Take 1 tablet (125 mcg  total) by mouth daily. 90 tablet 0  . omeprazole (PRILOSEC) 40 MG capsule Take 1 capsule (40 mg total) by mouth daily as needed. 90 capsule 1  . Vitamin D, Ergocalciferol, (DRISDOL) 1.25 MG (50000 UT) CAPS capsule Take 1 capsule (50,000 Units total) by mouth every 7 (seven) days. 12 capsule 0   No current facility-administered medications on file prior to visit.    Observations/Objective: NAD. Speaking clearly.  Work of breathing normal.  Alert and oriented. Mood appropriate.   Assessment and Plan: 1. COVID-19 Patient has recovered. Discussed period of quarantining and adherence to 3W's. No retesting within 90 days. If becomes symptomatic after 90 days, would recommend re-testing.   2. Left ear pain Could be eustachian tube dysfunction and some fluid build up behind TM given report of feeling underwater and pressure within the ear. Recent COVID-19 infection may have lead to increased sinus congestion and dysfunction. Given report of pain to palpation of external ear and itchiness within the canal, am also concerned for acute otitis externa. Will treat presumptively for both with nasal corticosteroid and otic antbiotic/steroid drop.  - fluticasone (FLONASE) 50 MCG/ACT nasal spray; Place 2 sprays into both nostrils daily.  Dispense: 16 g; Refill: 6 - ciprofloxacin-dexamethasone (CIPRODEX) OTIC suspension; Place 4 drops into the left ear 2 (two) times daily for 7 days.  Dispense: 2.8 mL; Refill: 0   Follow Up Instructions: Follow up for annual exam  I discussed the assessment and treatment plan with the patient. The patient was provided an opportunity to ask questions and all were answered. The patient agreed with the plan and demonstrated an understanding of the instructions.   The patient was advised to call back or seek an in-person evaluation if the symptoms worsen or if the condition fails to improve as anticipated.     I provided 16 minutes total of non-face-to-face time during  this encounter including median intraservice time, reviewing previous notes, investigations, ordering medications, medical decision making, coordinating care and patient verbalized understanding at the end of the visit.    Phill Myron, D.O. Primary Care at [facility]  04/04/2019, 9:33 AM

## 2019-04-08 ENCOUNTER — Other Ambulatory Visit: Payer: Self-pay | Admitting: Internal Medicine

## 2019-04-08 DIAGNOSIS — H9202 Otalgia, left ear: Secondary | ICD-10-CM

## 2019-04-20 ENCOUNTER — Other Ambulatory Visit: Payer: Self-pay | Admitting: Internal Medicine

## 2019-04-23 MED ORDER — VITAMIN D (ERGOCALCIFEROL) 1.25 MG (50000 UNIT) PO CAPS: 50000.0000 [IU] | ORAL_CAPSULE | ORAL | 0 refills | Status: DC

## 2019-05-01 ENCOUNTER — Other Ambulatory Visit: Payer: 59

## 2019-05-01 DIAGNOSIS — R399 Unspecified symptoms and signs involving the genitourinary system: Secondary | ICD-10-CM

## 2019-05-02 ENCOUNTER — Ambulatory Visit (INDEPENDENT_AMBULATORY_CARE_PROVIDER_SITE_OTHER): Payer: 59 | Admitting: Internal Medicine

## 2019-05-02 ENCOUNTER — Encounter: Payer: Self-pay | Admitting: Internal Medicine

## 2019-05-02 DIAGNOSIS — N3001 Acute cystitis with hematuria: Secondary | ICD-10-CM | POA: Diagnosis not present

## 2019-05-02 LAB — MICROSCOPIC EXAMINATION
Casts: NONE SEEN /lpf
Epithelial Cells (non renal): NONE SEEN /hpf (ref 0–10)
RBC, Urine: NONE SEEN /hpf (ref 0–2)
WBC, UA: >30 /hpf — AB (ref 0–5)

## 2019-05-02 LAB — URINALYSIS, COMPLETE
Bilirubin, UA: NEGATIVE
Glucose, UA: NEGATIVE
Ketones, UA: NEGATIVE
Nitrite, UA: POSITIVE — AB
Specific Gravity, UA: 1.018 (ref 1.005–1.030)
Urobilinogen, Ur: 0.2 mg/dL (ref 0.2–1.0)
pH, UA: 5 (ref 5.0–7.5)

## 2019-05-02 LAB — URINE CULTURE: Organism ID, Bacteria: NO GROWTH

## 2019-05-02 MED ORDER — CEPHALEXIN 500 MG PO CAPS: 500.0000 mg | ORAL_CAPSULE | Freq: Two times a day (BID) | ORAL | 0 refills | Status: DC

## 2019-05-02 NOTE — Progress Notes (Signed)
Virtual Visit via Telephone Note  I connected with Heather Campos, on 05/02/2019 at 8:35 AM by telephone due to the COVID-19 pandemic and verified that I am speaking with the correct person using two identifiers.   Consent: I discussed the limitations, risks, security and privacy concerns of performing an evaluation and management service by telephone and the availability of in person appointments. I also discussed with the patient that there may be a patient responsible charge related to this service. The patient expressed understanding and agreed to proceed.   Location of Patient: Home   Location of Provider: Clinic    Persons participating in Telemedicine visit: Bonnetta Shavonda Risi [facility] Dr. Juleen China      History of Present Illness: Patient has a visit for concerns of UTI. Reports she had one a couple weeks ago and went to urgent care. She had a negative UA but reports they called her and told her that her culture was positive. She thinks they said something about a resistant UTI. She was treated with an antibiotic, Nitrofurantion,  x7 days. She reports that her symptoms did get better with the antibiotic. However, symptoms have since returned. She is having left flank pain, suprapubic pain. Endorses frequency and urgency. Denies dysuria. Was on menstrual cycle when provided sample. No fevers or chills. No vomiting or nausea.    Past Medical History:  Diagnosis Date  . Allergic rhinitis   . Anxiety   . Chronic back pain   . Family history of systemic lupus erythematosus   . GERD (gastroesophageal reflux disease)   . Hypothyroidism due to Hashimoto's thyroiditis   . IDA (iron deficiency anemia)   . Morbid obesity ([place])   . OSA (obstructive sleep apnea)   . Vitamin D deficiency    No Known Allergies  Current Outpatient Medications on File Prior to Visit  Medication Sig Dispense Refill  . doxycycline (VIBRAMYCIN) 100 MG capsule Take 100 mg by mouth 2 (two) times  daily.    . ferrous sulfate 325 (65 FE) MG tablet Take 1 tablet (325 mg total) by mouth 2 (two) times daily. 180 tablet 0  . fluticasone (FLONASE) 50 MCG/ACT nasal spray Place 2 sprays into both nostrils daily. 16 g 6  . folic acid (FOLVITE) 1 MG tablet Take 1 tablet (1 mg total) by mouth daily. 90 tablet 0  . levothyroxine (SYNTHROID) 125 MCG tablet Take 1 tablet (125 mcg total) by mouth daily. 90 tablet 0  . omeprazole (PRILOSEC) 40 MG capsule Take 1 capsule (40 mg total) by mouth daily as needed. 90 capsule 1  . Vitamin D, Ergocalciferol, (DRISDOL) 1.25 MG (50000 UNIT) CAPS capsule Take 1 capsule (50,000 Units total) by mouth every 7 (seven) days. 4 capsule 0   No current facility-administered medications on file prior to visit.    Observations/Objective: NAD. Speaking clearly.  Work of breathing normal.  Alert and oriented. Mood appropriate.   Assessment and Plan: 1. Acute cystitis with hematuria UA with leukocytes and positive nitrites. Red color and 1+ RBCs likely related to menstrual cycle. Unable to access urine culture results from urgent care visit patient referenced. Patient stable in terms of PO intake and can be treated outpatient. ?complicated UTI with report of flank pain but otherwise no concerning symptoms. Unable to evaluate if pain is actually in area of the kidneys. Will treat for acute cystitis with Keflex. Urine culture results pending and will f/u sensitivities. If not sensitive to Keflex or if patient reports flank pain does not  improve, will plan to broaden antibiotics for better kidney penetration. Will plan to repeat UA after treatment.  - cephALEXin (KEFLEX) 500 MG capsule; Take 1 capsule (500 mg total) by mouth 2 (two) times daily.  Dispense: 14 capsule; Refill: 0   Follow Up Instructions: PRN    I discussed the assessment and treatment plan with the patient. The patient was provided an opportunity to ask questions and all were answered. The patient agreed with  the plan and demonstrated an understanding of the instructions.   The patient was advised to call back or seek an in-person evaluation if the symptoms worsen or if the condition fails to improve as anticipated.     I provided 14 minutes total of non-face-to-face time during this encounter including median intraservice time, reviewing previous notes, investigations, ordering medications, medical decision making, coordinating care and patient verbalized understanding at the end of the visit.    Phill Myron, D.O. Primary Care at [facility]  05/02/2019, 8:35 AM

## 2019-05-13 ENCOUNTER — Other Ambulatory Visit: Payer: Self-pay | Admitting: Obstetrics & Gynecology

## 2019-05-13 ENCOUNTER — Other Ambulatory Visit: Payer: Self-pay

## 2019-05-13 ENCOUNTER — Ambulatory Visit (INDEPENDENT_AMBULATORY_CARE_PROVIDER_SITE_OTHER): Payer: 59 | Admitting: Nurse Practitioner

## 2019-05-13 VITALS — BP 134/86 | HR 110 | Temp 97.1°F | Ht 64.0 in | Wt 253.0 lb

## 2019-05-13 DIAGNOSIS — Z8269 Family history of other diseases of the musculoskeletal system and connective tissue: Secondary | ICD-10-CM | POA: Diagnosis not present

## 2019-05-13 DIAGNOSIS — R000 Tachycardia, unspecified: Secondary | ICD-10-CM

## 2019-05-13 DIAGNOSIS — Z8616 Personal history of COVID-19: Secondary | ICD-10-CM | POA: Insufficient documentation

## 2019-05-13 DIAGNOSIS — M791 Myalgia, unspecified site: Secondary | ICD-10-CM | POA: Insufficient documentation

## 2019-05-13 DIAGNOSIS — L659 Nonscarring hair loss, unspecified: Secondary | ICD-10-CM | POA: Insufficient documentation

## 2019-05-13 NOTE — Progress Notes (Signed)
'@Patient'  ID: Heather Campos, female    DOB: 1975/11/12, 44 y.o.   MRN: [id number]  Chief Complaint  Patient presents with  . Post COVID    Tested positive 1/28. Body aches went to [facility] yesterday was given prednisone and Flexeril. Has also seen some hair loss. Has been having echoing in ears has an appt with ENT on 3/30.    Referring provider: Caryl Campos*   44 year old female with history of allergic rhinitis, anxiety, chronic back pain, GERD, hypothyroidism due to Hashimoto's thyroiditis, iron deficiency anemia, morbid obesity, OSA, vitamin D deficiency and recent diagnosis of Covid in January 2021.    Recent significant Encounters:   03/21/19 ED visit: Seen for fever, chills, diarrhea.  Treated with IV fluids.  Diagnosed with Covid.  Discharged home with supportive measures.  05/02/19 OV visit PCP: Seen for follow-up on UTI.  Patient had already been treated with nitrofurantoin x7 days prescribed by UC.  UA in office was concerning for continuing UTI.  Patient was treated with Keflex.  Imaging:   03/21/19 Chest Xray: Mild RIGHT basilar atelectasis.   HPI  Patient presents for post covid care visit. She was diagnosed with covid in January.  Since that time she has had ongoing symptoms including ears ringing (has upcoming appointment scheduled with ENT), headaches, memory loss, hair loss, and frequent recurrent UTI's. She is currently being treated for UTI by PCP. She has been prescribed Keflex. She states that she is starting to feel improvement in urinary symptoms. She went to the [facility] care yesterday for muscle pain. She was given a steroid injection and started on prednisone. She states that this has helped relieve her pain. Patient has family history of Lupus. She is trying to stay active. Denies f/c/s, n/v/d, hemoptysis, PND, leg swelling, or chest pain.      No Known Allergies  Immunization History  Administered Date(s) Administered  .  Influenza,inj,Quad PF,6+ Mos 01/17/2018, 12/23/2018  . Tdap 03/14/2018    Past Medical History:  Diagnosis Date  . Allergic rhinitis   . Anxiety   . Chronic back pain   . Family history of systemic lupus erythematosus   . GERD (gastroesophageal reflux disease)   . Hypothyroidism due to Hashimoto's thyroiditis   . IDA (iron deficiency anemia)   . Morbid obesity ([place])   . OSA (obstructive sleep apnea)   . Vitamin D deficiency     Tobacco History: Social History   Tobacco Use  Smoking Status Former Smoker  Smokeless Tobacco Campos Used   Counseling given: Not Answered   Outpatient Encounter Medications as of 05/13/2019  Medication Sig  . doxycycline (VIBRAMYCIN) 100 MG capsule Take 100 mg by mouth 2 (two) times daily.  . ferrous sulfate 325 (65 FE) MG tablet Take 1 tablet (325 mg total) by mouth 2 (two) times daily.  . folic acid (FOLVITE) 1 MG tablet Take 1 tablet (1 mg total) by mouth daily.  Marland Kitchen levothyroxine (SYNTHROID) 125 MCG tablet Take 1 tablet (125 mcg total) by mouth daily.  Marland Kitchen omeprazole (PRILOSEC) 40 MG capsule Take 1 capsule (40 mg total) by mouth daily as needed.  . Vitamin D, Ergocalciferol, (DRISDOL) 1.25 MG (50000 UNIT) CAPS capsule Take 1 capsule (50,000 Units total) by mouth every 7 (seven) days.  . cephALEXin (KEFLEX) 500 MG capsule Take 1 capsule (500 mg total) by mouth 2 (two) times daily.  . fluticasone (FLONASE) 50 MCG/ACT nasal spray Place 2 sprays into both nostrils daily. (  Patient not taking: Reported on 05/13/2019)   No facility-administered encounter medications on file as of 05/13/2019.     Review of Systems  Review of Systems  Constitutional: Negative.  Negative for activity change, appetite change and chills.  HENT: Negative.   Respiratory: Positive for cough and shortness of breath.   Cardiovascular: Negative.  Negative for chest pain, palpitations and leg swelling.  Gastrointestinal: Negative.   Genitourinary: Positive for dysuria and  frequency.  Musculoskeletal: Positive for arthralgias and myalgias.  Allergic/Immunologic: Negative.   Neurological: Negative.   Psychiatric/Behavioral: Positive for decreased concentration.       Memory loss       Physical Exam  BP 134/86 (BP Location: Right Arm, Patient Position: Sitting, Cuff Size: Large)   Pulse (!) 110   Temp (!) 97.1 F (36.2 C)   Ht '5\' 4"'  (1.626 m)   Wt 253 lb (114.8 kg)   LMP 05/01/2019   SpO2 98%   BMI 43.43 kg/m   Wt Readings from Last 5 Encounters:  05/13/19 253 lb (114.8 kg)  03/21/19 239 lb 9.6 oz (108.7 kg)  02/26/19 248 lb 9.6 oz (112.8 kg)  09/04/18 252 lb (114.3 kg)  08/01/18 249 lb (112.9 kg)     Physical Exam Vitals and nursing note reviewed.  Constitutional:      General: She is not in acute distress.    Appearance: She is well-developed.  Cardiovascular:     Rate and Rhythm: Regular rhythm. Tachycardia present.  Pulmonary:     Effort: Pulmonary effort is normal. No respiratory distress.     Breath sounds: Normal breath sounds. No wheezing or rhonchi.  Neurological:     Mental Status: She is alert and oriented to person, place, and time.  Psychiatric:        Mood and Affect: Mood normal.     Comments: Slow to answer questions at times during exam.       Assessment & Plan:   History of COVID-19 Assessment:  Recurrent UTI - currently treated by PCP  Family history of Lupus - will refer to rheumatology considering myriad of symptoms and recent Covid   Muscle pain - under control after visit to UC yesterday  Tachycardia - Most likely due to steroid injection yesterday and starting prednisone today - will recheck in 1 week   Plan:  Will check labs and call with results  Nutritional rehabilitation hand out given  Will refer to rheumatology - may need work-up for lupus  Stay well hydrated - please keep follow-up with PCP for recurrent UTI's  Stay active  Continue prednisone as prescribed by UC yesterday for  muscle pain - this could be causing tachy cardia especially after getting steroid injection yesterday.   Follow up in post covid care clinic in 1 week for recheck on heart rate       Heather Foy, NP 05/14/2019

## 2019-05-13 NOTE — Patient Instructions (Addendum)
Will check labs and call with results  Nutritional rehabilitation hand out given  Will refer to rheumatology - may need work-up for lupus  Stay well hydrated - please keep follow-up with PCP for recurrent UTI's  Stay active  Continue prednisone as prescribed by UC yesterday for muscle pain - this could be causing tachy cardia especially after getting steroid injection yesterday.   Follow up in post covid care clinic in 1 week for recheck on heart rate

## 2019-05-14 DIAGNOSIS — R000 Tachycardia, unspecified: Secondary | ICD-10-CM | POA: Insufficient documentation

## 2019-05-14 LAB — BASIC METABOLIC PANEL
BUN/Creatinine Ratio: 9 (ref 9–23)
BUN: 9 mg/dL (ref 6–24)
CO2: 21 mmol/L (ref 20–29)
Calcium: 9.5 mg/dL (ref 8.7–10.2)
Chloride: 105 mmol/L (ref 96–106)
Creatinine, Ser: 0.97 mg/dL (ref 0.57–1.00)
GFR calc Af Amer: 83 mL/min/{1.73_m2} (ref >59)
GFR calc non Af Amer: 72 mL/min/{1.73_m2} (ref >59)
Glucose: 126 mg/dL — ABNORMAL HIGH (ref 65–99)
Potassium: 4 mmol/L (ref 3.5–5.2)
Sodium: 140 mmol/L (ref 134–144)

## 2019-05-14 LAB — CBC
Hematocrit: 34.7 % (ref 34.0–46.6)
Hemoglobin: 11.4 g/dL (ref 11.1–15.9)
MCH: 26 pg — ABNORMAL LOW (ref 26.6–33.0)
MCHC: 32.9 g/dL (ref 31.5–35.7)
MCV: 79 fL (ref 79–97)
Platelets: 330 10*3/uL (ref 150–450)
RBC: 4.39 x10E6/uL (ref 3.77–5.28)
RDW: 19.2 % — ABNORMAL HIGH (ref 11.7–15.4)
WBC: 17.5 10*3/uL — ABNORMAL HIGH (ref 3.4–10.8)

## 2019-05-14 LAB — ANA W/REFLEX IF POSITIVE: Anti Nuclear Antibody (ANA): NEGATIVE

## 2019-05-14 NOTE — Assessment & Plan Note (Signed)
Assessment:  Recurrent UTI - currently treated by PCP  Family history of Lupus - will refer to rheumatology considering myriad of symptoms and recent Covid   Muscle pain - under control after visit to UC yesterday  Tachycardia - Most likely due to steroid injection yesterday and starting prednisone today - will recheck in 1 week   Plan:  Will check labs and call with results  Nutritional rehabilitation hand out given  Will refer to rheumatology - may need work-up for lupus  Stay well hydrated - please keep follow-up with PCP for recurrent UTI's  Stay active  Continue prednisone as prescribed by UC yesterday for muscle pain - this could be causing tachy cardia especially after getting steroid injection yesterday.   Follow up in post covid care clinic in 1 week for recheck on heart rate

## 2019-05-14 NOTE — Progress Notes (Signed)
Patient notified of results, verbally understood. No additional questions.

## 2019-05-20 ENCOUNTER — Ambulatory Visit (INDEPENDENT_AMBULATORY_CARE_PROVIDER_SITE_OTHER): Payer: 59 | Admitting: Nurse Practitioner

## 2019-05-20 ENCOUNTER — Other Ambulatory Visit: Payer: Self-pay

## 2019-05-20 VITALS — BP 118/72 | HR 80 | Temp 96.9°F | Ht 64.0 in | Wt 255.5 lb

## 2019-05-20 DIAGNOSIS — Z8269 Family history of other diseases of the musculoskeletal system and connective tissue: Secondary | ICD-10-CM | POA: Diagnosis not present

## 2019-05-20 DIAGNOSIS — R5383 Other fatigue: Secondary | ICD-10-CM

## 2019-05-20 DIAGNOSIS — Z8616 Personal history of COVID-19: Secondary | ICD-10-CM

## 2019-05-20 DIAGNOSIS — R000 Tachycardia, unspecified: Secondary | ICD-10-CM

## 2019-05-20 DIAGNOSIS — M791 Myalgia, unspecified site: Secondary | ICD-10-CM

## 2019-05-20 DIAGNOSIS — N390 Urinary tract infection, site not specified: Secondary | ICD-10-CM | POA: Diagnosis not present

## 2019-05-20 NOTE — Progress Notes (Signed)
_0  ID: Heather Campos, female    DOB: December 15, 1975, 44 y.o.   MRN: [id number]  Chief Complaint  Patient presents with  . Follow-up    1 week follow up on heart rate. Per patient she's been checking at home and has been normal.    Referring provider: Caryl Never*  44 year old female with history of allergic rhinitis, anxiety, chronic back pain, GERD, hypothyroidism due to Hashimoto's thyroiditis, iron deficiency anemia, morbid obesity, OSA, vitamin D deficiency and recent diagnosis of Covid in January 2021.   Recent significant Encounters:   03/21/19 ED visit: Seen for fever, chills, diarrhea.  Treated with IV fluids.  Diagnosed with Covid.  Discharged home with supportive measures.  05/02/19 OV visit PCP: Seen for follow-up on UTI.  Patient had already been treated with nitrofurantoin x7 days prescribed by UC.  UA in office was concerning for continuing UTI.  Patient was treated with Keflex.  05/12/19 UC: Treated for shoulder pain with steroid injection and prednisone.   05/13/19 [place] clinic: Patient having ongoing symptoms since Covid diagnosis including ears ringing (has upcoming appointment scheduled with ENT), headaches, memory loss, hair loss, and frequent recurrent UTI's. Tachycardia - Most likely due to steroid injection yesterday and starting prednisone today - will recheck in 1 week. Referral placed to rheumatology for symptoms and family history of lupus.   Imaging:   03/21/19 Chest Xray: Mild RIGHT basilar atelectasis.   HPI  Patient presents today for a follow up in post covid care clinic. At last visit patient was found to have tachycardia. She had been given a steroid injection and prednisone the day before at urgent care for shoulder pain. She states that since last visit she has been monitoring her heart rate with smart phone and it is much improved after finishing prednisone. She has been doing well. She states that her shoulder pain has  returned. The pain is in the left shoulder. She states that she has decreased range of motion to left shoulder. She is still awaiting appointment with rheumatology.  Denies f/c/s, n/v/d, hemoptysis, chest pain or edema.     No Known Allergies  Immunization History  Administered Date(s) Administered  . Influenza,inj,Quad PF,6+ Mos 01/17/2018, 12/23/2018  . Tdap 03/14/2018    Past Medical History:  Diagnosis Date  . Allergic rhinitis   . Anxiety   . Chronic back pain   . Family history of systemic lupus erythematosus   . GERD (gastroesophageal reflux disease)   . Hypothyroidism due to Hashimoto's thyroiditis   . IDA (iron deficiency anemia)   . Morbid obesity ([place])   . OSA (obstructive sleep apnea)   . Vitamin D deficiency     Tobacco History: Social History   Tobacco Use  Smoking Status Former Smoker  Smokeless Tobacco Never Used   Counseling given: Yes   Outpatient Encounter Medications as of 05/20/2019  Medication Sig  . doxycycline (VIBRAMYCIN) 100 MG capsule Take 100 mg by mouth 2 (two) times daily.  . ferrous sulfate 325 (65 FE) MG tablet Take 1 tablet (325 mg total) by mouth 2 (two) times daily.  . fluticasone (FLONASE) 50 MCG/ACT nasal spray Place 2 sprays into both nostrils daily.  . folic acid (FOLVITE) 1 MG tablet Take 1 tablet (1 mg total) by mouth daily.  Marland Kitchen levothyroxine (SYNTHROID) 125 MCG tablet Take 1 tablet (125 mcg total) by mouth daily.  Marland Kitchen omeprazole (PRILOSEC) 40 MG capsule Take 1 capsule (40 mg total) by mouth daily  as needed.  . Vitamin D, Ergocalciferol, (DRISDOL) 1.25 MG (50000 UNIT) CAPS capsule Take 1 capsule (50,000 Units total) by mouth every 7 (seven) days.  . cephALEXin (KEFLEX) 500 MG capsule Take 1 capsule (500 mg total) by mouth 2 (two) times daily.   No facility-administered encounter medications on file as of 05/20/2019.     Review of Systems  Review of Systems  Constitutional: Negative.   HENT: Negative.   Respiratory: Negative  for cough, shortness of breath and wheezing.   Cardiovascular: Negative.   Gastrointestinal: Negative.   Musculoskeletal: Positive for arthralgias.       Left shoulder pain  Allergic/Immunologic: Negative.   Neurological: Negative.   Psychiatric/Behavioral: Negative.        Physical Exam  BP 118/72 (BP Location: Right Arm, Patient Position: Sitting, Cuff Size: Large)   Pulse 80   Temp (!) 96.9 F (36.1 C)   Ht '5\' 4"'$  (1.626 m)   Wt 255 lb 8 oz (115.9 kg)   LMP 05/01/2019   SpO2 98%   BMI 43.86 kg/m   Wt Readings from Last 5 Encounters:  05/20/19 255 lb 8 oz (115.9 kg)  05/13/19 253 lb (114.8 kg)  03/21/19 239 lb 9.6 oz (108.7 kg)  02/26/19 248 lb 9.6 oz (112.8 kg)  09/04/18 252 lb (114.3 kg)     Physical Exam Vitals and nursing note reviewed.  Constitutional:      General: She is not in acute distress.    Appearance: She is well-developed.  Cardiovascular:     Rate and Rhythm: Normal rate and regular rhythm.  Pulmonary:     Effort: Pulmonary effort is normal.     Breath sounds: Normal breath sounds.  Musculoskeletal:     Left shoulder: No swelling or deformity. Decreased range of motion.     Right lower leg: No edema.     Left lower leg: No edema.  Neurological:     Mental Status: She is alert and oriented to person, place, and time.      Lab Results:  CBC    Component Value Date/Time   WBC 17.5 (H) 05/13/2019 1533   WBC 4.9 03/21/2019 0915   RBC 4.39 05/13/2019 1533   RBC 5.35 (H) 03/21/2019 0915   HGB 11.4 05/13/2019 1533   HCT 34.7 05/13/2019 1533   PLT 330 05/13/2019 1533   MCV 79 05/13/2019 1533   MCH 26.0 (L) 05/13/2019 1533   MCH 24.5 (L) 03/21/2019 0915   MCHC 32.9 05/13/2019 1533   MCHC 32.1 03/21/2019 0915   RDW 19.2 (H) 05/13/2019 1533   LYMPHSABS 1.5 03/21/2019 0915   LYMPHSABS 3.2 (H) 03/14/2018 0922   MONOABS 0.7 03/21/2019 0915   EOSABS 0.0 03/21/2019 0915   EOSABS 0.3 03/14/2018 0922   BASOSABS 0.0 03/21/2019 0915   BASOSABS  0.0 03/14/2018 0922    BMET    Component Value Date/Time   NA 140 05/13/2019 1533   K 4.0 05/13/2019 1533   CL 105 05/13/2019 1533   CO2 21 05/13/2019 1533   GLUCOSE 126 (H) 05/13/2019 1533   GLUCOSE 118 (H) 03/21/2019 0915   BUN 9 05/13/2019 1533   CREATININE 0.97 05/13/2019 1533   CALCIUM 9.5 05/13/2019 1533   GFRNONAA 72 05/13/2019 1533   GFRAA 83 05/13/2019 1533     Assessment & Plan:   History of COVID-19 Recurrent UTI - currently treated by PCP - WBC elevated at last check - please follow up on UTI and lab work in  1 week with PCP  Family history of Lupus - Refered to rheumatology at last visit considering myriad of symptoms and recent Covid   Tachycardia - Most likely due to steroid injection and prednisone today - Heart rate WNL today - walked in office - heart rate remained stable.    Plan:  Follow up labs and UA at PCP  Awaiting referal to rheumatology - may need work-up for lupus  Stay well hydrated  Stay active   Follow up in post covid care clinic as needed     Fenton Foy, NP 05/20/2019

## 2019-05-20 NOTE — Patient Instructions (Addendum)
Please follow up with PCP in 1 week for repeat UA and CBC  Stay active   Stay well hydrated  Left Shoulder Strain:  Activity  Rest your shoulder.  Move your arm only as much as told by your health care provider, but move your hand and fingers often to prevent stiffness and swelling.  Return to your normal activities as told by your health care provider. Ask your health care provider what activities are safe for you.  Ask your health care provider when it is safe for you to drive if you have a sling or brace on your shoulder.  If you were shown how to do any exercises, do them as told by your health care provider. General instructions  If directed, put ice on the affected area. ? Put ice in a plastic bag. ? Place a towel between your skin and the bag. ? Leave the ice on for 20 minutes, 2-3 times a day.  Take over-the-counter and prescription medicines only as told by your health care provider.  Do not use any products that contain nicotine or tobacco, such as cigarettes, e-cigarettes, and chewing tobacco. These can delay healing. If you need help quitting, ask your health care provider.  Keep all follow-up visits as told by your health care provider. This is important. Contact a health care provider if:  Your pain gets worse.  Your pain is not relieved with medicines.  You have increased redness or swelling. Get help right away if:  You have a fever.  You cannot move your arm or shoulder.  You develop severe numbness or tingling in your arm, hand, or fingers.  Your arm, hand, or fingers feel cold and turn blue, white, or gray.   Follow up as needed

## 2019-05-20 NOTE — Assessment & Plan Note (Signed)
Recurrent UTI - currently treated by PCP - WBC elevated at last check - please follow up on UTI and lab work in 1 week with PCP  Family history of Lupus - Refered to rheumatology at last visit considering myriad of symptoms and recent Covid   Tachycardia - Most likely due to steroid injection and prednisone today - Heart rate WNL today - walked in office - heart rate remained stable.    Plan:  Follow up labs and UA at PCP  Awaiting referal to rheumatology - may need work-up for lupus  Stay well hydrated  Stay active   Follow up in post covid care clinic as needed

## 2019-05-27 ENCOUNTER — Telehealth: Payer: Self-pay

## 2019-05-27 ENCOUNTER — Other Ambulatory Visit: Payer: 59

## 2019-05-27 ENCOUNTER — Other Ambulatory Visit: Payer: Self-pay

## 2019-05-27 DIAGNOSIS — R399 Unspecified symptoms and signs involving the genitourinary system: Secondary | ICD-10-CM

## 2019-05-27 DIAGNOSIS — R7989 Other specified abnormal findings of blood chemistry: Secondary | ICD-10-CM

## 2019-05-27 DIAGNOSIS — U071 COVID-19: Secondary | ICD-10-CM

## 2019-05-27 NOTE — Progress Notes (Signed)
Patient here for repeat UA. Patient has had future labs ordered by a provider at the [facility].

## 2019-05-27 NOTE — Telephone Encounter (Signed)
Patient is requesting a prescription for weight loss be sent to pharmacy on file.  I did inform patient that an appointment would probably be needed since this hasn't been discussed since her visit with a covering provider in January 2021.  Please advise.

## 2019-05-28 LAB — CBC WITH DIFFERENTIAL/PLATELET
Basophils Absolute: 0 10*3/uL (ref 0.0–0.2)
Basos: 0 %
EOS (ABSOLUTE): 0.2 10*3/uL (ref 0.0–0.4)
Eos: 2 %
Hematocrit: 39.9 % (ref 34.0–46.6)
Hemoglobin: 13 g/dL (ref 11.1–15.9)
Immature Grans (Abs): 0 10*3/uL (ref 0.0–0.1)
Immature Granulocytes: 0 %
Lymphocytes Absolute: 2.8 10*3/uL (ref 0.7–3.1)
Lymphs: 29 %
MCH: 26.1 pg — ABNORMAL LOW (ref 26.6–33.0)
MCHC: 32.6 g/dL (ref 31.5–35.7)
MCV: 80 fL (ref 79–97)
Monocytes Absolute: 0.7 10*3/uL (ref 0.1–0.9)
Monocytes: 7 %
Neutrophils Absolute: 6 10*3/uL (ref 1.4–7.0)
Neutrophils: 62 %
Platelets: 343 10*3/uL (ref 150–450)
RBC: 4.98 x10E6/uL (ref 3.77–5.28)
RDW: 18.2 % — ABNORMAL HIGH (ref 11.7–15.4)
WBC: 9.8 10*3/uL (ref 3.4–10.8)

## 2019-05-28 LAB — URINALYSIS, COMPLETE
Bilirubin, UA: NEGATIVE
Glucose, UA: NEGATIVE
Ketones, UA: NEGATIVE
Leukocytes,UA: NEGATIVE
Nitrite, UA: NEGATIVE
Protein,UA: NEGATIVE
RBC, UA: NEGATIVE
Specific Gravity, UA: 1.007 (ref 1.005–1.030)
Urobilinogen, Ur: 0.2 mg/dL (ref 0.2–1.0)
pH, UA: 6 (ref 5.0–7.5)

## 2019-05-28 LAB — MICROSCOPIC EXAMINATION
Casts: NONE SEEN /lpf
RBC, Urine: NONE SEEN /hpf (ref 0–2)
WBC, UA: NONE SEEN /hpf (ref 0–5)

## 2019-05-28 LAB — SARS-COV-2 ANTIBODY, IGM: SARS-CoV-2 Antibody, IgM: NEGATIVE

## 2019-05-28 NOTE — Telephone Encounter (Signed)
Hi Heather Campos,   Yes, Heather Campos will need an appointment to discuss weight loss concerns.   Phill Myron, D.O. Primary Care at [facility]  05/28/2019, 2:37 PM

## 2019-05-28 NOTE — Progress Notes (Signed)
Patient notified of results & recommendations. Expressed understanding.

## 2019-05-28 NOTE — Telephone Encounter (Signed)
Please schedule a phone visit to discuss weight loss concerns.

## 2019-06-04 ENCOUNTER — Encounter: Payer: Self-pay | Admitting: Internal Medicine

## 2019-06-04 ENCOUNTER — Telehealth (INDEPENDENT_AMBULATORY_CARE_PROVIDER_SITE_OTHER): Payer: 59 | Admitting: Internal Medicine

## 2019-06-04 MED ORDER — PHENTERMINE-TOPIRAMATE ER 3.75-23 MG PO CP24: ORAL_CAPSULE | ORAL | 1 refills | Status: DC

## 2019-06-04 NOTE — Progress Notes (Signed)
Virtual Visit via Telephone Note  I connected with Heather Campos, on 06/04/2019 at 4:03 PM by telephone due to the COVID-19 pandemic and verified that I am speaking with the correct person using two identifiers.   Consent: I discussed the limitations, risks, security and privacy concerns of performing an evaluation and management service by telephone and the availability of in person appointments. I also discussed with the patient that there may be a patient responsible charge related to this service. The patient expressed understanding and agreed to proceed.   Location of Patient: Home   Location of Provider: Clinic    Persons participating in Telemedicine visit: [facility] Dr. Juleen China      History of Present Illness: Patient has a follow up to discuss weight concerns. She reports she is walking 4 days per week--walks with her aunt about one mile in 30-40 minutes. Has made some diet changes. Only drinking water now. Has limited her intake of pasta and other carbohydrate. She is increasing veggie intake.   She would like to lose some weight prior to her hysterectomy scheduled on 5/13.    Past Medical History:  Diagnosis Date  . Allergic rhinitis   . Anxiety   . Chronic back pain   . Family history of systemic lupus erythematosus   . GERD (gastroesophageal reflux disease)   . Hypothyroidism due to Hashimoto's thyroiditis   . IDA (iron deficiency anemia)   . Morbid obesity ([place])   . OSA (obstructive sleep apnea)   . Vitamin D deficiency    No Known Allergies  Current Outpatient Medications on File Prior to Visit  Medication Sig Dispense Refill  . clindamycin (CLEOCIN T) 1 % lotion Apply 1 application topically 2 (two) times daily.    Marland Kitchen doxycycline (VIBRAMYCIN) 100 MG capsule Take 100 mg by mouth 2 (two) times daily.    . ferrous sulfate 325 (65 FE) MG tablet Take 1 tablet (325 mg total) by mouth 2 (two) times daily. 180 tablet 0  . fluticasone  (FLONASE) 50 MCG/ACT nasal spray Place 2 sprays into both nostrils daily. 16 g 6  . folic acid (FOLVITE) 1 MG tablet Take 1 tablet (1 mg total) by mouth daily. 90 tablet 0  . levothyroxine (SYNTHROID) 125 MCG tablet Take 1 tablet (125 mcg total) by mouth daily. 90 tablet 0  . omeprazole (PRILOSEC) 40 MG capsule Take 1 capsule (40 mg total) by mouth daily as needed. 90 capsule 1  . Vitamin D, Ergocalciferol, (DRISDOL) 1.25 MG (50000 UNIT) CAPS capsule Take 1 capsule (50,000 Units total) by mouth every 7 (seven) days. 4 capsule 0   No current facility-administered medications on file prior to visit.    Observations/Objective: NAD. Speaking clearly.  Work of breathing normal.  Alert and oriented. Mood appropriate.   Assessment and Plan: 1. Morbid obesity due to excess calories ([place]) Will start on phentermine-topiramate at low dose and increase as tolerated in two weeks. Patient has no significant cardiovascular disease, no h/o hyperthyroidism, no h/o glaucoma, no recent MAO use, and is not currently pregnant. Discussed potential side effects and reasons to d/c the medication. Will plan to monitor weight 12 weeks after the dose increase. Recent BMET on 3/22 was normal. At f/u will obtain monitoring blood work. Monitor pulse. Encouraged increasing distance/pace of walking as well as increasing number of days per week to at least 5 that she is exercising. Continue with diet changes. Prior TSH with h/o hypothyroidism has been well controlled so doubt  related to uncontrolled hypothyroidism that patient struggles with weight loss.  - Phentermine-Topiramate 3.75-23 MG CP24; Take one tablet daily for 14 days then increase to two tablets daily.  Dispense: 90 capsule; Refill: 1   Follow Up Instructions: Follow up in 3-4 months for obesity    I discussed the assessment and treatment plan with the patient. The patient was provided an opportunity to ask questions and all were answered. The patient agreed with  the plan and demonstrated an understanding of the instructions.   The patient was advised to call back or seek an in-person evaluation if the symptoms worsen or if the condition fails to improve as anticipated.     I provided 16 minutes total of non-face-to-face time during this encounter including median intraservice time, reviewing previous notes, investigations, ordering medications, medical decision making, coordinating care and patient verbalized understanding at the end of the visit.    Phill Myron, D.O. Primary Care at [facility]  06/04/2019, 4:03 PM

## 2019-06-13 ENCOUNTER — Other Ambulatory Visit: Payer: Self-pay | Admitting: Internal Medicine

## 2019-06-13 ENCOUNTER — Other Ambulatory Visit: Payer: Self-pay

## 2019-06-13 MED ORDER — FERROUS SULFATE 325 (65 FE) MG PO TABS: 325.0000 mg | ORAL_TABLET | Freq: Two times a day (BID) | ORAL | 0 refills | Status: DC

## 2019-06-13 MED ORDER — LEVOTHYROXINE SODIUM 125 MCG PO TABS: 125.0000 ug | ORAL_TABLET | Freq: Every day | ORAL | 0 refills | Status: DC

## 2019-06-20 ENCOUNTER — Other Ambulatory Visit: Payer: Self-pay | Admitting: Internal Medicine

## 2019-06-25 NOTE — Patient Instructions (Addendum)
DUE TO COVID-19 ONLY ONE VISITOR IS ALLOWED IN WAITING ROOM (VISITOR WILL HAVE A TEMPERATURE CHECK ON ARRIVAL AND MUST WEAR A FACE MASK THE ENTIRE TIME.)  ONCE YOU ARE ADMITTED TO YOUR PRIVATE ROOM, THE SAME ONE VISITOR IS ALLOWED TO VISIT DURING VISITING HOURS ONLY.  Your COVID swab testing is scheduled for: 07/01/19 at: 9:20 am  , You must self quarantine after your testing per handout given to you at the testing site.  ([facility] testing enter pre-surgical testing line)    Your procedure is scheduled on: 07/04/19  Report to [facility] AT: 7:30  A. M.   Call this number if you have problems the morning of [phone].   OUR ADDRESS [place].  WE ARE LOCATED IN THE [place]                                   [facility].                                     REMEMBER:   DO NOT EAT SOLID FOOD AFTER MIDNIGHT .  CLEAR LIQUID DIET FROM MIDNIGHT UNTIL 6:30 am    CLEAR LIQUID DIET   Foods Allowed                                                                     Foods Excluded  Coffee and tea, regular and decaf                             liquids that you cannot  Plain Jell-O any favor except red or purple                                           see through such as: Fruit ices (not with fruit pulp)                                     milk, soups, orange juice  Iced Popsicles                                    All solid food Carbonated beverages, regular and diet                                    Cranberry, grape and apple juices Sports drinks like Gatorade Lightly seasoned clear broth or consume(fat free) Sugar, honey syrup  Sample Menu Breakfast                                Lunch  Supper Cranberry juice                    Beef broth                            Chicken broth Jell-O                                     Grape juice                            Apple juice Coffee or tea                        Jell-O                                      Popsicle                                                Coffee or tea                        Coffee or tea  _____________________________________________________________________   BRUSH YOUR TEETH THE MORNING OF SURGERY.  TAKE THESE MEDICATIONS MORNING OF SURGERY WITH A SIP OF WATER:  Doxycycline,levothyroxine,omeprazole.  DO NOT WEAR JEWERLY, MAKE UP, OR NAIL POLISH.  DO NOT WEAR LOTIONS, POWDERS, PERFUMES/COLOGNE OR DEODORANT.  DO NOT SHAVE FOR 24 HOURS PRIOR TO DAY OF SURGERY.  MEN MAY SHAVE FACE AND NECK.  CONTACTS, GLASSES, OR DENTURES MAY NOT BE WORN TO SURGERY.                                    [facility] IS NOT RESPONSIBLE  FOR ANY BELONGINGS.          BRING ALL PRESCRIPTION MEDICATIONS WITH YOU THE DAY OF SURGERY IN ORIGINAL CONTAINERS                                                               .[facility] - Preparing for Surgery Before surgery, you can play an important role.  Because skin is not sterile, your skin needs to be as free of germs as possible.  You can reduce the number of germs on your skin by washing with CHG (chlorahexidine gluconate) soap before surgery.  CHG is an antiseptic cleaner which kills germs and bonds with the skin to continue killing germs even after washing. Please DO NOT use if you have an allergy to CHG or antibacterial soaps.  If your skin becomes reddened/irritated stop using the CHG and inform your nurse when you arrive at Short Stay. Do not shave (including legs and underarms) for at least 48 hours prior to the first CHG shower.  You may shave your face/neck. Please follow these instructions carefully:  1.  Shower with CHG Soap the night before surgery and the  morning of Surgery.  2.  If you choose to wash your hair, wash your hair first as usual with your  normal  shampoo.  3.  After you shampoo, rinse your hair and body thoroughly to  remove the  shampoo.                           4.  Use CHG as you would any other liquid soap.  You can apply chg directly  to the skin and wash                       Gently with a scrungie or clean washcloth.  5.  Apply the CHG Soap to your body ONLY FROM THE NECK DOWN.   Do not use on face/ open                           Wound or open sores. Avoid contact with eyes, ears mouth and genitals (private parts).                       Wash face,  Genitals (private parts) with your normal soap.             6.  Wash thoroughly, paying special attention to the area where your surgery  will be performed.  7.  Thoroughly rinse your body with warm water from the neck down.  8.  DO NOT shower/wash with your normal soap after using and rinsing off  the CHG Soap.                9.  Pat yourself dry with a clean towel.            10.  Wear clean pajamas.            11.  Place clean sheets on your bed the night of your first shower and do not  sleep with pets. Day of Surgery : Do not apply any lotions/deodorants the morning of surgery.  Please wear clean clothes to the hospital/surgery center.  FAILURE TO FOLLOW THESE INSTRUCTIONS MAY RESULT IN THE CANCELLATION OF YOUR SURGERY PATIENT SIGNATURE_________________________________  NURSE SIGNATURE__________________________________  ________________________________________________________________________

## 2019-06-26 ENCOUNTER — Encounter (HOSPITAL_COMMUNITY)
Admission: RE | Admit: 2019-06-26 | Discharge: 2019-06-26 | Disposition: A | Discharge status: Home / Self Care | Payer: 59 | Source: Ambulatory Visit | Attending: Obstetrics & Gynecology | Admitting: Obstetrics & Gynecology

## 2019-06-26 ENCOUNTER — Other Ambulatory Visit: Payer: Self-pay

## 2019-06-26 ENCOUNTER — Encounter (HOSPITAL_COMMUNITY): Payer: Self-pay

## 2019-06-26 ENCOUNTER — Encounter (HOSPITAL_COMMUNITY): Payer: 59

## 2019-06-26 DIAGNOSIS — Z01812 Encounter for preprocedural laboratory examination: Secondary | ICD-10-CM | POA: Insufficient documentation

## 2019-06-26 LAB — CBC
HCT: 34.9 % — ABNORMAL LOW (ref 36.0–46.0)
Hemoglobin: 11 g/dL — ABNORMAL LOW (ref 12.0–15.0)
MCH: 25.8 pg — ABNORMAL LOW (ref 26.0–34.0)
MCHC: 31.5 g/dL (ref 30.0–36.0)
MCV: 81.9 fL (ref 80.0–100.0)
Platelets: 288 10*3/uL (ref 150–400)
RBC: 4.26 MIL/uL (ref 3.87–5.11)
RDW: 15.3 % (ref 11.5–15.5)
WBC: 8.2 10*3/uL (ref 4.0–10.5)
nRBC: 0 % (ref 0.0–0.2)

## 2019-06-26 NOTE — Progress Notes (Signed)
PCP - Dr. Jobe Gibbon. LOV: 05/20/19 Cardiologist -   Chest x-ray -  EKG -  Stress Test -  ECHO -  Cardiac Cath -   Sleep Study - yes CPAP - no  Fasting Blood Sugar -  Checks Blood Sugar _____ times a day  Blood Thinner Instructions: Aspirin Instructions: Last Dose:  Anesthesia review:   Patient denies shortness of breath, fever, cough and chest pain at PAT appointment   Patient verbalized understanding of instructions that were given to them at the PAT appointment. Patient was also instructed that they will need to review over the PAT instructions again at home before surgery.

## 2019-07-01 ENCOUNTER — Other Ambulatory Visit (HOSPITAL_COMMUNITY)
Admission: RE | Admit: 2019-07-01 | Discharge: 2019-07-01 | Disposition: A | Discharge status: Home / Self Care | Payer: 59 | Source: Ambulatory Visit | Attending: Obstetrics & Gynecology | Admitting: Obstetrics & Gynecology

## 2019-07-01 DIAGNOSIS — Z01812 Encounter for preprocedural laboratory examination: Secondary | ICD-10-CM | POA: Insufficient documentation

## 2019-07-01 DIAGNOSIS — Z20822 Contact with and (suspected) exposure to covid-19: Secondary | ICD-10-CM | POA: Insufficient documentation

## 2019-07-01 LAB — SARS CORONAVIRUS 2 (TAT 6-24 HRS): SARS Coronavirus 2: NEGATIVE

## 2019-07-03 NOTE — Anesthesia Preprocedure Evaluation (Addendum)
Anesthesia Evaluation  Patient identified by MRN, date of birth, ID band Patient awake    Reviewed: Allergy & Precautions, NPO status , Patient's Chart, lab work & pertinent test results  Airway Mallampati: II  TM Distance: >3 FB Neck ROM: Full    Dental no notable dental hx. (+) Teeth Intact, Dental Advisory Given   Pulmonary former smoker,    Pulmonary exam normal breath sounds clear to auscultation       Cardiovascular Exercise Tolerance: Good Normal cardiovascular exam Rhythm:Regular Rate:Normal     Neuro/Psych Anxiety    GI/Hepatic Neg liver ROS, GERD  Medicated,  Endo/Other  Hypothyroidism   Renal/GU      Musculoskeletal negative musculoskeletal ROS (+)   Abdominal (+) + obese,   Peds  Hematology  (+) anemia , hgb 11.0 Plt 288   Anesthesia Other Findings   Reproductive/Obstetrics negative OB ROS                            Anesthesia Physical Anesthesia Plan  ASA: III and emergent  Anesthesia Plan: General   Post-op Pain Management:    Induction: Intravenous  PONV Risk Score and Plan: 4 or greater and Treatment may vary due to age or medical condition, Dexamethasone, Midazolam, Scopolamine patch - Pre-op and Ondansetron  Airway Management Planned: Oral ETT  Additional Equipment: None  Intra-op Plan:   Post-operative Plan: Extubation in OR  Informed Consent: I have reviewed the patients History and Physical, chart, labs and discussed the procedure including the risks, benefits and alternatives for the proposed anesthesia with the patient or authorized representative who has indicated his/her understanding and acceptance.     Dental advisory given  Plan Discussed with: CRNA  Anesthesia Plan Comments:        Anesthesia Quick Evaluation

## 2019-07-04 ENCOUNTER — Encounter (HOSPITAL_COMMUNITY)
Admission: RE | Disposition: A | Discharge status: Home / Self Care | Payer: Self-pay | Source: Home / Self Care | Attending: Obstetrics & Gynecology

## 2019-07-04 ENCOUNTER — Other Ambulatory Visit: Payer: Self-pay

## 2019-07-04 ENCOUNTER — Encounter (HOSPITAL_BASED_OUTPATIENT_CLINIC_OR_DEPARTMENT_OTHER): Payer: Self-pay | Admitting: Obstetrics & Gynecology

## 2019-07-04 ENCOUNTER — Ambulatory Visit (HOSPITAL_BASED_OUTPATIENT_CLINIC_OR_DEPARTMENT_OTHER): Payer: 59 | Admitting: Anesthesiology

## 2019-07-04 ENCOUNTER — Inpatient Hospital Stay (HOSPITAL_BASED_OUTPATIENT_CLINIC_OR_DEPARTMENT_OTHER)
Admission: RE | Admit: 2019-07-04 | Discharge: 2019-07-06 | DRG: 742 | Disposition: A | Discharge status: Home / Self Care | Payer: 59 | Attending: Obstetrics & Gynecology | Admitting: Obstetrics & Gynecology

## 2019-07-04 DIAGNOSIS — E063 Autoimmune thyroiditis: Secondary | ICD-10-CM | POA: Diagnosis not present

## 2019-07-04 DIAGNOSIS — D251 Intramural leiomyoma of uterus: Secondary | ICD-10-CM | POA: Diagnosis present

## 2019-07-04 DIAGNOSIS — D509 Iron deficiency anemia, unspecified: Secondary | ICD-10-CM | POA: Diagnosis present

## 2019-07-04 DIAGNOSIS — Z87891 Personal history of nicotine dependence: Secondary | ICD-10-CM | POA: Diagnosis not present

## 2019-07-04 DIAGNOSIS — N946 Dysmenorrhea, unspecified: Secondary | ICD-10-CM | POA: Diagnosis present

## 2019-07-04 DIAGNOSIS — Z79899 Other long term (current) drug therapy: Secondary | ICD-10-CM

## 2019-07-04 DIAGNOSIS — K219 Gastro-esophageal reflux disease without esophagitis: Secondary | ICD-10-CM | POA: Diagnosis present

## 2019-07-04 DIAGNOSIS — N939 Abnormal uterine and vaginal bleeding, unspecified: Secondary | ICD-10-CM | POA: Diagnosis present

## 2019-07-04 DIAGNOSIS — Z20822 Contact with and (suspected) exposure to covid-19: Secondary | ICD-10-CM | POA: Diagnosis present

## 2019-07-04 DIAGNOSIS — Z6841 Body Mass Index (BMI) 40.0 and over, adult: Secondary | ICD-10-CM | POA: Diagnosis not present

## 2019-07-04 DIAGNOSIS — D259 Leiomyoma of uterus, unspecified: Secondary | ICD-10-CM | POA: Diagnosis present

## 2019-07-04 HISTORY — PX: HYSTERECTOMY ABDOMINAL WITH SALPINGECTOMY: SHX6725

## 2019-07-04 HISTORY — PX: TOTAL LAPAROSCOPIC HYSTERECTOMY WITH SALPINGECTOMY: SHX6742

## 2019-07-04 HISTORY — PX: CYSTOSCOPY: SHX5120

## 2019-07-04 LAB — ABO/RH: ABO/RH(D): O POS

## 2019-07-04 LAB — TYPE AND SCREEN
ABO/RH(D): O POS
Antibody Screen: NEGATIVE

## 2019-07-04 LAB — POCT PREGNANCY, URINE: Preg Test, Ur: NEGATIVE

## 2019-07-04 SURGERY — HYSTERECTOMY, TOTAL, LAPAROSCOPIC, WITH SALPINGECTOMY
Anesthesia: General | Site: Uterus | Laterality: Bilateral

## 2019-07-04 MED ORDER — KETOROLAC TROMETHAMINE 30 MG/ML IJ SOLN
INTRAMUSCULAR | Status: AC
  Filled 2019-07-04: qty 1

## 2019-07-04 MED ORDER — ACETAMINOPHEN 10 MG/ML IV SOLN
INTRAVENOUS | Status: DC | PRN
  Administered 2019-07-04: 1000 mg via INTRAVENOUS

## 2019-07-04 MED ORDER — FENTANYL CITRATE (PF) 250 MCG/5ML IJ SOLN
INTRAMUSCULAR | Status: AC
  Filled 2019-07-04: qty 5

## 2019-07-04 MED ORDER — LIDOCAINE HCL 2 % IJ SOLN
INTRAMUSCULAR | Status: AC
  Filled 2019-07-04: qty 20

## 2019-07-04 MED ORDER — IBUPROFEN 400 MG PO TABS
600.0000 mg | ORAL_TABLET | Freq: Four times a day (QID) | ORAL | Status: DC
  Administered 2019-07-06 (×2): 600 mg via ORAL
  Filled 2019-07-04 (×4): qty 1

## 2019-07-04 MED ORDER — ALBUMIN HUMAN 5 % IV SOLN
INTRAVENOUS | Status: DC | PRN
Start: 2019-07-04 — End: 2019-07-04

## 2019-07-04 MED ORDER — KETAMINE HCL 10 MG/ML IJ SOLN
INTRAMUSCULAR | Status: AC
  Filled 2019-07-04: qty 1

## 2019-07-04 MED ORDER — BUPIVACAINE HCL (PF) 0.5 % IJ SOLN
INTRAMUSCULAR | Status: DC | PRN
  Administered 2019-07-04: 35 mL

## 2019-07-04 MED ORDER — SCOPOLAMINE 1 MG/3DAYS TD PT72
MEDICATED_PATCH | TRANSDERMAL | Status: AC
  Filled 2019-07-04: qty 1

## 2019-07-04 MED ORDER — HYDROMORPHONE HCL 1 MG/ML IJ SOLN
INTRAMUSCULAR | Status: AC
  Filled 2019-07-04: qty 1

## 2019-07-04 MED ORDER — ONDANSETRON HCL 4 MG/2ML IJ SOLN
INTRAMUSCULAR | Status: AC
  Filled 2019-07-04: qty 2

## 2019-07-04 MED ORDER — SCOPOLAMINE 1 MG/3DAYS TD PT72
MEDICATED_PATCH | TRANSDERMAL | Status: DC | PRN
  Administered 2019-07-04: 1 via TRANSDERMAL

## 2019-07-04 MED ORDER — BUPIVACAINE HCL (PF) 0.25 % IJ SOLN
INTRAMUSCULAR | Status: DC | PRN
  Administered 2019-07-04: 11 mL

## 2019-07-04 MED ORDER — ARTIFICIAL TEARS OPHTHALMIC OINT
TOPICAL_OINTMENT | OPHTHALMIC | Status: AC
  Filled 2019-07-04: qty 3.5

## 2019-07-04 MED ORDER — ONDANSETRON HCL 4 MG PO TABS: 4.0000 mg | ORAL_TABLET | Freq: Four times a day (QID) | ORAL | Status: DC | PRN

## 2019-07-04 MED ORDER — DIPHENHYDRAMINE HCL 12.5 MG/5ML PO ELIX: 12.5000 mg | ORAL_SOLUTION | Freq: Four times a day (QID) | ORAL | Status: DC | PRN

## 2019-07-04 MED ORDER — ONDANSETRON HCL 4 MG/2ML IJ SOLN: 4.0000 mg | Freq: Four times a day (QID) | INTRAMUSCULAR | Status: DC | PRN

## 2019-07-04 MED ORDER — ACETAMINOPHEN 10 MG/ML IV SOLN: 1000.0000 mg | Freq: Once | INTRAVENOUS | Status: DC | PRN

## 2019-07-04 MED ORDER — LACTATED RINGERS IV SOLN: INTRAVENOUS | Status: DC

## 2019-07-04 MED ORDER — KETOROLAC TROMETHAMINE 30 MG/ML IJ SOLN
INTRAMUSCULAR | Status: DC | PRN
  Administered 2019-07-04: 30 mg via INTRAVENOUS

## 2019-07-04 MED ORDER — SUGAMMADEX SODIUM 200 MG/2ML IV SOLN
INTRAVENOUS | Status: DC | PRN
  Administered 2019-07-04: 200 mg via INTRAVENOUS

## 2019-07-04 MED ORDER — MIDAZOLAM HCL 2 MG/2ML IJ SOLN
INTRAMUSCULAR | Status: DC | PRN
  Administered 2019-07-04: 2 mg via INTRAVENOUS

## 2019-07-04 MED ORDER — DEXAMETHASONE SODIUM PHOSPHATE 10 MG/ML IJ SOLN
INTRAMUSCULAR | Status: DC | PRN
  Administered 2019-07-04: 10 mg via INTRAVENOUS

## 2019-07-04 MED ORDER — SODIUM CHLORIDE 0.9 % IR SOLN
Status: DC | PRN
  Administered 2019-07-04: 1

## 2019-07-04 MED ORDER — STERILE WATER FOR IRRIGATION IR SOLN
Status: DC | PRN
  Administered 2019-07-04: 1000 mL

## 2019-07-04 MED ORDER — LEVOTHYROXINE SODIUM 125 MCG PO TABS
125.0000 ug | ORAL_TABLET | Freq: Every day | ORAL | Status: DC
  Administered 2019-07-05 – 2019-07-06 (×2): 125 ug via ORAL
  Filled 2019-07-04 (×2): qty 1

## 2019-07-04 MED ORDER — LIDOCAINE 2% (20 MG/ML) 5 ML SYRINGE
INTRAMUSCULAR | Status: DC | PRN
  Administered 2019-07-04: 1.5 mg/kg/h via INTRAVENOUS

## 2019-07-04 MED ORDER — KETOROLAC TROMETHAMINE 30 MG/ML IJ SOLN
30.0000 mg | Freq: Four times a day (QID) | INTRAMUSCULAR | Status: AC
  Administered 2019-07-04 – 2019-07-05 (×4): 30 mg via INTRAVENOUS
  Filled 2019-07-04 (×4): qty 1

## 2019-07-04 MED ORDER — BUPIVACAINE LIPOSOME 1.3 % IJ SUSP
INTRAMUSCULAR | Status: AC
  Filled 2019-07-04: qty 20

## 2019-07-04 MED ORDER — HYDROMORPHONE HCL 1 MG/ML IJ SOLN
1.0000 mg | INTRAMUSCULAR | Status: DC | PRN
  Administered 2019-07-04 – 2019-07-06 (×10): 1 mg via INTRAVENOUS
  Filled 2019-07-04 (×11): qty 1

## 2019-07-04 MED ORDER — FERROUS SULFATE 325 (65 FE) MG PO TABS
325.0000 mg | ORAL_TABLET | Freq: Two times a day (BID) | ORAL | Status: DC
  Administered 2019-07-04 – 2019-07-06 (×4): 325 mg via ORAL
  Filled 2019-07-04 (×4): qty 1

## 2019-07-04 MED ORDER — LIDOCAINE 2% (20 MG/ML) 5 ML SYRINGE
INTRAMUSCULAR | Status: DC | PRN
  Administered 2019-07-04: 100 mg via INTRAVENOUS

## 2019-07-04 MED ORDER — ALBUMIN HUMAN 5 % IV SOLN
INTRAVENOUS | Status: AC
  Filled 2019-07-04: qty 250

## 2019-07-04 MED ORDER — LIDOCAINE 2% (20 MG/ML) 5 ML SYRINGE
INTRAMUSCULAR | Status: AC
  Filled 2019-07-04: qty 5

## 2019-07-04 MED ORDER — KETAMINE HCL 10 MG/ML IJ SOLN
INTRAMUSCULAR | Status: DC | PRN
  Administered 2019-07-04: 40 mg via INTRAVENOUS

## 2019-07-04 MED ORDER — OXYCODONE-ACETAMINOPHEN 5-325 MG PO TABS
1.0000 | ORAL_TABLET | Freq: Four times a day (QID) | ORAL | Status: DC | PRN
  Administered 2019-07-04 – 2019-07-06 (×4): 2 via ORAL
  Filled 2019-07-04 (×4): qty 2

## 2019-07-04 MED ORDER — DOCUSATE SODIUM 100 MG PO CAPS
100.0000 mg | ORAL_CAPSULE | Freq: Two times a day (BID) | ORAL | Status: DC
  Administered 2019-07-04 – 2019-07-06 (×4): 100 mg via ORAL
  Filled 2019-07-04 (×4): qty 1

## 2019-07-04 MED ORDER — ACETAMINOPHEN 10 MG/ML IV SOLN
INTRAVENOUS | Status: AC
  Filled 2019-07-04: qty 100

## 2019-07-04 MED ORDER — MIDAZOLAM HCL 2 MG/2ML IJ SOLN
INTRAMUSCULAR | Status: AC
  Filled 2019-07-04: qty 2

## 2019-07-04 MED ORDER — NALOXONE HCL 0.4 MG/ML IJ SOLN: 0.4000 mg | INTRAMUSCULAR | Status: DC | PRN

## 2019-07-04 MED ORDER — FENTANYL CITRATE (PF) 100 MCG/2ML IJ SOLN
INTRAMUSCULAR | Status: DC | PRN
  Administered 2019-07-04 (×5): 50 ug via INTRAVENOUS

## 2019-07-04 MED ORDER — HYDROCODONE-ACETAMINOPHEN 7.5-325 MG PO TABS: 1.0000 | ORAL_TABLET | Freq: Once | ORAL | Status: DC | PRN

## 2019-07-04 MED ORDER — DEXAMETHASONE SODIUM PHOSPHATE 10 MG/ML IJ SOLN
INTRAMUSCULAR | Status: AC
  Filled 2019-07-04: qty 1

## 2019-07-04 MED ORDER — MENTHOL 3 MG MT LOZG
1.0000 | LOZENGE | OROMUCOSAL | Status: DC | PRN
  Filled 2019-07-04: qty 9

## 2019-07-04 MED ORDER — SODIUM CHLORIDE 0.9 % IV SOLN
2.0000 g | INTRAVENOUS | Status: AC
  Administered 2019-07-04: 2 g via INTRAVENOUS

## 2019-07-04 MED ORDER — ONDANSETRON HCL 4 MG/2ML IJ SOLN: 4.0000 mg | Freq: Once | INTRAMUSCULAR | Status: DC | PRN

## 2019-07-04 MED ORDER — ONDANSETRON HCL 4 MG/2ML IJ SOLN
INTRAMUSCULAR | Status: DC | PRN
  Administered 2019-07-04 (×2): 4 mg via INTRAVENOUS

## 2019-07-04 MED ORDER — PANTOPRAZOLE SODIUM 40 MG PO TBEC
40.0000 mg | DELAYED_RELEASE_TABLET | Freq: Every day | ORAL | Status: DC
  Administered 2019-07-04 – 2019-07-06 (×3): 40 mg via ORAL
  Filled 2019-07-04 (×3): qty 1

## 2019-07-04 MED ORDER — PHENYLEPHRINE 40 MCG/ML (10ML) SYRINGE FOR IV PUSH (FOR BLOOD PRESSURE SUPPORT)
PREFILLED_SYRINGE | INTRAVENOUS | Status: DC | PRN
  Administered 2019-07-04: 120 ug via INTRAVENOUS
  Administered 2019-07-04 (×2): 80 ug via INTRAVENOUS
  Administered 2019-07-04: 120 ug via INTRAVENOUS

## 2019-07-04 MED ORDER — DIPHENHYDRAMINE HCL 50 MG/ML IJ SOLN: 12.5000 mg | Freq: Four times a day (QID) | INTRAMUSCULAR | Status: DC | PRN

## 2019-07-04 MED ORDER — ROCURONIUM BROMIDE 10 MG/ML (PF) SYRINGE
PREFILLED_SYRINGE | INTRAVENOUS | Status: DC | PRN
  Administered 2019-07-04: 70 mg via INTRAVENOUS
  Administered 2019-07-04: 10 mg via INTRAVENOUS

## 2019-07-04 MED ORDER — SIMETHICONE 80 MG PO CHEW: 80.0000 mg | CHEWABLE_TABLET | Freq: Four times a day (QID) | ORAL | Status: DC | PRN

## 2019-07-04 MED ORDER — PROPOFOL 10 MG/ML IV BOLUS
INTRAVENOUS | Status: DC | PRN
  Administered 2019-07-04: 200 mg via INTRAVENOUS

## 2019-07-04 MED ORDER — SODIUM CHLORIDE 0.9 % IV SOLN
INTRAVENOUS | Status: AC
  Filled 2019-07-04: qty 2

## 2019-07-04 MED ORDER — SODIUM CHLORIDE 0.9% FLUSH: 9.0000 mL | INTRAVENOUS | Status: DC | PRN

## 2019-07-04 MED ORDER — HYDROMORPHONE 1 MG/ML IV SOLN: INTRAVENOUS | Status: DC

## 2019-07-04 MED ORDER — HYDROMORPHONE HCL 1 MG/ML IJ SOLN
0.2500 mg | INTRAMUSCULAR | Status: DC | PRN
  Administered 2019-07-04 (×3): 0.5 mg via INTRAVENOUS

## 2019-07-04 MED ORDER — PROPOFOL 10 MG/ML IV BOLUS
INTRAVENOUS | Status: AC
  Filled 2019-07-04: qty 20

## 2019-07-04 MED ORDER — BUPIVACAINE LIPOSOME 1.3 % IJ SUSP
INTRAMUSCULAR | Status: DC | PRN
  Administered 2019-07-04: 25 mL

## 2019-07-04 MED ORDER — BUPIVACAINE HCL (PF) 0.5 % IJ SOLN
INTRAMUSCULAR | Status: AC
  Filled 2019-07-04: qty 30

## 2019-07-04 MED ORDER — PHENYLEPHRINE 40 MCG/ML (10ML) SYRINGE FOR IV PUSH (FOR BLOOD PRESSURE SUPPORT)
PREFILLED_SYRINGE | INTRAVENOUS | Status: AC
  Filled 2019-07-04: qty 10

## 2019-07-04 MED ORDER — ROCURONIUM BROMIDE 10 MG/ML (PF) SYRINGE
PREFILLED_SYRINGE | INTRAVENOUS | Status: AC
  Filled 2019-07-04: qty 10

## 2019-07-04 SURGICAL SUPPLY — 85 items
BARRIER ADHS 3X4 INTERCEED (GAUZE/BANDAGES/DRESSINGS) ×4 IMPLANT
BLADE EXTENDED COATED 6.5IN (ELECTRODE) ×1 IMPLANT
CABLE HIGH FREQUENCY MONO STRZ (ELECTRODE) ×4 IMPLANT
CANISTER SUCT 3000ML PPV (MISCELLANEOUS) ×4 IMPLANT
CLEANER CAUTERY TIP 5X5 PAD (MISCELLANEOUS) IMPLANT
COUNTER NEEDLE 1200 MAGNETIC (NEEDLE) ×1 IMPLANT
COVER MAYO STAND STRL (DRAPES) ×4 IMPLANT
COVER WAND RF STERILE (DRAPES) ×4 IMPLANT
DECANTER SPIKE VIAL GLASS SM (MISCELLANEOUS) IMPLANT
DERMABOND ADVANCED (GAUZE/BANDAGES/DRESSINGS) ×1
DERMABOND ADVANCED .7 DNX12 (GAUZE/BANDAGES/DRESSINGS) ×3 IMPLANT
DEVICE SUTURE ENDOST 10MM (ENDOMECHANICALS) IMPLANT
DISSECTOR BLUNT TIP ENDO 5MM (MISCELLANEOUS) IMPLANT
DRAPE UNDERBUTTOCKS STRL (DISPOSABLE) ×4 IMPLANT
DRAPE WARM FLUID 44X44 (DRAPES) IMPLANT
DRSG COVADERM PLUS 2X2 (GAUZE/BANDAGES/DRESSINGS) IMPLANT
DRSG OPSITE 6X11 MED (GAUZE/BANDAGES/DRESSINGS) ×1 IMPLANT
DRSG OPSITE POSTOP 3X4 (GAUZE/BANDAGES/DRESSINGS) ×1 IMPLANT
DRSG OPSITE POSTOP 4X10 (GAUZE/BANDAGES/DRESSINGS) ×4 IMPLANT
DRSG PAD ABDOMINAL 8X10 ST (GAUZE/BANDAGES/DRESSINGS) ×1 IMPLANT
DURAPREP 26ML APPLICATOR (WOUND CARE) ×4 IMPLANT
GAUZE 4X4 16PLY RFD (DISPOSABLE) ×4 IMPLANT
GAUZE SPONGE 4X4 12PLY STRL (GAUZE/BANDAGES/DRESSINGS) ×1 IMPLANT
GLOVE BIO SURGEON STRL SZ 6.5 (GLOVE) ×8 IMPLANT
GLOVE BIO SURGEON STRL SZ7 (GLOVE) ×4 IMPLANT
GLOVE BIOGEL PI IND STRL 7.0 (GLOVE) ×12 IMPLANT
GLOVE BIOGEL PI INDICATOR 7.0 (GLOVE) ×4
GOWN STRL REUS W/ TWL LRG LVL3 (GOWN DISPOSABLE) ×9 IMPLANT
GOWN STRL REUS W/TWL LRG LVL3 (GOWN DISPOSABLE) ×3
HEMOSTAT ARISTA ABSORB 3G PWDR (HEMOSTASIS) ×1 IMPLANT
HIBICLENS CHG 4% 4OZ (MISCELLANEOUS) ×4 IMPLANT
KIT TURNOVER CYSTO (KITS) ×4 IMPLANT
LIGASURE IMPACT 36 18CM CVD LR (INSTRUMENTS) ×1 IMPLANT
LIGASURE VESSEL 5MM BLUNT TIP (ELECTROSURGICAL) ×4 IMPLANT
MANIPULATOR ADVINCU DEL 3.0 PL (MISCELLANEOUS) IMPLANT
MANIPULATOR ADVINCU DEL 3.5 PL (MISCELLANEOUS) ×1 IMPLANT
MANIPULATOR ADVINCU DEL 4.0 PL (MISCELLANEOUS) IMPLANT
MANIPULATOR VCARE LG CRV RETR (MISCELLANEOUS) IMPLANT
MANIPULATOR VCARE SML CRV RETR (MISCELLANEOUS) IMPLANT
NEEDLE HYPO 22GX1.5 SAFETY (NEEDLE) ×4 IMPLANT
NS IRRIG 1000ML POUR BTL (IV SOLUTION) ×4 IMPLANT
PACK ABDOMINAL (CUSTOM PROCEDURE TRAY) ×4 IMPLANT
PACK LAPAROSCOPY BASIN (CUSTOM PROCEDURE TRAY) ×4 IMPLANT
PACK TRENDGUARD 450 HYBRID PRO (MISCELLANEOUS) IMPLANT
PAD ARMBOARD 7.5X6 YLW CONV (MISCELLANEOUS) ×4 IMPLANT
PAD CLEANER CAUTERY TIP 5X5 (MISCELLANEOUS) ×1
PAD OB MATERNITY 4.3X12.25 (PERSONAL CARE ITEMS) ×4 IMPLANT
PROTECTOR NERVE ULNAR (MISCELLANEOUS) ×8 IMPLANT
RTRCTR C-SECT PINK 25CM LRG (MISCELLANEOUS) ×1 IMPLANT
SCISSORS LAP 5X35 DISP (ENDOMECHANICALS) ×4 IMPLANT
SET IRRIG Y TYPE TUR BLADDER L (SET/KITS/TRAYS/PACK) IMPLANT
SET SUCTION IRRIG HYDROSURG (IRRIGATION / IRRIGATOR) ×4 IMPLANT
SET TRI-LUMEN FLTR TB AIRSEAL (TUBING) ×4 IMPLANT
SHEARS HARMONIC ACE PLUS 36CM (ENDOMECHANICALS) IMPLANT
SHEET LAVH (DRAPES) ×4 IMPLANT
SPONGE LAP 18X18 RF (DISPOSABLE) ×10 IMPLANT
SUT CHROMIC 2 0 CT 1 (SUTURE) ×1 IMPLANT
SUT ENDO VLOC 180-0-8IN (SUTURE) IMPLANT
SUT MNCRL AB 4-0 PS2 18 (SUTURE) ×8 IMPLANT
SUT PDS AB 0 CT 36 (SUTURE) IMPLANT
SUT PLAIN 2 0 (SUTURE)
SUT PLAIN 2 0 XLH (SUTURE) IMPLANT
SUT PLAIN ABS 2-0 54XMFL TIE (SUTURE) IMPLANT
SUT VIC AB 0 CT1 18XCR BRD8 (SUTURE) IMPLANT
SUT VIC AB 0 CT1 27 (SUTURE) ×6
SUT VIC AB 0 CT1 27XBRD ANBCTR (SUTURE) ×6 IMPLANT
SUT VIC AB 0 CT1 27XCR 8 STRN (SUTURE) ×9 IMPLANT
SUT VIC AB 0 CT1 8-18 (SUTURE) ×2
SUT VIC AB 2-0 CT1 (SUTURE) ×4 IMPLANT
SUT VIC AB 4-0 KS 27 (SUTURE) ×4 IMPLANT
SUT VICRYL 0 TIES 12 18 (SUTURE) ×4 IMPLANT
SUT VICRYL 0 UR6 27IN ABS (SUTURE) ×4 IMPLANT
SUT VLOC 180 0 9IN  GS21 (SUTURE)
SUT VLOC 180 0 9IN GS21 (SUTURE) IMPLANT
SYR 10ML LL (SYRINGE) ×1 IMPLANT
SYR 50ML LL SCALE MARK (SYRINGE) IMPLANT
SYR BULB IRRIG 60ML STRL (SYRINGE) ×4 IMPLANT
SYR CONTROL 10ML LL (SYRINGE) ×4 IMPLANT
TOWEL OR 17X26 10 PK STRL BLUE (TOWEL DISPOSABLE) ×8 IMPLANT
TRAY FOLEY W/BAG SLVR 14FR (SET/KITS/TRAYS/PACK) ×4 IMPLANT
TRENDGUARD 450 HYBRID PRO PACK (MISCELLANEOUS)
TROCAR BLADELESS OPT 5 100 (ENDOMECHANICALS) ×8 IMPLANT
TROCAR PORT AIRSEAL 8X120 (TROCAR) ×1 IMPLANT
TROCAR XCEL NON-BLD 11X100MML (ENDOMECHANICALS) ×4 IMPLANT
WARMER LAPAROSCOPE (MISCELLANEOUS) ×4 IMPLANT

## 2019-07-04 NOTE — Anesthesia Postprocedure Evaluation (Signed)
Anesthesia Post Note  Patient: Heather Campos  Procedure(s) Performed: attemted TOTAL LAPAROSCOPIC HYSTERECTOMY WITH SALPINGECTOMY (Bilateral Uterus) ABDOMINAL  suprcervical hysterctomyWITH SALPINGECTOMY  (Bilateral ) CYSTOSCOPY     Patient location during evaluation: PACU Anesthesia Type: General Level of consciousness: awake and alert Pain management: pain level controlled Vital Signs Assessment: post-procedure vital signs reviewed and stable Respiratory status: spontaneous breathing, nonlabored ventilation, respiratory function stable and patient connected to nasal cannula oxygen Cardiovascular status: blood pressure returned to baseline and stable Postop Assessment: no apparent nausea or vomiting Anesthetic complications: no    Last Vitals:  Vitals:   07/04/19 0758 07/04/19 1252  BP: (!) 142/92   Pulse: 98 67  Resp: 18 12  Temp: 36.9 C 36.8 C  SpO2: 99% 95%    Last Pain:  Vitals:   07/04/19 0758  TempSrc: Oral                 Barnet Glasgow

## 2019-07-04 NOTE — Anesthesia Procedure Notes (Signed)
Procedure Name: Intubation Date/Time: 07/04/2019 9:40 AM Performed by: Mechele Claude, CRNA Pre-anesthesia Checklist: Patient identified, Emergency Drugs available, Suction available and Patient being monitored Patient Re-evaluated:Patient Re-evaluated prior to induction Oxygen Delivery Method: Circle system utilized Preoxygenation: Pre-oxygenation with 100% oxygen Induction Type: IV induction Ventilation: Mask ventilation without difficulty Laryngoscope Size: Mac and 3 Grade View: Grade I Tube type: Oral Tube size: 7.0 mm Number of attempts: 1 Airway Equipment and Method: Stylet and Oral airway Placement Confirmation: ETT inserted through vocal cords under direct vision,  positive ETCO2 and breath sounds checked- equal and bilateral Secured at: 21 cm Tube secured with: Tape Dental Injury: Teeth and Oropharynx as per pre-operative assessment

## 2019-07-04 NOTE — Transfer of Care (Signed)
  Last Vitals:  Vitals Value Taken Time  BP    Temp    Pulse 79 07/04/19 1256  Resp 19 07/04/19 1256  SpO2 100 % 07/04/19 1256  Vitals shown include unvalidated device data.  Last Pain:  Vitals:   07/04/19 0758  TempSrc: Oral      Patients Stated Pain Goal: 1 (07/04/19 0758)  Immediate Anesthesia Transfer of Care Note  Patient: Heather Campos  Procedure(s) Performed: Procedure(s) (LRB): attemted TOTAL LAPAROSCOPIC HYSTERECTOMY WITH SALPINGECTOMY (Bilateral) ABDOMINAL  suprcervical hysterctomyWITH SALPINGECTOMY  (Bilateral) CYSTOSCOPY  Patient Location: PACU  Anesthesia Type: General  Level of Consciousness: awake, alert  and oriented  Airway & Oxygen Therapy: Patient Spontanous Breathing and Patient connected to nasal cannula oxygen  Post-op Assessment: Report given to PACU RN and Post -op Vital signs reviewed and stable  Post vital signs: Reviewed and stable  Complications: No apparent anesthesia complications

## 2019-07-04 NOTE — OR PostOp (Signed)
PACU TO INPATIENT HANDOFF REPORT  Name/Age/Gender Heather Campos 44 y.o. female  Code Status   Home/SNF/Other Home  Chief Complaint Fibroid uterus [D25.9] Uterine fibroid [D25.9]  Level of Care/Admitting Diagnosis ED Disposition     None       Medical History Past Medical History:  Diagnosis Date  . Allergic rhinitis   . Anxiety   . Chronic back pain   . Family history of systemic lupus erythematosus   . GERD (gastroesophageal reflux disease)   . Hypothyroidism due to Hashimoto's thyroiditis   . IDA (iron deficiency anemia)   . Morbid obesity ([place])   . OSA (obstructive sleep apnea)    No CPAP  . Vitamin D deficiency     Allergies No Known Allergies  IV Location/Drains/Wounds Patient Lines/Drains/Airways Status    Active Line/Drains/Airways     Name:   Placement date:   Placement time:   Site:   Days:   Peripheral IV 07/04/19 Anterior;Left Hand   07/04/19    0812    Hand   less than 1   Urethral Catheter md pinn Latex 14 Fr.   07/04/19    --    Latex   less than 1   Incision (Closed) 07/04/19 Abdomen   07/04/19    1225     less than 1   Incision (Closed) 07/04/19 Vagina Other (Comment)   07/04/19    1226     less than 1   Incision - 1 Port Abdomen 1: Umbilicus   123456    123456     less than 1            Labs/Imaging Results for orders placed or performed during the hospital encounter of 07/04/19 (from the past 48 hour(s))  Pregnancy, urine POC     Status: None   Collection Time: 07/04/19  7:33 AM  Result Value Ref Range   Preg Test, Ur NEGATIVE NEGATIVE    Comment:        THE SENSITIVITY OF THIS METHODOLOGY IS >24 mIU/mL   Type and screen [place]     Status: None   Collection Time: 07/04/19  8:00 AM  Result Value Ref Range   ABO/RH(D) O POS    Antibody Screen NEG    Sample Expiration      07/07/2019,2359 Performed at [facility], [place] [address] [place], [place] [address]   ABO/Rh     Status:  None   Collection Time: 07/04/19  8:00 AM  Result Value Ref Range   ABO/RH(D)      O POS Performed at [facility], [place] [address] [place], [place] [address]    No results found.  Pending Labs    Vitals/Pain Today's Vitals   07/04/19 1415 07/04/19 1430 07/04/19 1445 07/04/19 1500  BP: 130/78 130/86 124/75 132/71  Pulse: 74 80 78 81  Resp: 16 17 18 15   Temp:      TempSrc:      SpO2: 97% 94% 95% 93%  Weight:      Height:      PainSc:  8       Isolation Precautions @ISOLATION @  Administered Medications Periop Administered Meds from 07/04/2019 0727 to 07/04/2019 1513       Date/Time Order Dose Route Action Action by Comments    07/04/2019 0950 acetaminophen (OFIRMEV) IV 1,000 mg Intravenous Given Mechele Claude, CRNA     07/04/2019 1205 albumin human 5 % solution 0  Intravenous Stopped Mechele Claude, CRNA     07/04/2019 1148 albumin human 5 % solution   Intravenous New Bag/Given Mechele Claude, CRNA     07/04/2019 1207 bupivacaine (MARCAINE) 0.5 % injection 35 mL Infiltration Given Sanjuana Kava, MD     07/04/2019 1046 bupivacaine (PF) (MARCAINE) 0.25 % injection 11 mL Infiltration Given Sanjuana Kava, MD     07/04/2019 1207 bupivacaine liposome (EXPAREL) 1.3 % injection 25 mL Infiltration Given Sanjuana Kava, MD     07/04/2019 0943 cefoTEtan (CEFOTAN) 2 g in sodium chloride 0.9 % 100 mL IVPB 2 g Intravenous New Bag/Given Mechele Claude, CRNA     07/04/2019 0949 dexamethasone (DECADRON) injection 10 mg Intravenous Given Mechele Claude, CRNA     07/04/2019 1238 fentaNYL (SUBLIMAZE) injection 50 mcg Intravenous Given Mechele Claude, CRNA     07/04/2019 1114 fentaNYL (SUBLIMAZE) injection 50 mcg Intravenous Given Mechele Claude, CRNA     07/04/2019 1018 fentaNYL (SUBLIMAZE) injection 50 mcg Intravenous Given Mechele Claude, CRNA     07/04/2019 0933 fentaNYL (SUBLIMAZE) injection 50 mcg Intravenous Given Mechele Claude, CRNA      07/04/2019 0927 fentaNYL (SUBLIMAZE) injection 50 mcg Intravenous Given Mechele Claude, CRNA     07/04/2019 1448 HYDROmorphone (DILAUDID) injection 0.25-0.5 mg 0.5 mg Intravenous Given Dorrene German, RN     07/04/2019 1333 HYDROmorphone (DILAUDID) injection 0.25-0.5 mg 0.5 mg Intravenous Given Dorrene German, RN     07/04/2019 1312 HYDROmorphone (DILAUDID) injection 0.25-0.5 mg 0.5 mg Intravenous Given Dorrene German, RN     07/04/2019 [id number] ketamine (KETALAR) injection 40 mg Intravenous Given Mechele Claude, CRNA     07/04/2019 1224 ketorolac (TORADOL) 30 MG/ML injection 30 mg Intravenous Given Lind Covert, CRNA     07/04/2019 1414 lactated ringers infusion   Intravenous New Bag/Given Dorrene German, [place]     07/04/2019 1257 lactated ringers infusion   Intravenous Anesthesia Volume Adjustment Mechele Claude, CRNA     07/04/2019 1233 lactated ringers infusion   Intravenous Anesthesia Volume Adjustment Mechele Claude, CRNA     07/04/2019 1206 lactated ringers infusion   Intravenous New Bag/Given Mechele Claude, CRNA     07/04/2019 1149 lactated ringers infusion   Intravenous Anesthesia Volume Adjustment Mechele Claude, CRNA     07/04/2019 1137 lactated ringers infusion   Intravenous Anesthesia Volume Adjustment Mechele Claude, CRNA     07/04/2019 1125 lactated ringers infusion   Intravenous Anesthesia Volume Adjustment Mechele Claude, CRNA     07/04/2019 1106 lactated ringers infusion   Intravenous Anesthesia Volume Adjustment Mechele Claude, CRNA     07/04/2019 1020 lactated ringers infusion   Intravenous New Bag/Given Mechele Claude, CRNA     07/04/2019 [id number] lactated ringers infusion   Intravenous Restarted Mechele Claude, CRNA     07/04/2019 0932 lactated ringers infusion   Intravenous Paused Mechele Claude, CRNA Switch to gravity    07/04/2019 [id number] lactated ringers infusion   Intravenous New Bag/Given Myrtie Cruise, RN     07/04/2019 [id number]  lidocaine 2% (20 mg/mL) 5 mL syringe 100 mg Intravenous Given Mechele Claude, CRNA     07/04/2019 1154 lidocaine 2% (20 mg/mL) 5 mL syringe 0 mg/kg/hr Intravenous Stopped Mechele Claude, CRNA     07/04/2019 0945 lidocaine 2% (20 mg/mL) 5 mL syringe 1.5 mg/kg/hr Intravenous New Bag/Given Mechele Claude, CRNA     07/04/2019 0927 midazolam (VERSED)  injection 2 mg Intravenous Given Mechele Claude, CRNA     07/04/2019 1224 ondansetron (ZOFRAN) injection 4 mg Intravenous Given Lind Covert, CRNA     07/04/2019 0949 ondansetron (ZOFRAN) injection 4 mg Intravenous Given Mechele Claude, CRNA     07/04/2019 1137 PHENYLephrine 40 mcg/ml in normal saline Adult IV Push Syringe (For Blood Pressure Support) 120 mcg Intravenous Given Mechele Claude, CRNA     07/04/2019 1125 PHENYLephrine 40 mcg/ml in normal saline Adult IV Push Syringe (For Blood Pressure Support) 80 mcg Intravenous Given Mechele Claude, CRNA     07/04/2019 1049 PHENYLephrine 40 mcg/ml in normal saline Adult IV Push Syringe (For Blood Pressure Support) 80 mcg Intravenous Given Mechele Claude, CRNA     07/04/2019 1001 PHENYLephrine 40 mcg/ml in normal saline Adult IV Push Syringe (For Blood Pressure Support) 120 mcg Intravenous Given Mechele Claude, CRNA     07/04/2019 0937 propofol (DIPRIVAN) 10 mg/mL bolus/IV push 200 mg Intravenous Given Mechele Claude, CRNA     07/04/2019 1114 rocuronium bromide 10 mg/mL (PF) syringe 10 mg Intravenous Given Mechele Claude, CRNA     07/04/2019 0937 rocuronium bromide 10 mg/mL (PF) syringe 70 mg Intravenous Given Mechele Claude, CRNA     07/04/2019 0945 scopolamine (TRANSDERM-SCOP) 1 MG/3DAYS 1 patch Transdermal Given Mechele Claude, CRNA behind right ear    07/04/2019 1045 sodium chloride irrigation 0.9 % 1 application Irrigation Given Sanjuana Kava, MD     07/04/2019 1208 sterile water for irrigation for irrigation 1,000 mL Irrigation Given Sanjuana Kava, MD     07/04/2019  1238 sugammadex sodium (BRIDION) injection 200 mg Intravenous Given Mechele Claude, CRNA        Mobility Walks  Report called to M. Corum, Therapist, sports. Patient transferred to floor via stretcher. VSS on transfer.  Dorrene German, RN

## 2019-07-04 NOTE — Op Note (Signed)
OPERATIVE NOTE   Heather Campos DOB: 08/26/1975  [id number]  Date of Surgery: Jul 04, 2019  Preoperative Diagnosis:  Abnormal uterine bleeding, dysmenorrhea, symptomatic fibroid uterus.  Postoperative Diagnosis:  Same as above   Procedure:  1. Diagnostic laparoscopy 2. Exploratory Laparotomy  3. Supracervical hysterectomy  4. Bilateral salpingectomy  5. Cystoscopy    Surgeon:  Mady Haagensen. Alwyn Pea, M.D.  Assistant:  Earnstine Regal, M.D.   Anesthetic:  General Endotracheal   Fluids: 2300 mL LR 250 mL Albumin UOP: 800 mL clear throughout the case  EBL: 600 mL  Catheter: Foley during case  Complications: none.  Medications: Cefotetan 2GM IV   Findings: Laparoscopy Enlarged globular uterus, adhesive disease from anterior of uterus to the anterior abdominal wall and bladder.  Normal bilateral ovaries, normal bilateral fallopian tubes.    Technique:  The patient was brought into the operating room with an intravenous line in placed, and anesthetic was administered. She was placed in a low dorsal lithotomy position using Allen stirrups. The patient's  abdomen was prepped with ChloraPrep. The perineum and vagina were prepped with multiple layers of Chlorhexadine and  the bladder catheterized with a foley.    A Time out was performed confirming the patient name and procedure.  An operative Grave's speculum was placed in the vagina and the anterior lip of the cervix was held with a single tooth tenaculum. The uterus was sounded to 9cm  and dilated with [facility] dilators. The tenaculum was removed and the anterior lip of the cervix held with a stitch of 0-vicryl. 3.5 cm Advincula uterine manipulator was placed and the intrauterine balloon inflated. The  Speculum was removed. Surgeons gloves and gown were changed and attention turned to the abdomen.   The patient was sterilely draped. The subumbilical area was injected with half percent Marcaine.  An incision was made in the umbilicus with 11  blade scalpel after infiltration with 0.5% Marcaine. A 23mm Excel Visiport trocar was used with l47mm 0 degree laparoscope attached to perform direct entry into the patient's abdomen. Direct video visualization confirmed entry into the abdomen and pneumoperitoneum was obtained using approximately 3L CO2 gas. The patient was placed in steep Trendelenburg position.There was no injury noted with placement of trocars. The pelvic contents were visualized and it was apparent that there was minimal visualization of the field due to the size of the fibroid uterus, therefore decision was made to convert to an open procedure.  Pictures were taken of the patient's pelvic structures.  The laparoscope and trochars were removed.    A Pfannenstiel incision was made with the scalpel along previous cesarean section scar and carried down to the fascia. The fascia was incised with the scalpel in a transverse manner and extended in a transverse curvilinear manner. The rectus muscles were split in the midline and a bowel free portion of the peritoneum was tented up. It was entered into with the hemostat and extended in a superior and inferior manner with good visualization of the bowel and bladder. The Alexis retractor was deployed and the bowel was packed away. The uterus tented up out of the abdominal cavity with Kelly clamps on the cornua bilaterally.   The round ligaments were suture ligated with 0-vicryl bilaterally and incised with the bovie. The bovie was then used to incise a transverse curvilinear incision in the vesico-cervical fascia. The ureters were visualized bilaterally coursing well beneath the operative field. The mesosalpinx was clamped with kelly clamp and incised using the bovie. On the  contralateral side, the mesosalpinx was transected using the Ligasure impact. The utero-ovarian ligament was then entered with the bovie, curved heaney clamp placed and incised with Mayo scissors. Each pedicle of the utero ovarian  ligament was secured with a free tie and then suture ligated with 0-vicryl. The same was performed on the contralateral side. The bladder flap was then retracted inferiorly with blunt and cautery dissection below the external cervical os.  The uterine arteries were clamped bilaterally with the heaneys and incised with the mayos. Each pedicle was then secured with a stitch of 0-vicryl. The Uterine corpus was then incised off the cervix with the bovie with the fallopian tubes attached and handed off the field to be sent to Pathology. The decision was made to do a supracervical hysterectomy due to the adhesive disease along the bladder edge.   The endocervical canal was cauterized with the bovie. The peritoneum was sutured over the cervix in running, locked fashion with 0-vicryl.  Hemostasis was insured and the ureters were peristalsing well out of the field of dissection. Irrigation was performed and hemostasis was assured. Doylene Bode was placed over the pedicles.  The instruments and laps were removed from the abdominal cavity and the peritoneum was closed with a running stitch of 0-chromic. The muscle was approximated with interrupted sutures of 2-0 chromic. The fascia was closed with looped 0-PDS in a running manner. The subcutaneous tissue was irrigated and perforating bleeders alleviated with the bovie. 50 cc of Expiril diluted with 0.5% Marcaine was injected into the subcutaneous tissue.  The umbilical fascia was closed with 2-0 vicryl on UR-6 and the skin with 3-0 Monocryl.  The skin of the Pfannenstiel incision was closed with 3-0 vicryl sub-q with a keith needle. Dermabond was placed over the incisions. Honeycomb dressing was placed.   Attention was then turned again to the vagina the foley removed, and a 70 degree cystoscopy placed into the urethra. The bladder was entered under direct visualization. Approximately 366mL of sterile water was placed into the bladder. A complete bladder survey was  performed and the urothelium noted to be normal. Both ureteral orifices visualized and brisk jets seen. The cystoscopic portion of the procedure was then ended.   Pt tolerated the procedure well and was transferred to the recovery room in stable condition.  All sponge, lap, instrument and needle counts were correct x 3.  Sanjuana Kava MD

## 2019-07-04 NOTE — H&P (Signed)
Heather Campos is an 44 y.o. female P2 presented to my office as a NEW GYN problem patient. She reports increase in abdominal pain, dysmenorrhea refractory to OTC medications. She reports having menses for 5 days with passing of clots first 3 days and severe cramps. The flow will stop after 5 days but it will return in several days and last for two weeks. This has been occurring since December. She also reports dyspareunia increase in abdominal girth and pressure. Her last pap smear was 2019.  Endometrial biopsy was negative for hyperplasia or malignancy.  Pelvic ultrasound showed uterine fibroids large anterior fundal fibroid.   Pertinent Gynecological History: Menses: flow is excessive with use of 4 pads or tampons on heaviest days and with severe dysmenorrhea Bleeding: dysfunctional uterine bleeding Contraception: none DES exposure: denies Blood transfusions: none Sexually transmitted diseases: no past history Previous GYN Procedures: c-section  Last mammogram: normal Date: 05/15/2019 Last pap: normal Date: 04/22/2018 OB History: G2, P2   Menstrual History: Menarche age: 41 Patient's last menstrual period was 06/24/2019.    Past Medical History:  Diagnosis Date  . Allergic rhinitis   . Anxiety   . Chronic back pain   . Family history of systemic lupus erythematosus   . GERD (gastroesophageal reflux disease)   . Hypothyroidism due to Hashimoto's thyroiditis   . IDA (iron deficiency anemia)   . Morbid obesity ([place])   . OSA (obstructive sleep apnea)    No CPAP  . Vitamin D deficiency     Past Surgical History:  Procedure Laterality Date  . CESAREAN SECTION    . CHOLECYSTECTOMY      Family History  Problem Relation Age of Onset  . Lupus Mother   . Kidney disease Mother   . Lupus Cousin     Social History:  reports that she has quit smoking. She has never used smokeless tobacco. She reports that she does not drink alcohol or use drugs.  Allergies: No Known  Allergies  Medications Prior to Admission  Medication Sig Dispense Refill Last Dose  . clindamycin (CLEOCIN T) 1 % lotion Apply 1 application topically daily.      Marland Kitchen doxycycline (VIBRAMYCIN) 100 MG capsule Take 100 mg by mouth daily.      . folic acid (FOLVITE) 1 MG tablet Take 1 mg by mouth daily.     Marland Kitchen omeprazole (PRILOSEC) 40 MG capsule Take 1 capsule (40 mg total) by mouth daily as needed. 90 capsule 1   . phentermine (ADIPEX-P) 37.5 MG tablet TAKE 1 TABLET BY MOUTH ONCE DAILY 30 tablet 0   . Vitamin D, Ergocalciferol, (DRISDOL) 1.25 MG (50000 UNIT) CAPS capsule Take 1 capsule (50,000 Units total) by mouth every 7 (seven) days. 4 capsule 0   . ferrous sulfate 325 (65 FE) MG tablet Take 1 tablet (325 mg total) by mouth 2 (two) times daily. 180 tablet 0   . fluticasone (FLONASE) 50 MCG/ACT nasal spray Place 2 sprays into both nostrils daily. (Patient not taking: Reported on 06/12/2019) 16 g 6 Not Taking at Unknown time  . levothyroxine (SYNTHROID) 125 MCG tablet Take 1 tablet (125 mcg total) by mouth daily. 90 tablet 0     Review of Systems  As per HPI  Last menstrual period 06/24/2019. Physical Exam from office  Vulva: no masses, atrophy, or lesions. Bladder/Urethra: no urethral discharge or mass and normal meatus and bladder non distended. Vagina no tenderness, erythema, cystocele, rectocele, abnormal vaginal discharge, or vesicle(s) or ulcers. Cervix: no discharge  or cervical motion tenderness and grossly normal and sample taken for a Pap smear. Uterus: enlarged, fibroids, and contour irregular and midline, mobile, non-tender, and no uterine prolapse; palpable fundal anterior fibroid, tender at fundus.. Adnexa/Parametria: no parametrial tenderness or mass and no adnexal tenderness or ovarian mass.  No results found for this or any previous visit (from the past 24 hour(s)).  No results found.  Assessment/Plan: Heather Campos is a 44 yo with symptomatic fibroid uterus manifested by  abnormal uterine bleeding. Normal endometrial biopsy confirmed. [place] c/w large, multiple uterine fibroids. Treatment options previously discussed at length with the patient to include: no intervention, use of OCP's, Depo Lupron, Depo Provera, Mirena IUD, myomectomy, endometrial ablation, [place] and hysterectomy.  Pt desires definitive surgical intervention. We discussed the route of hysterectomy:  TLH possible TAH. The risks of a hysterectomy were discussed with the patient to include, but not limited to bleeding possibly requiring a transfusion, infection, wound breakdown or poor healing, damage to organs in the abdomen /pelvis w/ possible need for further surgical repair (like bowel or bladder injury), need for abdominal incision to complete procedure, blood clots, PE/MI/stroke. We also discussed long term risk of post op prolapse, worsening of UI sx, possible need for [facility] or further surgery for repair.  All questions placed by patient answered. Patient voiced understanding and consents to surgery. We also discussed ovarian preservation. She was counseled about the 1/70 lifetime risk of ovarian cancer and 5-10% risk for future ovarian surgery after the hysterectomy. PT desires preservation of her ovaries.  Patient to take oral uribel for cystoscopy afterwards the day of surgery. Post operative medications prescribed in office  On call to OR for [place] / possible TAH /  bilateral salpingectomy / cystoscopy on Jul 04, 2019  Sanjuana Kava 07/04/2019, 7:35 AM

## 2019-07-04 NOTE — Discharge Instructions (Signed)
Call [facility] @ [phone] if:  You have a temperature greater than or equal to 100.4 degrees Farenheit orally You have pain that is not made better by the pain medication given and taken as directed You have excessive bleeding or problems urinating  Take Colace (Docusate Sodium/Stool Softener) 100 mg 2-3 times daily while taking narcotic pain medicine to avoid constipation or until bowel movements are regular. Take, with food, Ibuprofen 600 mg every 6 hours for 5 days then as needed for pain  You may drive after 2  weeks You may walk up steps  You may shower  You may resume a regular diet  Keep incisions clean and dry; remove your honeycomb dressing on Jul 11, 2019 Do not lift over 15 pounds for 6 weeks Avoid anything in vagina for 6 weeks (or until after your post-operative visit)

## 2019-07-05 LAB — CBC
HCT: 28.3 % — ABNORMAL LOW (ref 36.0–46.0)
Hemoglobin: 9 g/dL — ABNORMAL LOW (ref 12.0–15.0)
MCH: 26.2 pg (ref 26.0–34.0)
MCHC: 31.8 g/dL (ref 30.0–36.0)
MCV: 82.5 fL (ref 80.0–100.0)
Platelets: 221 10*3/uL (ref 150–400)
RBC: 3.43 MIL/uL — ABNORMAL LOW (ref 3.87–5.11)
RDW: 15.7 % — ABNORMAL HIGH (ref 11.5–15.5)
WBC: 12.4 10*3/uL — ABNORMAL HIGH (ref 4.0–10.5)
nRBC: 0 % (ref 0.0–0.2)

## 2019-07-05 LAB — SURGICAL PATHOLOGY

## 2019-07-05 MED ORDER — SIMETHICONE 80 MG PO CHEW
80.0000 mg | CHEWABLE_TABLET | Freq: Four times a day (QID) | ORAL | Status: DC
  Administered 2019-07-05 (×2): 80 mg via ORAL
  Filled 2019-07-05 (×3): qty 1

## 2019-07-05 MED ORDER — DIPHENHYDRAMINE HCL 25 MG PO CAPS
50.0000 mg | ORAL_CAPSULE | Freq: Four times a day (QID) | ORAL | Status: DC | PRN
  Administered 2019-07-05: 25 mg via ORAL
  Filled 2019-07-05: qty 2

## 2019-07-05 NOTE — Plan of Care (Signed)
  Problem: Education: Goal: Required Educational Video(s) Outcome: Progressing   Problem: Clinical Measurements: Goal: Ability to maintain clinical measurements within normal limits will improve Outcome: Progressing

## 2019-07-05 NOTE — Progress Notes (Signed)
1 Day Post-Op Procedure(s) (LRB): Diagnostic Laparoscopy; Ex-Lap Supracervical hysterectomy, bilateral salpingectomy, cystoscopy.  Subjective: Patient reports incision pain and gas pain.  She has been ambulating without difficulty and voiding without difficulty.    Objective: I have reviewed patient's vital signs and intake and output.  General: alert, cooperative and no distress GI: soft, appropriately tender to palpation; bowel sounds normal; no masses,  no organomegaly, incision:  Clean dry intact, honeycomb dressing in place Extremities: extremities normal, atraumatic, no cyanosis or edema and Homans sign is negative, no sign of DVT  Assessment: s/p Procedure(s) with comments: attemted TOTAL LAPAROSCOPIC HYSTERECTOMY WITH SALPINGECTOMY (Bilateral) - 3 hours ABDOMINAL  suprcervical hysterctomyWITH SALPINGECTOMY  (Bilateral) CYSTOSCOPY: stable, progressing well and tolerating diet  Plan: Advance diet Encourage ambulation Discontinue IV fluids Discharge home plan for tomorrow   LOS: 1 day    [place] 07/05/2019, 9:51 AM

## 2019-07-06 MED ORDER — SIMETHICONE 80 MG PO CHEW: 80.0000 mg | CHEWABLE_TABLET | Freq: Four times a day (QID) | ORAL | 3 refills | Status: DC | PRN

## 2019-07-06 MED ORDER — METHOCARBAMOL 500 MG PO TABS: 500.0000 mg | ORAL_TABLET | Freq: Four times a day (QID) | ORAL | 3 refills | Status: DC | PRN

## 2019-07-06 MED ORDER — OXYCODONE HCL 5 MG PO TABS: 5.0000 mg | ORAL_TABLET | Freq: Four times a day (QID) | ORAL | 0 refills | Status: AC | PRN

## 2019-07-06 MED ORDER — ZOLPIDEM TARTRATE ER 6.25 MG PO TBCR: 6.2500 mg | EXTENDED_RELEASE_TABLET | Freq: Every evening | ORAL | 0 refills | Status: DC | PRN

## 2019-07-06 NOTE — Plan of Care (Signed)
  Problem: Skin Integrity: Goal: Demonstration of wound healing without infection will improve Outcome: Progressing   Problem: Clinical Measurements: Goal: Postoperative complications will be avoided or minimized Outcome: Progressing   Problem: Clinical Measurements: Goal: Ability to maintain clinical measurements within normal limits will improve Outcome: Progressing   Problem: Education: Goal: Required Educational Video(s) Outcome: Progressing

## 2019-07-06 NOTE — Progress Notes (Signed)
2 Days Post-Op Procedure(s) (LRB): S/p Diagnostic laparoscopy, ex-lap supracervical hysterectomy bilateral salpingectomy, cystoscopy.    Subjective: Patient reports gas pain.  She is passing flatus, no BM.  She is voiding w/o difficulty.  She denies CP, SOB, fever or chills. She is ambulating, tolerating po.    Objective: I have reviewed patient's vital signs, intake and output, medications and labs.  General: alert, cooperative and no distress GI: soft, non-tender; bowel sounds normal; no masses,  no organomegaly and incision: clean, dry and intact Extremities: extremities normal, atraumatic, no cyanosis or edema and Homans sign is negative, no sign of DVT Vaginal Bleeding: none  Assessment: 44 year old AAF POD#2 s/p [facility] / bilateral salpingectomy / cystoscopy with gas pain but otherwise  Doing well.   Plan: 1. Abdominal binder for home 2. TED hose for home.  3. Discharge home with f/u incision check in office next week.    LOS: 2 days    [facility] 07/06/2019, 12:57 PM

## 2019-07-06 NOTE — Progress Notes (Signed)
Pt stable at this time with no needs. Pt given iv pain medication prior to discharge due to abdominal pain unrelieved by oral medication. Discharge instructions given to patients aunt in room as well. Pt do d/c home when pt husband arrives.

## 2019-07-06 NOTE — Plan of Care (Signed)
Pt to d/c home with family. No needs at this time.  

## 2019-07-06 NOTE — Discharge Summary (Signed)
Physician Discharge Summary  Patient ID: Heather Campos MRN: [id number] DOB/AGE: 1976-01-25 44 y.o.  Admit date: 07/04/2019 Discharge date: 07/06/2019  Admission Diagnoses:  Discharge Diagnoses:  Active Problems:   Fibroid uterus   Uterine fibroid   Discharged Condition: good  Hospital Course: Patient taken to OR at [facility] for Total laparoscopic hysterectomy.  After looking in her abdomen / pelvis with laparoscope, decision made to proceed with an open procedure due to adhesive disease after cesarean section.  Ex-lap Supracervical hysterectomy ,  Bilateral salpingectomy performed followed by a cystoscopy w/o event or complication.  Patient admitted to the med-surg floor and progressed with normal post operative course.  She ambulated, voided, and tolerated po. She was discharged on post operative day #2.     Consults: None  Significant Diagnostic Studies: labs: preop Hb:11 post op Hb 9   Treatments: IV hydration, antibiotics: Ancef, analgesia: Dilaudid and procedures: Diagnostic laparoscopy, Ex lap supracervical hysterectomy, bilateral salpingectomy, cystoscopy  Discharge Exam: Blood pressure 127/79, pulse 85, temperature 98.6 F (37 C), temperature source Oral, resp. rate 18, height 5\' 4"  (1.626 m), weight 116.1 kg, last menstrual period 06/24/2019, SpO2 97 %. General: alert, cooperative and no distress GI: soft, non-tender; bowel sounds normal; no masses,  no organomegaly and incision: clean, dry and intact Extremities: extremities normal, atraumatic, no cyanosis or edema and Homans sign is negative, no sign of DVT Vaginal Bleeding: none  Disposition: Discharge disposition: 01-Home or Self Care       Discharge Instructions    Call MD for:   Complete by: As directed    Abnormal foul smelling vaginal discharge or heavy vaginal bleeding (soaking a pad an hour)   Call MD for:  difficulty breathing, headache or visual disturbances   Complete by: As  directed    Call MD for:  hives   Complete by: As directed    Call MD for:  persistant dizziness or light-headedness   Complete by: As directed    Call MD for:  persistant nausea and vomiting   Complete by: As directed    Call MD for:  redness, tenderness, or signs of infection (pain, swelling, redness, odor or green/yellow discharge around incision site)   Complete by: As directed    Call MD for:  severe uncontrolled pain   Complete by: As directed    Call MD for:  temperature >100.4   Complete by: As directed    Diet - low sodium heart healthy   Complete by: As directed    Discharge instructions   Complete by: As directed    Call Dr. Alwyn Pea if you have any questions or concerns [phone]  please call [place] office at [phone] or if you have any non-emergent questions or concerns.   Driving Restrictions   Complete by: As directed    No driving for 2 weeks or while taking narcotic medication   Increase activity slowly   Complete by: As directed    Leave dressing on - Keep it clean, dry, and intact until clinic visit   Complete by: As directed    Lifting restrictions   Complete by: As directed    No heavy lifting, nothing heavier than a gallon a milk or approximately 20 pounds   Other Restrictions   Complete by: As directed    Showers only, no baths, tubs, or pools   Sexual Activity Restrictions   Complete by: As directed    No intercourse or anything in vagina for 4-6 weeks  or until seen at post operative visit.     Allergies as of 07/06/2019   No Known Allergies     Medication List    TAKE these medications   clindamycin 1 % lotion Commonly known as: CLEOCIN T Apply 1 application topically daily.   doxycycline 100 MG capsule Commonly known as: VIBRAMYCIN Take 100 mg by mouth daily.   ferrous sulfate 325 (65 FE) MG tablet Take 1 tablet (325 mg total) by mouth 2 (two) times daily.   fluticasone 50 MCG/ACT nasal spray Commonly known as:  FLONASE Place 2 sprays into both nostrils daily.   folic acid 1 MG tablet Commonly known as: FOLVITE Take 1 mg by mouth daily.   levothyroxine 125 MCG tablet Commonly known as: SYNTHROID Take 1 tablet (125 mcg total) by mouth daily.   methocarbamol 500 MG tablet Commonly known as: Robaxin Take 1 tablet (500 mg total) by mouth every 6 (six) hours as needed for muscle spasms.   omeprazole 40 MG capsule Commonly known as: PRILOSEC Take 1 capsule (40 mg total) by mouth daily as needed.   oxyCODONE 5 MG immediate release tablet Commonly known as: Oxy IR/ROXICODONE Take 1 tablet (5 mg total) by mouth every 6 (six) hours as needed for up to 7 days for breakthrough pain.   phentermine 37.5 MG tablet Commonly known as: ADIPEX-P TAKE 1 TABLET BY MOUTH ONCE DAILY   simethicone 80 MG chewable tablet Commonly known as: MYLICON Chew 1 tablet (80 mg total) by mouth 4 (four) times daily as needed for flatulence (gas pain).   Vitamin D (Ergocalciferol) 1.25 MG (50000 UNIT) Caps capsule Commonly known as: DRISDOL Take 1 capsule (50,000 Units total) by mouth every 7 (seven) days.   zolpidem 6.25 MG CR tablet Commonly known as: AMBIEN CR Take 1 tablet (6.25 mg total) by mouth at bedtime as needed for sleep.      Follow-up Information    Sanjuana Kava, MD On 07/11/2019.   Specialty: Obstetrics and Gynecology Why: appointment time is 8:30 a.m. Contact information: [address] [address] [phone]           Signed: Sanjuana Kava 07/06/2019, 1:08 PM

## 2019-08-22 ENCOUNTER — Telehealth: Payer: Self-pay | Admitting: Internal Medicine

## 2019-08-22 NOTE — Telephone Encounter (Signed)
1) Medication(s) Requested (by name):levothyroxine (SYNTHROID) 125 MCG tablet [[id number]]  Vitamin D, Ergocalciferol, (DRISDOL) 1.25 MG (50000 UNIT) CAPS capsule  folic acid (FOLVITE) 1 MG tablet [[id number]   2) [facility], [address]  [place],  [place] [address]   3) Special Requests:   Approved medications will be sent to the pharmacy, we will reach out if there is an issue.  Requests made after 3pm may not be addressed until the following business day!  If a patient is unsure of the name of the medication(s) please note and ask patient to call back when they are able to provide all info, do not send to responsible party until all information is available!

## 2019-08-22 NOTE — Telephone Encounter (Signed)
Too soon for Levothyroxine. #90 was prescribed on 06/13/2019.  She does not need to still be on high dose Vitamin D.  We did not prescribe her folic acid.

## 2019-08-23 NOTE — Progress Notes (Deleted)
Office Visit Note  Patient: Heather Campos             Date of Birth: [birth year]           MRN: [id number]             PCP: Nicolette Bang, DO Referring: Fenton Foy, NP Visit Date: 09/04/2019 Occupation: @GUAROCC @  Subjective:  No chief complaint on file.   History of Present Illness: Heather Campos is a 44 y.o. female ***   Activities of Daily Living:  Patient reports morning stiffness for *** {minute/hour:19697}.   Patient {ACTIONS;DENIES/REPORTS:21021675::"Denies"} nocturnal pain.  Difficulty dressing/grooming: {ACTIONS;DENIES/REPORTS:21021675::"Denies"} Difficulty climbing stairs: {ACTIONS;DENIES/REPORTS:21021675::"Denies"} Difficulty getting out of chair: {ACTIONS;DENIES/REPORTS:21021675::"Denies"} Difficulty using hands for taps, buttons, cutlery, and/or writing: {ACTIONS;DENIES/REPORTS:21021675::"Denies"}  No Rheumatology ROS completed.   PMFS History:  Patient Active Problem List   Diagnosis Date Noted  . Fibroid uterus 07/04/2019  . Uterine fibroid 07/04/2019  . Tachycardia 05/14/2019  . History of COVID-19 05/13/2019  . Hair loss 05/13/2019  . Muscle pain 05/13/2019  . OSA (obstructive sleep apnea) 02/09/2016  . Family history of systemic lupus erythematosus 02/09/2016  . Vitamin D deficiency 09/02/2015  . Morbid obesity due to excess calories ([place]) 09/02/2015  . Iron deficiency anemia due to chronic blood loss 09/02/2015  . Hypothyroidism due to Hashimoto's thyroiditis 09/02/2015    Past Medical History:  Diagnosis Date  . Allergic rhinitis   . Anxiety   . Chronic back pain   . Family history of systemic lupus erythematosus   . GERD (gastroesophageal reflux disease)   . Hypothyroidism due to Hashimoto's thyroiditis   . IDA (iron deficiency anemia)   . Morbid obesity ([place])   . OSA (obstructive sleep apnea)    No CPAP  . Vitamin D deficiency     Family History  Problem Relation Age of Onset  . Lupus Mother   . Kidney disease  Mother   . Lupus Cousin    Past Surgical History:  Procedure Laterality Date  . CESAREAN SECTION    . CHOLECYSTECTOMY    . CYSTOSCOPY  07/04/2019   Procedure: CYSTOSCOPY;  Surgeon: Sanjuana Kava, MD;  Location: [facility];  Service: Gynecology;;  . HYSTERECTOMY ABDOMINAL WITH SALPINGECTOMY Bilateral 07/04/2019   Procedure: ABDOMINAL  suprcervical hysterctomyWITH SALPINGECTOMY ;  Surgeon: Sanjuana Kava, MD;  Location: [place];  Service: Gynecology;  Laterality: Bilateral;  . TOTAL LAPAROSCOPIC HYSTERECTOMY WITH SALPINGECTOMY Bilateral 07/04/2019   Procedure: attemted TOTAL LAPAROSCOPIC HYSTERECTOMY WITH SALPINGECTOMY;  Surgeon: Sanjuana Kava, MD;  Location: [place];  Service: Gynecology;  Laterality: Bilateral;  3 hours   Social History   Social History Narrative  . Not on file   Immunization History  Administered Date(s) Administered  . Influenza,inj,Quad PF,6+ Mos 01/17/2018, 12/23/2018  . Tdap 03/14/2018     Objective: Vital Signs: There were no vitals taken for this visit.   Physical Exam   Musculoskeletal Exam: ***  CDAI Exam: CDAI Score: -- Patient Global: --; Provider Global: -- Swollen: --; Tender: -- Joint Exam 09/04/2019   No joint exam has been documented for this visit   There is currently no information documented on the homunculus. Go to the Rheumatology activity and complete the homunculus joint exam.  Investigation: No additional findings.  Imaging: No results found.  Recent Labs: Lab Results  Component Value Date   WBC 12.4 (H) 07/05/2019   HGB 9.0 (L) 07/05/2019   PLT 221 07/05/2019   NA 140 05/13/2019  K 4.0 05/13/2019   CL 105 05/13/2019   CO2 21 05/13/2019   GLUCOSE 126 (H) 05/13/2019   BUN 9 05/13/2019   CREATININE 0.97 05/13/2019   BILITOT 1.0 03/21/2019   ALKPHOS 85 03/21/2019   AST 27 03/21/2019   ALT 27 03/21/2019   PROT 8.6 (H) 03/21/2019   ALBUMIN 4.1 03/21/2019   CALCIUM 9.5  05/13/2019   GFRAA 83 05/13/2019    Speciality Comments: No specialty comments available.  Procedures:  No procedures performed Allergies: Patient has no known allergies.   Assessment / Plan:     Visit Diagnoses: No diagnosis found.  Orders: No orders of the defined types were placed in this encounter.  No orders of the defined types were placed in this encounter.   Face-to-face time spent with patient was *** minutes. Greater than 50% of time was spent in counseling and coordination of care.  Follow-Up Instructions: No follow-ups on file.   Ofilia Neas, PA-C  Note - This record has been created using Dragon software.  Chart creation errors have been sought, but may not always  have been located. Such creation errors do not reflect on  the standard of medical care.

## 2019-09-02 ENCOUNTER — Ambulatory Visit
Admission: EM | Admit: 2019-09-02 | Discharge: 2019-09-02 | Disposition: A | Discharge status: Home / Self Care | Payer: 59 | Attending: Emergency Medicine | Admitting: Emergency Medicine

## 2019-09-02 ENCOUNTER — Other Ambulatory Visit: Payer: Self-pay

## 2019-09-02 ENCOUNTER — Encounter: Payer: Self-pay | Admitting: Emergency Medicine

## 2019-09-02 DIAGNOSIS — M25512 Pain in left shoulder: Secondary | ICD-10-CM | POA: Diagnosis not present

## 2019-09-02 DIAGNOSIS — G8929 Other chronic pain: Secondary | ICD-10-CM

## 2019-09-02 MED ORDER — METHOCARBAMOL 500 MG PO TABS: 500.0000 mg | ORAL_TABLET | Freq: Three times a day (TID) | ORAL | 0 refills | Status: AC | PRN

## 2019-09-02 MED ORDER — METHYLPREDNISOLONE SODIUM SUCC 125 MG IJ SOLR
125.0000 mg | Freq: Once | INTRAMUSCULAR | Status: AC
  Administered 2019-09-02: 125 mg via INTRAMUSCULAR

## 2019-09-02 MED ORDER — DICLOFENAC SODIUM 1 % EX GEL
2.0000 g | Freq: Four times a day (QID) | CUTANEOUS | 0 refills | Status: DC
Start: 2019-09-02 — End: 2019-11-19

## 2019-09-02 NOTE — Discharge Instructions (Signed)

## 2019-09-02 NOTE — ED Triage Notes (Signed)
Pt presents to [facility] for assessment of left shoulder pain since January 2021.  Patient states the pain got significantly worse yesterday.  States she has a rheumatology appointment later this week.  Patient states she feels a pulse in it.

## 2019-09-02 NOTE — ED Provider Notes (Signed)
[facility]    CSN: [id number] Arrival date & time: 09/02/19  1530      History   Chief Complaint Chief Complaint  Patient presents with  . Shoulder Pain    HPI Heather Campos is a 44 y.o. female with history of hypothyroidism, obesity, chronic back pain presenting for persistent left shoulder pain.  States has been ongoing since January 2021, though been worse for last few days.  Denies injury, inciting event.  No distal extremity numbness, discoloration, weakness.  No neck pain, chest pain, difficulty breathing, cough.   Past Medical History:  Diagnosis Date  . Allergic rhinitis   . Anxiety   . Chronic back pain   . Family history of systemic lupus erythematosus   . GERD (gastroesophageal reflux disease)   . Hypothyroidism due to Hashimoto's thyroiditis   . IDA (iron deficiency anemia)   . Morbid obesity ([place])   . OSA (obstructive sleep apnea)    No CPAP  . Vitamin D deficiency     Patient Active Problem List   Diagnosis Date Noted  . Fibroid uterus 07/04/2019  . Uterine fibroid 07/04/2019  . Tachycardia 05/14/2019  . History of COVID-19 05/13/2019  . Hair loss 05/13/2019  . Muscle pain 05/13/2019  . OSA (obstructive sleep apnea) 02/09/2016  . Family history of systemic lupus erythematosus 02/09/2016  . Vitamin D deficiency 09/02/2015  . Morbid obesity due to excess calories ([place]) 09/02/2015  . Iron deficiency anemia due to chronic blood loss 09/02/2015  . Hypothyroidism due to Hashimoto's thyroiditis 09/02/2015    Past Surgical History:  Procedure Laterality Date  . CESAREAN SECTION    . CHOLECYSTECTOMY    . CYSTOSCOPY  07/04/2019   Procedure: CYSTOSCOPY;  Surgeon: Sanjuana Kava, MD;  Location: [facility];  Service: Gynecology;;  . HYSTERECTOMY ABDOMINAL WITH SALPINGECTOMY Bilateral 07/04/2019   Procedure: ABDOMINAL  suprcervical hysterctomyWITH SALPINGECTOMY ;  Surgeon: Sanjuana Kava, MD;  Location: [place];   Service: Gynecology;  Laterality: Bilateral;  . TOTAL LAPAROSCOPIC HYSTERECTOMY WITH SALPINGECTOMY Bilateral 07/04/2019   Procedure: attemted TOTAL LAPAROSCOPIC HYSTERECTOMY WITH SALPINGECTOMY;  Surgeon: Sanjuana Kava, MD;  Location: [place];  Service: Gynecology;  Laterality: Bilateral;  3 hours    OB History   No obstetric history on file.      Home Medications    Prior to Admission medications   Medication Sig Start Date End Date Taking? Authorizing Provider  phentermine (ADIPEX-P) 37.5 MG tablet TAKE 1 TABLET BY MOUTH ONCE DAILY 06/20/19   Nicolette Bang, DO  clindamycin (CLEOCIN T) 1 % lotion Apply 1 application topically daily.  06/03/19   [provider]  diclofenac Sodium (VOLTAREN) 1 % GEL Apply 2 g topically 4 (four) times daily. 09/02/19   Hall-Potvin, [place], PA-C  doxycycline (VIBRAMYCIN) 100 MG capsule Take 100 mg by mouth daily.  02/25/19   [provider]  ferrous sulfate 325 (65 FE) MG tablet Take 1 tablet (325 mg total) by mouth 2 (two) times daily. 06/13/19   Fulp, Cammie, MD  fluticasone (FLONASE) 50 MCG/ACT nasal spray Place 2 sprays into both nostrils daily. Patient not taking: Reported on 06/12/2019 04/04/19   Nicolette Bang, DO  folic acid (FOLVITE) 1 MG tablet Take 1 mg by mouth daily.    [provider]  levothyroxine (SYNTHROID) 125 MCG tablet Take 1 tablet (125 mcg total) by mouth daily. 06/13/19   Fulp, Cammie, MD  methocarbamol (ROBAXIN) 500 MG tablet Take 1  tablet (500 mg total) by mouth every 8 (eight) hours as needed for up to 7 days for muscle spasms. 09/02/19 09/09/19  Hall-Potvin, [place], PA-C  omeprazole (PRILOSEC) 40 MG capsule Take 1 capsule (40 mg total) by mouth daily as needed. 08/01/18   Scot Jun, FNP  simethicone (MYLICON) 80 MG chewable tablet Chew 1 tablet (80 mg total) by mouth 4 (four) times daily as needed for flatulence (gas pain). 07/06/19   Sanjuana Kava, MD  Vitamin D,  Ergocalciferol, (DRISDOL) 1.25 MG (50000 UNIT) CAPS capsule Take 1 capsule (50,000 Units total) by mouth every 7 (seven) days. 04/23/19   Nicolette Bang, DO  zolpidem (AMBIEN CR) 6.25 MG CR tablet Take 1 tablet (6.25 mg total) by mouth at bedtime as needed for sleep. 07/06/19   Sanjuana Kava, MD    Family History Family History  Problem Relation Age of Onset  . Lupus Mother   . Kidney disease Mother   . Lupus Cousin     Social History Social History   Tobacco Use  . Smoking status: Former Research scientist (life sciences)  . Smokeless tobacco: Never Used  Vaping Use  . Vaping Use: Never used  Substance Use Topics  . Alcohol use: No  . Drug use: No     Allergies   Patient has no known allergies.   Review of Systems As per HPI   Physical Exam Triage Vital Signs ED Triage Vitals  Enc Vitals Group     BP      Pulse      Resp      Temp      Temp src      SpO2      Weight      Height      Head Circumference      Peak Flow      Pain Score      Pain Loc      Pain Edu?      Excl. in [place]?    No data found.  Updated Vital Signs BP (!) 137/93 (BP Location: Right Arm)   Pulse (!) 114   Temp 98.7 F (37.1 C) (Oral)   Resp 18   LMP 06/24/2019   SpO2 97%   Visual Acuity Right Eye Distance:   Left Eye Distance:   Bilateral Distance:    Right Eye Near:   Left Eye Near:    Bilateral Near:     Physical Exam Constitutional:      General: She is not in acute distress. HENT:     Head: Normocephalic and atraumatic.     Mouth/Throat:     Mouth: Mucous membranes are moist.     Pharynx: Oropharynx is clear.  Eyes:     General: No scleral icterus.    Pupils: Pupils are equal, round, and reactive to light.  Cardiovascular:     Rate and Rhythm: Regular rhythm. Tachycardia present.     Comments: HR 103 at bedside Pulmonary:     Effort: Pulmonary effort is normal. No respiratory distress.     Breath sounds: No wheezing or rales.  Musculoskeletal:     Cervical back: Normal range  of motion and neck supple. No rigidity or tenderness.     Comments: Left shoulder without bony deformity or tenderness as compared to right.  Patient does have anterior shoulder tenderness that is diffuse: No crepitus, edema.  Passive ROM intact, active decreased second pain.  Strength intact bilaterally.  NVI  Lymphadenopathy:  Cervical: No cervical adenopathy.  Skin:    Capillary Refill: Capillary refill takes less than 2 seconds.     Coloration: Skin is not jaundiced or pale.  Neurological:     General: No focal deficit present.     Mental Status: She is alert and oriented to person, place, and time.      UC Treatments / Results  Labs (all labs ordered are listed, but only abnormal results are displayed) Labs Reviewed - No data to display  EKG   Radiology No results found.  Procedures Procedures (including critical care time)  Medications Ordered in UC Medications  methylPREDNISolone sodium succinate (SOLU-MEDROL) 125 mg/2 mL injection 125 mg (125 mg Intramuscular Given 09/02/19 1553)    Initial Impression / Assessment and Plan / UC Course  I have reviewed the triage vital signs and the nursing notes.  Pertinent labs & imaging results that were available during my care of the patient were reviewed by me and considered in my medical decision making (see chart for details).     Patient without injury or specific inciting event: Radiography deferred.  Given Solu-Medrol in office which she tolerated well.  States she has rheumatologic follow-up.  We will also provide contact information for orthopedics in the interim.  Return precautions discussed, patient verbalized understanding and is agreeable to plan. Final Clinical Impressions(s) / UC Diagnoses   Final diagnoses:  Chronic left shoulder pain     Discharge Instructions     Recommend RICE: rest, ice, compression, elevation as needed for pain.    Heat therapy (hot compress, warm wash rag, hot showers, etc.) can  help relax muscles and soothe muscle aches. Cold therapy (ice packs) can be used to help swelling both after injury and after prolonged use of areas of chronic pain/aches.  For pain: recommend 350 mg-1000 mg of Tylenol (acetaminophen) and/or 200 mg - 800 mg of Advil (ibuprofen, Motrin) every 8 hours as needed.  May alternate between the two throughout the day as they are generally safe to take together.  DO NOT exceed more than 3000 mg of Tylenol or 3200 mg of ibuprofen in a 24 hour period as this could damage your stomach, kidneys, liver, or increase your bleeding risk.    ED Prescriptions    Medication Sig Dispense Auth. Provider   methocarbamol (ROBAXIN) 500 MG tablet Take 1 tablet (500 mg total) by mouth every 8 (eight) hours as needed for up to 7 days for muscle spasms. 15 tablet Hall-Potvin, [place], PA-C   diclofenac Sodium (VOLTAREN) 1 % GEL Apply 2 g topically 4 (four) times daily. 100 g Hall-Potvin, [place], PA-C     PDMP not reviewed this encounter.   Hall-Potvin, [place], [place] 09/02/19 1555

## 2019-09-02 NOTE — ED Notes (Signed)
Patient able to ambulate independently  

## 2019-09-03 DIAGNOSIS — M25512 Pain in left shoulder: Secondary | ICD-10-CM | POA: Insufficient documentation

## 2019-09-03 DIAGNOSIS — M7532 Calcific tendinitis of left shoulder: Secondary | ICD-10-CM | POA: Insufficient documentation

## 2019-09-04 ENCOUNTER — Ambulatory Visit: Payer: 59 | Admitting: Rheumatology

## 2019-09-06 ENCOUNTER — Telehealth: Payer: Self-pay

## 2019-09-06 NOTE — Telephone Encounter (Signed)

## 2019-09-09 ENCOUNTER — Other Ambulatory Visit: Payer: Self-pay

## 2019-09-09 ENCOUNTER — Ambulatory Visit (INDEPENDENT_AMBULATORY_CARE_PROVIDER_SITE_OTHER): Payer: 59 | Admitting: Physician Assistant

## 2019-09-09 VITALS — BP 140/90 | HR 80 | Temp 98.3°F | Resp 18 | Ht 62.0 in | Wt 245.0 lb

## 2019-09-09 DIAGNOSIS — E559 Vitamin D deficiency, unspecified: Secondary | ICD-10-CM | POA: Diagnosis not present

## 2019-09-09 DIAGNOSIS — E038 Other specified hypothyroidism: Secondary | ICD-10-CM

## 2019-09-09 DIAGNOSIS — Z1159 Encounter for screening for other viral diseases: Secondary | ICD-10-CM

## 2019-09-09 DIAGNOSIS — E063 Autoimmune thyroiditis: Secondary | ICD-10-CM

## 2019-09-09 DIAGNOSIS — M79671 Pain in right foot: Secondary | ICD-10-CM

## 2019-09-09 MED ORDER — OMEPRAZOLE 40 MG PO CPDR: 40.0000 mg | DELAYED_RELEASE_CAPSULE | Freq: Every day | ORAL | 1 refills | Status: DC | PRN

## 2019-09-09 NOTE — Progress Notes (Signed)
Established Patient Office Visit  Subjective:  Patient ID: Heather Campos, female    DOB: March 24, 1975  Age: 44 y.o. MRN: [id number]  CC:  Chief Complaint  Patient presents with  . Follow-up    HPI Heather Campos presents for medication mgmt   Did not start the weight loss medication due to having hysterectomy  Jul 06, 2019. States that she does want to start it at this time.    States that she has been having muscle spasms in her left shoulder.  States that she went to ortho last week and was given an injection with some relief.  States that she follows up with them next month.   Reports that she has been having some pain on the bottom of her right foot, describes it like she can feel a small ball when she is walking.  States that it started last week.  Is wearing Crocs and states that is her everyday shoe.    Past Medical History:  Diagnosis Date  . Allergic rhinitis   . Anxiety   . Chronic back pain   . Family history of systemic lupus erythematosus   . GERD (gastroesophageal reflux disease)   . Hypothyroidism due to Hashimoto's thyroiditis   . IDA (iron deficiency anemia)   . Morbid obesity ([place])   . OSA (obstructive sleep apnea)    No CPAP  . Vitamin D deficiency     Past Surgical History:  Procedure Laterality Date  . CESAREAN SECTION    . CHOLECYSTECTOMY    . CYSTOSCOPY  07/04/2019   Procedure: CYSTOSCOPY;  Surgeon: Sanjuana Kava, MD;  Location: [facility];  Service: Gynecology;;  . HYSTERECTOMY ABDOMINAL WITH SALPINGECTOMY Bilateral 07/04/2019   Procedure: ABDOMINAL  suprcervical hysterctomyWITH SALPINGECTOMY ;  Surgeon: Sanjuana Kava, MD;  Location: [facility];  Service: Gynecology;  Laterality: Bilateral;  . TOTAL LAPAROSCOPIC HYSTERECTOMY WITH SALPINGECTOMY Bilateral 07/04/2019   Procedure: attemted TOTAL LAPAROSCOPIC HYSTERECTOMY WITH SALPINGECTOMY;  Surgeon: Sanjuana Kava, MD;  Location: [facility];  Service:  Gynecology;  Laterality: Bilateral;  3 hours    Family History  Problem Relation Age of Onset  . Lupus Mother   . Kidney disease Mother   . Lupus Cousin     Social History   Socioeconomic History  . Marital status: Married    Spouse name: Not on file  . Number of children: Not on file  . Years of education: Not on file  . Highest education level: Not on file  Occupational History  . Not on file  Tobacco Use  . Smoking status: Former Research scientist (life sciences)  . Smokeless tobacco: Never Used  Vaping Use  . Vaping Use: Never used  Substance and Sexual Activity  . Alcohol use: No  . Drug use: No  . Sexual activity: Yes    Birth control/protection: None  Other Topics Concern  . Not on file  Social History Narrative  . Not on file   Social Determinants of Health   Financial Resource Strain:   . Difficulty of Paying Living Expenses:   Food Insecurity:   . Worried About Charity fundraiser in the Last Year:   . Arboriculturist in the Last Year:   Transportation Needs:   . Film/video editor (Medical):   Marland Kitchen Lack of Transportation (Non-Medical):   Physical Activity:   . Days of Exercise per Week:   . Minutes of Exercise per Session:   Stress:   . Feeling  of Stress :   Social Connections:   . Frequency of Communication with Friends and Family:   . Frequency of Social Gatherings with Friends and Family:   . Attends Religious Services:   . Active Member of Clubs or Organizations:   . Attends Archivist Meetings:   Marland Kitchen Marital Status:   Intimate Partner Violence:   . Fear of Current or Ex-Partner:   . Emotionally Abused:   Marland Kitchen Physically Abused:   . Sexually Abused:     Outpatient Medications Prior to Visit  Medication Sig Dispense Refill  . clindamycin (CLEOCIN T) 1 % lotion Apply 1 application topically daily.     . diclofenac Sodium (VOLTAREN) 1 % GEL Apply 2 g topically 4 (four) times daily. 100 g 0  . ferrous sulfate 325 (65 FE) MG tablet Take 1 tablet (325 mg total)  by mouth 2 (two) times daily. 858 tablet 0  . folic acid (FOLVITE) 1 MG tablet Take 1 mg by mouth daily.    Marland Kitchen levothyroxine (SYNTHROID) 125 MCG tablet Take 1 tablet (125 mcg total) by mouth daily. 90 tablet 0  . methocarbamol (ROBAXIN) 500 MG tablet Take 1 tablet (500 mg total) by mouth every 8 (eight) hours as needed for up to 7 days for muscle spasms. 15 tablet 0  . phentermine (ADIPEX-P) 37.5 MG tablet TAKE 1 TABLET BY MOUTH ONCE DAILY (Patient not taking: Reported on 09/09/2019) 30 tablet 0  . Vitamin D, Ergocalciferol, (DRISDOL) 1.25 MG (50000 UNIT) CAPS capsule Take 1 capsule (50,000 Units total) by mouth every 7 (seven) days. 4 capsule 0  . zolpidem (AMBIEN CR) 6.25 MG CR tablet Take 1 tablet (6.25 mg total) by mouth at bedtime as needed for sleep. 30 tablet 0  . doxycycline (VIBRAMYCIN) 100 MG capsule Take 100 mg by mouth daily.     . fluticasone (FLONASE) 50 MCG/ACT nasal spray Place 2 sprays into both nostrils daily. (Patient not taking: Reported on 06/12/2019) 16 g 6  . omeprazole (PRILOSEC) 40 MG capsule Take 1 capsule (40 mg total) by mouth daily as needed. 90 capsule 1  . simethicone (MYLICON) 80 MG chewable tablet Chew 1 tablet (80 mg total) by mouth 4 (four) times daily as needed for flatulence (gas pain). 30 tablet 3   No facility-administered medications prior to visit.    No Known Allergies  ROS Review of Systems  Constitutional: Negative.   HENT: Negative.   Eyes: Negative.   Respiratory: Negative.   Cardiovascular: Negative.   Gastrointestinal: Negative.   Endocrine: Negative.   Genitourinary: Negative.   Musculoskeletal: Negative.   Skin: Negative.   Allergic/Immunologic: Negative.   Neurological: Negative.   Hematological: Negative.   Psychiatric/Behavioral: Negative.       Objective:    Physical Exam Vitals and nursing note reviewed.  Constitutional:      General: She is not in acute distress.    Appearance: Normal appearance. She is obese. She is not  ill-appearing.  HENT:     Head: Normocephalic and atraumatic.     Right Ear: External ear normal.     Left Ear: External ear normal.     Nose: Nose normal.     Mouth/Throat:     Mouth: Mucous membranes are moist.     Pharynx: Oropharynx is clear.  Eyes:     Extraocular Movements: Extraocular movements intact.     Conjunctiva/sclera: Conjunctivae normal.     Pupils: Pupils are equal, round, and reactive to light.  Cardiovascular:  Rate and Rhythm: Normal rate and regular rhythm.     Pulses: Normal pulses.     Heart sounds: Normal heart sounds.  Pulmonary:     Effort: Pulmonary effort is normal.     Breath sounds: Normal breath sounds.  Abdominal:     General: Abdomen is flat. Bowel sounds are normal.     Palpations: Abdomen is soft.  Musculoskeletal:     Cervical back: Normal range of motion and neck supple.     Right foot: Normal range of motion. Tenderness present. No swelling or deformity.  Feet:     Right foot:     Skin integrity: Skin integrity normal.     Left foot:     Skin integrity: Skin integrity normal.  Skin:    General: Skin is warm and dry.  Neurological:     General: No focal deficit present.     Mental Status: She is alert and oriented to person, place, and time.  Psychiatric:        Mood and Affect: Mood normal.        Behavior: Behavior normal.        Thought Content: Thought content normal.        Judgment: Judgment normal.     BP 140/90 (BP Location: Left Arm, Patient Position: Sitting, Cuff Size: Large)   Pulse 80   Temp 98.3 F (36.8 C) (Oral)   Resp 18   Ht 5\' 2"  (1.575 m)   Wt 245 lb (111.1 kg)   LMP 06/24/2019   SpO2 96%   BMI 44.81 kg/m  Wt Readings from Last 3 Encounters:  09/09/19 245 lb (111.1 kg)  07/04/19 255 lb 14.4 oz (116.1 kg)  05/20/19 255 lb 8 oz (115.9 kg)     Health Maintenance Due  Topic Date Due  . COVID-19 Vaccine (1) Never done    There are no preventive care reminders to display for this patient.  Lab  Results  Component Value Date   TSH 6.580 (H) 09/09/2019   Lab Results  Component Value Date   WBC 12.4 (H) 07/05/2019   HGB 9.0 (L) 07/05/2019   HCT 28.3 (L) 07/05/2019   MCV 82.5 07/05/2019   PLT 221 07/05/2019   Lab Results  Component Value Date   NA 140 05/13/2019   K 4.0 05/13/2019   CO2 21 05/13/2019   GLUCOSE 126 (H) 05/13/2019   BUN 9 05/13/2019   CREATININE 0.97 05/13/2019   BILITOT 1.0 03/21/2019   ALKPHOS 85 03/21/2019   AST 27 03/21/2019   ALT 27 03/21/2019   PROT 8.6 (H) 03/21/2019   ALBUMIN 4.1 03/21/2019   CALCIUM 9.5 05/13/2019   ANIONGAP 11 03/21/2019   Lab Results  Component Value Date   CHOL 170 08/01/2018   Lab Results  Component Value Date   HDL 62 08/01/2018   Lab Results  Component Value Date   LDLCALC 90 08/01/2018   Lab Results  Component Value Date   TRIG 89 08/01/2018   Lab Results  Component Value Date   CHOLHDL 2.7 08/01/2018   Lab Results  Component Value Date   HGBA1C 5.5 03/14/2018      Assessment & Plan:   Problem List Items Addressed This Visit      Endocrine   Hypothyroidism due to Hashimoto's thyroiditis   Relevant Orders   TSH (Completed)     Other   Vitamin D deficiency - Primary   Relevant Orders   Vitamin D, 25-hydroxy (Completed)  Morbid obesity due to excess calories ([place])    Other Visit Diagnoses    Need for hepatitis C screening test       Relevant Orders   Hepatitis C Antibody (Completed)   Right foot pain        1. Vitamin D deficiency Labs completed today that were placed by Dr. Juleen China.   - Vitamin D, 25-hydroxy  2. Need for hepatitis C screening test  - Hepatitis C Antibody  3. Hypothyroidism due to Hashimoto's thyroiditis  - TSH  4. Right Foot Pain Encouraged more supportive shoes, icing, over-the-counter pain reliever   I have reviewed the patient's medical history (PMH, PSH, Social History, Family History, Medications, and allergies) , and have been updated if relevant. I  spent 30 minutes reviewing chart and  face to face time with patient.     Meds ordered this encounter  Medications  . omeprazole (PRILOSEC) 40 MG capsule    Sig: Take 1 capsule (40 mg total) by mouth daily as needed.    Dispense:  90 capsule    Refill:  1    Order Specific Question:   Supervising Provider    Answer:   Elsie Stain [[id number]]    Follow-up: Return in about 4 weeks (around 10/07/2019) for with Dr. Juleen China at [place].    Loraine Grip Mayers, PA-C

## 2019-09-09 NOTE — Patient Instructions (Addendum)
I encourage you to do a trial of the weight loss medication, increase water intake, use melatonin to help with insomnia and follow a Mediterranean-style diet.    I encourage you to wear laced up tennis shoes for the next couple of weeks, use some ice on the bottom of your foot and return to clinic if this does not improve.  It is fine to use a multivitamin over-the-counter, Dr. Juleen China will call you with your lab results regarding your vitamin D levels.  Continue follow-up with surgeon for your hysterectomy concerns.  Continue follow-up with orthopedics for your shoulder concerns.  Please let us know if there is anything else we can do for you  Kennieth Rad, PA-C Physician Assistant [place] [email or web address]     Mediterranean Diet A Mediterranean diet refers to food and lifestyle choices that are based on the traditions of countries located on the [place]. This way of eating has been shown to help prevent certain conditions and improve outcomes for people who have chronic diseases, like kidney disease and heart disease. What are tips for following this plan? Lifestyle  Cook and eat meals together with your family, when possible.  Drink enough fluid to keep your urine clear or pale yellow.  Be physically active every day. This includes: ? Aerobic exercise like running or swimming. ? Leisure activities like gardening, walking, or housework.  Get 7-8 hours of sleep each night.  If recommended by your health care provider, drink red wine in moderation. This means 1 glass a day for nonpregnant women and 2 glasses a day for men. A glass of wine equals 5 oz (150 mL). Reading food labels   Check the serving size of packaged foods. For foods such as rice and pasta, the serving size refers to the amount of cooked product, not dry.  Check the total fat in packaged foods. Avoid foods that have saturated fat or trans  fats.  Check the ingredients list for added sugars, such as corn syrup. Shopping  At the grocery store, buy most of your food from the areas near the walls of the store. This includes: ? Fresh fruits and vegetables (produce). ? Grains, beans, nuts, and seeds. Some of these may be available in unpackaged forms or large amounts (in bulk). ? Fresh seafood. ? Poultry and eggs. ? Low-fat dairy products.  Buy whole ingredients instead of prepackaged foods.  Buy fresh fruits and vegetables in-season from local farmers markets.  Buy frozen fruits and vegetables in resealable bags.  If you do not have access to quality fresh seafood, buy precooked frozen shrimp or canned fish, such as tuna, salmon, or sardines.  Buy small amounts of raw or cooked vegetables, salads, or olives from the deli or salad bar at your store.  Stock your pantry so you always have certain foods on hand, such as olive oil, canned tuna, canned tomatoes, rice, pasta, and beans. Cooking  Cook foods with extra-virgin olive oil instead of using butter or other vegetable oils.  Have meat as a side dish, and have vegetables or grains as your main dish. This means having meat in small portions or adding small amounts of meat to foods like pasta or stew.  Use beans or vegetables instead of meat in common dishes like chili or lasagna.  Experiment with different cooking methods. Try roasting or broiling vegetables instead of steaming or sauteing them.  Add frozen vegetables to soups, stews, pasta, or rice.  Add nuts or seeds  for added healthy fat at each meal. You can add these to yogurt, salads, or vegetable dishes.  Marinate fish or vegetables using olive oil, lemon juice, garlic, and fresh herbs. Meal planning   Plan to eat 1 vegetarian meal one day each week. Try to work up to 2 vegetarian meals, if possible.  Eat seafood 2 or more times a week.  Have healthy snacks readily available, such as: ? Vegetable sticks  with hummus. ? [place] yogurt. ? Fruit and nut trail mix.  Eat balanced meals throughout the week. This includes: ? Fruit: 2-3 servings a day ? Vegetables: 4-5 servings a day ? Low-fat dairy: 2 servings a day ? Fish, poultry, or lean meat: 1 serving a day ? Beans and legumes: 2 or more servings a week ? Nuts and seeds: 1-2 servings a day ? Whole grains: 6-8 servings a day ? Extra-virgin olive oil: 3-4 servings a day  Limit red meat and sweets to only a few servings a month What are my food choices?  Mediterranean diet ? Recommended  Grains: Whole-grain pasta. Brown rice. Bulgar wheat. Polenta. Couscous. Whole-wheat bread. [place].  Vegetables: Artichokes. Beets. Broccoli. Cabbage. Carrots. Eggplant. Green beans. Chard. Kale. Spinach. Onions. Leeks. Peas. Squash. Tomatoes. Peppers. Radishes.  Fruits: Apples. Apricots. Avocado. Berries. Bananas. Cherries. Dates. Figs. Grapes. Lemons. Melon. Oranges. Peaches. Plums. Pomegranate.  Meats and other protein foods: Beans. Almonds. Sunflower seeds. Pine nuts. Peanuts. [place]. Salmon. Scallops. Shrimp. [place]. Tilapia. Clams. Oysters. Eggs.  Dairy: Low-fat milk. Cheese. Greek yogurt.  Beverages: Water. Red wine. Herbal tea.  Fats and oils: Extra virgin olive oil. Avocado oil. Grape seed oil.  Sweets and desserts: [place] yogurt with honey. Baked apples. Poached pears. Trail mix.  Seasoning and other foods: Basil. Cilantro. Coriander. Cumin. Mint. Parsley. Sage. Rosemary. Tarragon. Garlic. Oregano. Thyme. Pepper. Balsalmic vinegar. Tahini. Hummus. Tomato sauce. Olives. Mushrooms. ? Limit these  Grains: Prepackaged pasta or rice dishes. Prepackaged cereal with added sugar.  Vegetables: Deep fried potatoes (french fries).  Fruits: Fruit canned in syrup.  Meats and other protein foods: Beef. Pork. Lamb. Poultry with skin. Hot dogs. Bernie[place] Salines.  Dairy: Ice cream. Sour cream. Whole milk.  Beverages: Juice. Sugar-sweetened soft drinks.  Beer. Liquor and spirits.  Fats and oils: Butter. Canola oil. Vegetable oil. Beef fat (tallow). Lard.  Sweets and desserts: Cookies. Cakes. Pies. Candy.  Seasoning and other foods: Mayonnaise. Premade sauces and marinades. The items listed may not be a complete list. Talk with your dietitian about what dietary choices are right for you. Summary  The Mediterranean diet includes both food and lifestyle choices.  Eat a variety of fresh fruits and vegetables, beans, nuts, seeds, and whole grains.  Limit the amount of red meat and sweets that you eat.  Talk with your health care provider about whether it is safe for you to drink red wine in moderation. This means 1 glass a day for nonpregnant women and 2 glasses a day for men. A glass of wine equals 5 oz (150 mL). This information is not intended to replace advice given to you by your health care provider. Make sure you discuss any questions you have with your health care provider. Document Revised: 10/08/2015 Document Reviewed: 10/01/2015 Elsevier Patient Education  [place].

## 2019-09-10 ENCOUNTER — Other Ambulatory Visit: Payer: Self-pay | Admitting: Physician Assistant

## 2019-09-10 ENCOUNTER — Telehealth: Payer: Self-pay | Admitting: Internal Medicine

## 2019-09-10 DIAGNOSIS — E038 Other specified hypothyroidism: Secondary | ICD-10-CM

## 2019-09-10 DIAGNOSIS — E559 Vitamin D deficiency, unspecified: Secondary | ICD-10-CM

## 2019-09-10 LAB — VITAMIN D 25 HYDROXY (VIT D DEFICIENCY, FRACTURES): Vit D, 25-Hydroxy: 13.6 ng/mL — ABNORMAL LOW (ref 30.0–100.0)

## 2019-09-10 LAB — TSH: TSH: 6.58 u[IU]/mL — ABNORMAL HIGH (ref 0.450–4.500)

## 2019-09-10 LAB — HEPATITIS C ANTIBODY: Hep C Virus Ab: <0.1 s/co ratio (ref 0.0–0.9)

## 2019-09-10 MED ORDER — VITAMIN D (ERGOCALCIFEROL) 1.25 MG (50000 UNIT) PO CAPS: 50000.0000 [IU] | ORAL_CAPSULE | ORAL | 2 refills | Status: DC

## 2019-09-10 MED ORDER — LEVOTHYROXINE SODIUM 137 MCG PO TABS: 125.0000 ug | ORAL_TABLET | Freq: Every day | ORAL | 1 refills | Status: DC

## 2019-09-10 NOTE — Telephone Encounter (Signed)
Patient called requesting her lab results. Patient was told that it can take up to 72 hours before the nurse calls her with the results.

## 2019-09-16 ENCOUNTER — Telehealth: Payer: Self-pay | Admitting: *Deleted

## 2019-09-16 NOTE — Telephone Encounter (Signed)
-----   Message from Kennieth Rad, [place] sent at 09/10/2019  5:47 PM EDT ----- Vitamin D levels were low, resume vitamin D 50,000 units once a week, prescription sent to pharmacy.  Thyroid levels abnormal, I sent an increase in her Synthroid to her pharmacy, she will now take 137 mcg, she will need to have this lab repeated in 6 weeks.

## 2019-09-16 NOTE — Telephone Encounter (Signed)
Medical Assistant left message on patient's home and cell voicemail. Voicemail states to give a call back to [place] with [facility] at [phone].

## 2019-09-25 ENCOUNTER — Ambulatory Visit: Payer: 59 | Admitting: Rheumatology

## 2019-10-02 DIAGNOSIS — M7502 Adhesive capsulitis of left shoulder: Secondary | ICD-10-CM | POA: Insufficient documentation

## 2019-10-10 ENCOUNTER — Encounter: Payer: Self-pay | Admitting: Internal Medicine

## 2019-10-10 ENCOUNTER — Telehealth (INDEPENDENT_AMBULATORY_CARE_PROVIDER_SITE_OTHER): Payer: 59 | Admitting: Internal Medicine

## 2019-10-10 DIAGNOSIS — E559 Vitamin D deficiency, unspecified: Secondary | ICD-10-CM

## 2019-10-10 DIAGNOSIS — E063 Autoimmune thyroiditis: Secondary | ICD-10-CM | POA: Diagnosis not present

## 2019-10-10 DIAGNOSIS — E038 Other specified hypothyroidism: Secondary | ICD-10-CM

## 2019-10-10 NOTE — Progress Notes (Signed)
Virtual Visit via Telephone Note  I connected with Cicero Duck, on 10/10/2019 at 2:29 PM by telephone due to the COVID-19 pandemic and verified that I am speaking with the correct person using two identifiers.   Consent: I discussed the limitations, risks, security and privacy concerns of performing an evaluation and management service by telephone and the availability of in person appointments. I also discussed with the patient that there may be a patient responsible charge related to this service. The patient expressed understanding and agreed to proceed.   Location of Patient: Home   Location of Provider: Clinic    Persons participating in Telemedicine visit: Sheccid Leah Thornberry [facility] Dr. Juleen China   History of Present Illness: Patient has a visit to follow up on her chronic medical conditions. At last visit, Synthroid was increased to 137 mcg. No fatigue, changes in hair/nails, hot/cold intolerance, or unintended weight changes. However, she does think that she is experiencing some hair thinning.   She has been compliant with Vit D. Took 4 capsules and picked up last refill. Has 3 tablets left.    Past Medical History:  Diagnosis Date  . Allergic rhinitis   . Anxiety   . Chronic back pain   . Family history of systemic lupus erythematosus   . GERD (gastroesophageal reflux disease)   . Hypothyroidism due to Hashimoto's thyroiditis   . IDA (iron deficiency anemia)   . Morbid obesity ([place])   . OSA (obstructive sleep apnea)    No CPAP  . Vitamin D deficiency    No Known Allergies  Current Outpatient Medications on File Prior to Visit  Medication Sig Dispense Refill  . clindamycin (CLEOCIN T) 1 % lotion Apply 1 application topically daily.     . diclofenac Sodium (VOLTAREN) 1 % GEL Apply 2 g topically 4 (four) times daily. 100 g 0  . doxycycline (VIBRAMYCIN) 100 MG capsule Take 100 mg by mouth 2 (two) times daily.    . ferrous sulfate 325 (65 FE) MG tablet Take 1  tablet (325 mg total) by mouth 2 (two) times daily. 790 tablet 0  . folic acid (FOLVITE) 1 MG tablet Take 1 mg by mouth daily.    Marland Kitchen levothyroxine (SYNTHROID) 137 MCG tablet Take 1 tablet (137 mcg total) by mouth daily. 30 tablet 1  . omeprazole (PRILOSEC) 40 MG capsule Take 1 capsule (40 mg total) by mouth daily as needed. 90 capsule 1  . Vitamin D, Ergocalciferol, (DRISDOL) 1.25 MG (50000 UNIT) CAPS capsule Take 1 capsule (50,000 Units total) by mouth every 7 (seven) days. 4 capsule 2  . zolpidem (AMBIEN CR) 6.25 MG CR tablet Take 1 tablet (6.25 mg total) by mouth at bedtime as needed for sleep. 30 tablet 0   No current facility-administered medications on file prior to visit.    Observations/Objective: NAD. Speaking clearly.  Work of breathing normal.  Alert and oriented. Mood appropriate.   Assessment and Plan: 1. Hypothyroidism due to Hashimoto's thyroiditis TSH minimally elevated at 6.58. Synthroid increased. Repeat 9/1, 6 weeks after dose change for medication management.  - TSH; Future  2. Vitamin D deficiency Vit D level low with result of 13.6. Will ensure has appropriate number of capsules at pharmacy to complete 12 week course of Vit D 50,000 IU. Then will plan to repeat labs.    Follow Up Instructions: Lab visit 9/1    I discussed the assessment and treatment plan with the patient. The patient was provided an opportunity to ask questions  and all were answered. The patient agreed with the plan and demonstrated an understanding of the instructions.   The patient was advised to call back or seek an in-person evaluation if the symptoms worsen or if the condition fails to improve as anticipated.     I provided 14 minutes total of non-face-to-face time during this encounter including median intraservice time, reviewing previous notes, investigations, ordering medications, medical decision making, coordinating care and patient verbalized understanding at the end of the  visit.    Phill Myron, D.O. Primary Care at [facility]  10/10/2019, 2:29 PM

## 2019-10-14 ENCOUNTER — Other Ambulatory Visit: Payer: 59

## 2019-10-23 ENCOUNTER — Other Ambulatory Visit (INDEPENDENT_AMBULATORY_CARE_PROVIDER_SITE_OTHER): Payer: 59

## 2019-10-23 ENCOUNTER — Other Ambulatory Visit: Payer: Self-pay | Admitting: Obstetrics & Gynecology

## 2019-10-23 ENCOUNTER — Other Ambulatory Visit: Payer: Self-pay

## 2019-10-23 DIAGNOSIS — E038 Other specified hypothyroidism: Secondary | ICD-10-CM | POA: Diagnosis not present

## 2019-10-23 DIAGNOSIS — E063 Autoimmune thyroiditis: Secondary | ICD-10-CM

## 2019-10-23 DIAGNOSIS — R109 Unspecified abdominal pain: Secondary | ICD-10-CM

## 2019-10-23 NOTE — Progress Notes (Signed)
Patient here for repeat TSH. Patient did ask for Vitamin D level to be checked today. Spoke with provider who states that it is too soon. Vitamin D level would need to be rechecked after she has completed the prescription of Vitamin D supplement, KWalker, CMA.

## 2019-10-24 LAB — TSH: TSH: 0.793 u[IU]/mL (ref 0.450–4.500)

## 2019-11-01 ENCOUNTER — Ambulatory Visit
Admission: RE | Admit: 2019-11-01 | Discharge: 2019-11-01 | Disposition: A | Discharge status: Home / Self Care | Payer: 59 | Source: Ambulatory Visit | Attending: Obstetrics & Gynecology | Admitting: Obstetrics & Gynecology

## 2019-11-01 DIAGNOSIS — R109 Unspecified abdominal pain: Secondary | ICD-10-CM

## 2019-11-01 MED ORDER — IOPAMIDOL (ISOVUE-300) INJECTION 61%
100.0000 mL | Freq: Once | INTRAVENOUS | Status: AC | PRN
  Administered 2019-11-01: 100 mL via INTRAVENOUS

## 2019-11-03 ENCOUNTER — Other Ambulatory Visit: Payer: Self-pay | Admitting: Family Medicine

## 2019-11-04 ENCOUNTER — Other Ambulatory Visit: Payer: Self-pay | Admitting: Physician Assistant

## 2019-11-04 DIAGNOSIS — E063 Autoimmune thyroiditis: Secondary | ICD-10-CM

## 2019-11-04 DIAGNOSIS — E038 Other specified hypothyroidism: Secondary | ICD-10-CM

## 2019-11-04 MED ORDER — LEVOTHYROXINE SODIUM 137 MCG PO TABS: 125.0000 ug | ORAL_TABLET | Freq: Every day | ORAL | 1 refills | Status: DC

## 2019-11-05 MED ORDER — FERROUS SULFATE 325 (65 FE) MG PO TABS: 325.0000 mg | ORAL_TABLET | Freq: Two times a day (BID) | ORAL | 0 refills | Status: DC

## 2019-11-14 DIAGNOSIS — M25612 Stiffness of left shoulder, not elsewhere classified: Secondary | ICD-10-CM | POA: Insufficient documentation

## 2019-11-18 NOTE — Progress Notes (Signed)
Office Visit Note  Patient: Heather Campos             Date of Birth: 1975/11/17           MRN: [id number]             PCP: Nicolette Bang, DO Referring: Fenton Foy, NP Visit Date: 11/19/2019 Occupation: @GUAROCC @  Subjective:  Shoulder Pain (Left shoulder pain since COVID 02/2019. [facility], had MRI)   History of Present Illness: Meshelle Holness is a 44 y.o. female seen in consultation per request of Ms. Nichols.  According to patient she developed COVID-19 infection in January 2021.  In February 2021 she started having ringing in her years for which she was seen in the emergency room x2.  She was treated with oral medication and eardrops and her symptoms resolved.  She also had urinary tract infection x3 since then.  She has not had any recent urinary tract infection.  She states since February her left shoulder joint has been hurting which has been worse in the last 1 month. She was seen by Dr. Delilah Shan at [facility] last week.  She states he did x-ray and then ultrasound-guided injection to her left shoulder joint.  She also had MRI of her left shoulder joint.  She is also referred to physical therapy.  She has had only 1 session of physical therapy so far.  She states she was told that she had some calcium deposit and she has developed frozen shoulder.  She has noted minimal improvement after the cortisone injection.  The range of motion has improved some.  None of the other joints are painful.  There is no history of oral ulcers, nasal ulcers, malar rash, photosensitivity, lymphadenopathy or joint swelling.  There is positive family history of lupus in her mother.  She has had some hair loss which she relates to thyroid issues.  She is gravida 3 para 2 and miscarriage 1.  There is no history of DVTs.  Activities of Daily Living:  Patient reports morning stiffness for 30 minutes.   Patient Reports nocturnal pain.  Difficulty dressing/grooming: Reports Difficulty climbing  stairs: Denies Difficulty getting out of chair: Denies Difficulty using hands for taps, buttons, cutlery, and/or writing: Reports  Review of Systems  Constitutional: Negative for fatigue, night sweats, weight gain and weight loss.  HENT: Negative for mouth sores, trouble swallowing, trouble swallowing, mouth dryness and nose dryness.   Eyes: Negative for pain, redness, visual disturbance and dryness.  Respiratory: Negative for cough, shortness of breath and difficulty breathing.   Cardiovascular: Negative for chest pain, palpitations, hypertension, irregular heartbeat and swelling in legs/feet.  Gastrointestinal: Negative for blood in stool, constipation and diarrhea.  Endocrine: Negative for increased urination.  Genitourinary: Negative for difficulty urinating and vaginal dryness.  Musculoskeletal: Positive for arthralgias, joint pain and morning stiffness. Negative for joint swelling, myalgias, muscle weakness, muscle tenderness and myalgias.  Skin: Positive for hair loss. Negative for color change, rash, skin tightness, ulcers and sensitivity to sunlight.       Related to thyroid issues.  Allergic/Immunologic: Negative for susceptible to infections.  Neurological: Negative for dizziness, numbness, memory loss, night sweats and weakness.  Hematological: Negative for bruising/bleeding tendency and swollen glands.  Psychiatric/Behavioral: Positive for sleep disturbance. Negative for depressed mood. The patient is not nervous/anxious.     PMFS History:  Patient Active Problem List   Diagnosis Date Noted  . Fibroid uterus 07/04/2019  . Uterine fibroid 07/04/2019  . Tachycardia 05/14/2019  .  History of COVID-19 05/13/2019  . Hair loss 05/13/2019  . OSA (obstructive sleep apnea) 02/09/2016  . Family history of systemic lupus erythematosus 02/09/2016  . Vitamin D deficiency 09/02/2015  . Morbid obesity due to excess calories ([place]) 09/02/2015  . Iron deficiency anemia due to chronic  blood loss 09/02/2015  . Hypothyroidism due to Hashimoto's thyroiditis 09/02/2015    Past Medical History:  Diagnosis Date  . Allergic rhinitis   . Anxiety   . Chronic back pain   . Family history of systemic lupus erythematosus   . GERD (gastroesophageal reflux disease)   . Hypothyroidism due to Hashimoto's thyroiditis   . IDA (iron deficiency anemia)   . Morbid obesity ([place])   . OSA (obstructive sleep apnea)    No CPAP  . Vitamin D deficiency     Family History  Problem Relation Age of Onset  . Lupus Mother   . Kidney disease Mother   . Lupus Cousin    Past Surgical History:  Procedure Laterality Date  . ABDOMINAL HYSTERECTOMY    . CESAREAN SECTION    . CHOLECYSTECTOMY    . CYSTOSCOPY  07/04/2019   Procedure: CYSTOSCOPY;  Surgeon: Sanjuana Kava, MD;  Location: [facility];  Service: Gynecology;;  . HYSTERECTOMY ABDOMINAL WITH SALPINGECTOMY Bilateral 07/04/2019   Procedure: ABDOMINAL  suprcervical hysterctomyWITH SALPINGECTOMY ;  Surgeon: Sanjuana Kava, MD;  Location: [place];  Service: Gynecology;  Laterality: Bilateral;  . TOTAL LAPAROSCOPIC HYSTERECTOMY WITH SALPINGECTOMY Bilateral 07/04/2019   Procedure: attemted TOTAL LAPAROSCOPIC HYSTERECTOMY WITH SALPINGECTOMY;  Surgeon: Sanjuana Kava, MD;  Location: [place];  Service: Gynecology;  Laterality: Bilateral;  3 hours   Social History   Social History Narrative  . Not on file   Immunization History  Administered Date(s) Administered  . Influenza,inj,Quad PF,6+ Mos 01/17/2018, 12/23/2018  . PFIZER SARS-COV-2 Vaccination 06/05/2019, 06/26/2019  . Tdap 03/14/2018     Objective: Vital Signs: BP 115/79 (BP Location: Right Arm, Patient Position: Sitting, Cuff Size: Normal)   Pulse 67   Resp 14   Ht 5\' 4"  (1.626 m)   Wt 250 lb (113.4 kg)   LMP 06/24/2019   BMI 42.91 kg/m    Physical Exam Vitals and nursing note reviewed.  Constitutional:      Appearance: She is  well-developed.  HENT:     Head: Normocephalic and atraumatic.  Eyes:     Conjunctiva/sclera: Conjunctivae normal.  Cardiovascular:     Rate and Rhythm: Normal rate and regular rhythm.     Heart sounds: Normal heart sounds.  Pulmonary:     Effort: Pulmonary effort is normal.     Breath sounds: Normal breath sounds.  Abdominal:     General: Bowel sounds are normal.     Palpations: Abdomen is soft.  Musculoskeletal:     Cervical back: Normal range of motion.  Lymphadenopathy:     Cervical: No cervical adenopathy.  Skin:    General: Skin is warm and dry.     Capillary Refill: Capillary refill takes less than 2 seconds.  Neurological:     Mental Status: She is alert and oriented to person, place, and time.  Psychiatric:        Behavior: Behavior normal.      Musculoskeletal Exam: C-spine thoracic and lumbar spine were in good range of motion.  Right shoulder was in good range of motion.  Left shoulder joint had abduction and forward flexion limited to about 30 to 40 degrees.  Elbow joints, wrist joints, MCPs, PIPs and DIPs with good range of motion.  Hip joints, knee joints, ankles, MTPs and PIPs with good range of motion with no synovitis.  CDAI Exam: CDAI Score: -- Patient Global: --; Provider Global: -- Swollen: --; Tender: -- Joint Exam 11/19/2019   No joint exam has been documented for this visit   There is currently no information documented on the homunculus. Go to the Rheumatology activity and complete the homunculus joint exam.  Investigation: No additional findings.  Imaging: CT ABDOMEN PELVIS W CONTRAST  Result Date: 11/01/2019 CLINICAL DATA:  Right lower abdominal pain, history of hernia repair EXAM: CT ABDOMEN AND PELVIS WITH CONTRAST TECHNIQUE: Multidetector CT imaging of the abdomen and pelvis was performed using the standard protocol following bolus administration of intravenous contrast. CONTRAST:  160mL ISOVUE-300 IOPAMIDOL (ISOVUE-300) INJECTION 61%  COMPARISON:  None. FINDINGS: Lower chest: No acute abnormality. Hepatobiliary: Dome of liver is partially imaged. Otherwise unremarkable. Cholecystectomy. No unexpected biliary dilatation. Pancreas: Unremarkable. Spleen: Unremarkable. Adrenals/Urinary Tract: Adrenals and kidneys unremarkable. There is tethering of the anterosuperior lateral wall to the ventral abdominal wall. Mild bladder wall thickening is likely due to underdistention. Stomach/Bowel: Stomach is within normal limits. Bowel is normal in caliber. Normal appendix. Vascular/Lymphatic: No significant vascular abnormality. No enlarged lymph nodes identified. Reproductive: Supracervical hysterectomy.  No adnexal mass. Other: No ascites. Postoperative changes of the lower ventral abdominal wall. No evidence of hernia. Musculoskeletal: Mild degenerative disc disease at L5-S1. No acute osseous abnormality. IMPRESSION: No acute abnormality. Tethering of the anterosuperior bladder wall to the ventral abdominal wall likely from adhesions. This is of unclear clinical significance. Electronically Signed   By: Macy Mis M.D.   On: 11/01/2019 16:30    Recent Labs: Lab Results  Component Value Date   WBC 12.4 (H) 07/05/2019   HGB 9.0 (L) 07/05/2019   PLT 221 07/05/2019   NA 140 05/13/2019   K 4.0 05/13/2019   CL 105 05/13/2019   CO2 21 05/13/2019   GLUCOSE 126 (H) 05/13/2019   BUN 9 05/13/2019   CREATININE 0.97 05/13/2019   BILITOT 1.0 03/21/2019   ALKPHOS 85 03/21/2019   AST 27 03/21/2019   ALT 27 03/21/2019   PROT 8.6 (H) 03/21/2019   ALBUMIN 4.1 03/21/2019   CALCIUM 9.5 05/13/2019   GFRAA 83 05/13/2019    Speciality Comments: No specialty comments available.  Procedures:  No procedures performed Allergies: Patient has no known allergies.   Assessment / Plan:     Visit Diagnoses: Chronic left shoulder pain -patient had thorough evaluation by Dr. Delilah Shan at [facility].  Which included x-ray, MRI, ultrasound-guided injection to  her left shoulder joint.  She had minimal improvement so far.  She is going to physical therapy.  She has frozen shoulder and has very limited range of motion.  I have advised her to bring a copy of MRI report at the follow-up visit.  She had no synovitis on examination.  ANA negative on 05/13/19.  Patient states that she gets frequent ANA test as her mother had lupus.  To complete the work-up I will obtain some additional test although I do not see any synovitis on examination today.  There is no history of oral ulcers, nasal ulcers, malar rash, photosensitivity, Raynaud's phenomenon or lymphadenopathy.- Plan: Rheumatoid factor, Cyclic citrul peptide antibody, IgG, Uric acid  Hair loss-related to thyroid disease per patient.  Vitamin D deficiency - Vitamin D 13.6 on 09/09/19.  She is on vitamin D and followed  by her PCP.  History of COVID-19 - 02/2019.  Patient did not have any complications.  She is fully immunized against COVID-19.    Family history of systemic lupus erythematosus-according to patient her mother has lupus.  OSA (obstructive sleep apnea)  Hypothyroidism due to Hashimoto's thyroiditis  Iron deficiency anemia due to chronic blood loss -patient states that she had hysterectomy.  There is no history of any recent blood loss.  On chart review I found that her hemoglobin used to be normal which is been persistently low.  Plan: Serum protein electrophoresis with reflex, Ambulatory referral to Hematology  BMI 42.91  Orders: Orders Placed This Encounter  Procedures  . Rheumatoid factor  . Cyclic citrul peptide antibody, IgG  . Uric acid  . Serum protein electrophoresis with reflex  . Ambulatory referral to Hematology   No orders of the defined types were placed in this encounter.    Follow-Up Instructions: Return for Frozen shoulder.   Bo Merino, MD  Note - This record has been created using Editor, commissioning.  Chart creation errors have been sought, but may not  always  have been located. Such creation errors do not reflect on  the standard of medical care.

## 2019-11-19 ENCOUNTER — Encounter: Payer: Self-pay | Admitting: Rheumatology

## 2019-11-19 ENCOUNTER — Other Ambulatory Visit: Payer: Self-pay

## 2019-11-19 ENCOUNTER — Ambulatory Visit (INDEPENDENT_AMBULATORY_CARE_PROVIDER_SITE_OTHER): Payer: 59 | Admitting: Rheumatology

## 2019-11-19 ENCOUNTER — Telehealth: Payer: Self-pay | Admitting: Hematology and Oncology

## 2019-11-19 VITALS — BP 115/79 | HR 67 | Resp 14 | Ht 64.0 in | Wt 250.0 lb

## 2019-11-19 DIAGNOSIS — G4733 Obstructive sleep apnea (adult) (pediatric): Secondary | ICD-10-CM

## 2019-11-19 DIAGNOSIS — Z8616 Personal history of COVID-19: Secondary | ICD-10-CM

## 2019-11-19 DIAGNOSIS — M25512 Pain in left shoulder: Secondary | ICD-10-CM

## 2019-11-19 DIAGNOSIS — L659 Nonscarring hair loss, unspecified: Secondary | ICD-10-CM | POA: Diagnosis not present

## 2019-11-19 DIAGNOSIS — D500 Iron deficiency anemia secondary to blood loss (chronic): Secondary | ICD-10-CM

## 2019-11-19 DIAGNOSIS — G8929 Other chronic pain: Secondary | ICD-10-CM

## 2019-11-19 DIAGNOSIS — E038 Other specified hypothyroidism: Secondary | ICD-10-CM

## 2019-11-19 DIAGNOSIS — E559 Vitamin D deficiency, unspecified: Secondary | ICD-10-CM

## 2019-11-19 DIAGNOSIS — Z6841 Body Mass Index (BMI) 40.0 and over, adult: Secondary | ICD-10-CM

## 2019-11-19 DIAGNOSIS — Z8269 Family history of other diseases of the musculoskeletal system and connective tissue: Secondary | ICD-10-CM

## 2019-11-19 DIAGNOSIS — E063 Autoimmune thyroiditis: Secondary | ICD-10-CM

## 2019-11-19 NOTE — Telephone Encounter (Signed)
Received a new hem referral from Dr. Estanislado Pandy for [place]. Heather Campos has been cld and scheduled to see Dr. Lindi Adie on 10/4 at 1:15pm. Pt aware to arrive 30 minutes early.

## 2019-11-24 NOTE — Progress Notes (Incomplete)
[facility] CONSULT NOTE  Patient Care Team: Nicolette Bang, DO as PCP - General (Family Medicine)  CHIEF COMPLAINTS/PURPOSE OF CONSULTATION:  Newly diagnosed iron deficiency anemia.   HISTORY OF PRESENTING ILLNESS:  Heather Campos 44 y.o. female is here because of recent diagnosis of iron deficiency anemia. She is referred by Dr. Estanislado Pandy. She presents to the clinic today for initial evaluation.   I reviewed her records extensively and collaborated the history with the patient.  MEDICAL HISTORY:  Past Medical History:  Diagnosis Date  . Allergic rhinitis   . Anxiety   . Chronic back pain   . Family history of systemic lupus erythematosus   . GERD (gastroesophageal reflux disease)   . Hypothyroidism due to Hashimoto's thyroiditis   . IDA (iron deficiency anemia)   . Morbid obesity ([place])   . OSA (obstructive sleep apnea)    No CPAP  . Vitamin D deficiency     SURGICAL HISTORY: Past Surgical History:  Procedure Laterality Date  . ABDOMINAL HYSTERECTOMY    . CESAREAN SECTION    . CHOLECYSTECTOMY    . CYSTOSCOPY  07/04/2019   Procedure: CYSTOSCOPY;  Surgeon: Sanjuana Kava, MD;  Location: [facility];  Service: Gynecology;;  . HYSTERECTOMY ABDOMINAL WITH SALPINGECTOMY Bilateral 07/04/2019   Procedure: ABDOMINAL  suprcervical hysterctomyWITH SALPINGECTOMY ;  Surgeon: Sanjuana Kava, MD;  Location: [facility];  Service: Gynecology;  Laterality: Bilateral;  . TOTAL LAPAROSCOPIC HYSTERECTOMY WITH SALPINGECTOMY Bilateral 07/04/2019   Procedure: attemted TOTAL LAPAROSCOPIC HYSTERECTOMY WITH SALPINGECTOMY;  Surgeon: Sanjuana Kava, MD;  Location: [facility];  Service: Gynecology;  Laterality: Bilateral;  3 hours    SOCIAL HISTORY: Social History   Socioeconomic History  . Marital status: Married    Spouse name: Not on file  . Number of children: Not on file  . Years of education: Not on file  . Highest education  level: Not on file  Occupational History  . Not on file  Tobacco Use  . Smoking status: Former Research scientist (life sciences)  . Smokeless tobacco: Never Used  Vaping Use  . Vaping Use: Never used  Substance and Sexual Activity  . Alcohol use: No  . Drug use: No  . Sexual activity: Yes    Birth control/protection: None  Other Topics Concern  . Not on file  Social History Narrative  . Not on file   Social Determinants of Health   Financial Resource Strain:   . Difficulty of Paying Living Expenses: Not on file  Food Insecurity:   . Worried About Charity fundraiser in the Last Year: Not on file  . Ran Out of Food in the Last Year: Not on file  Transportation Needs:   . Lack of Transportation (Medical): Not on file  . Lack of Transportation (Non-Medical): Not on file  Physical Activity:   . Days of Exercise per Week: Not on file  . Minutes of Exercise per Session: Not on file  Stress:   . Feeling of Stress : Not on file  Social Connections:   . Frequency of Communication with Friends and Family: Not on file  . Frequency of Social Gatherings with Friends and Family: Not on file  . Attends Religious Services: Not on file  . Active Member of Clubs or Organizations: Not on file  . Attends Archivist Meetings: Not on file  . Marital Status: Not on file  Intimate Partner Violence:   . Fear of Current or Ex-Partner: Not  on file  . Emotionally Abused: Not on file  . Physically Abused: Not on file  . Sexually Abused: Not on file    FAMILY HISTORY: Family History  Problem Relation Age of Onset  . Lupus Mother   . Kidney disease Mother   . Lupus Cousin     ALLERGIES:  has No Known Allergies.  MEDICATIONS:  Current Outpatient Medications  Medication Sig Dispense Refill  . clindamycin (CLEOCIN T) 1 % lotion Apply 1 application topically daily.     Marland Kitchen doxycycline (VIBRAMYCIN) 100 MG capsule Take 100 mg by mouth 2 (two) times daily.    . ferrous sulfate 325 (65 FE) MG tablet Take 1  tablet (325 mg total) by mouth 2 (two) times daily. 176 tablet 0  . folic acid (FOLVITE) 1 MG tablet Take 1 mg by mouth daily.    Marland Kitchen HYDROcodone-acetaminophen (NORCO) 10-325 MG tablet Take 1 tablet by mouth every 8 (eight) hours as needed.    Marland Kitchen levothyroxine (SYNTHROID) 137 MCG tablet Take 1 tablet (137 mcg total) by mouth daily. 30 tablet 1  . omeprazole (PRILOSEC) 40 MG capsule Take 1 capsule (40 mg total) by mouth daily as needed. 90 capsule 1  . Vitamin D, Ergocalciferol, (DRISDOL) 1.25 MG (50000 UNIT) CAPS capsule Take 1 capsule (50,000 Units total) by mouth every 7 (seven) days. 4 capsule 2  . zolpidem (AMBIEN CR) 6.25 MG CR tablet Take 1 tablet (6.25 mg total) by mouth at bedtime as needed for sleep. 30 tablet 0   No current facility-administered medications for this visit.    REVIEW OF SYSTEMS:   Constitutional: Denies fevers, chills or abnormal night sweats Eyes: Denies blurriness of vision, double vision or watery eyes Ears, nose, mouth, throat, and face: Denies mucositis or sore throat Respiratory: Denies cough, dyspnea or wheezes Cardiovascular: Denies palpitation, chest discomfort or lower extremity swelling Gastrointestinal:  Denies nausea, heartburn or change in bowel habits Skin: Denies abnormal skin rashes Lymphatics: Denies new lymphadenopathy or easy bruising Neurological:Denies numbness, tingling or new weaknesses Behavioral/Psych: Mood is stable, no new changes  All other systems were reviewed with the patient and are negative.  PHYSICAL EXAMINATION: ECOG PERFORMANCE STATUS: {CHL ONC ECOG PS:[id number]}  There were no vitals filed for this visit. There were no vitals filed for this visit.  GENERAL:alert, no distress and comfortable SKIN: skin color, texture, turgor are normal, no rashes or significant lesions EYES: normal, conjunctiva are pink and non-injected, sclera clear OROPHARYNX:no exudate, no erythema and lips, buccal mucosa, and tongue normal  NECK:  supple, thyroid normal size, non-tender, without nodularity LYMPH:  no palpable lymphadenopathy in the cervical, axillary or inguinal LUNGS: clear to auscultation and percussion with normal breathing effort HEART: regular rate & rhythm and no murmurs and no lower extremity edema ABDOMEN:abdomen soft, non-tender and normal bowel sounds Musculoskeletal:no cyanosis of digits and no clubbing  PSYCH: alert & oriented x 3 with fluent speech NEURO: no focal motor/sensory deficits  LABORATORY DATA:  I have reviewed the data as listed Lab Results  Component Value Date   WBC 12.4 (H) 07/05/2019   HGB 9.0 (L) 07/05/2019   HCT 28.3 (L) 07/05/2019   MCV 82.5 07/05/2019   PLT 221 07/05/2019   Lab Results  Component Value Date   NA 140 05/13/2019   K 4.0 05/13/2019   CL 105 05/13/2019   CO2 21 05/13/2019    RADIOGRAPHIC STUDIES: I have personally reviewed the radiological reports and agreed with the findings in the report.  ASSESSMENT AND PLAN:  No problem-specific Assessment & Plan notes found for this encounter.   All questions were answered. The patient knows to call the clinic with any problems, questions or concerns.   Rulon Eisenmenger, MD, MPH 11/24/2019    I, Molly Dorshimer, am acting as scribe for Nicholas Lose, MD.  {Add scribe attestation statement}

## 2019-11-25 ENCOUNTER — Inpatient Hospital Stay: Payer: 59 | Attending: Hematology and Oncology | Admitting: Hematology and Oncology

## 2019-11-25 LAB — PROTEIN ELECTROPHORESIS, SERUM, WITH REFLEX
Abnormal Protein Band1: 0.7 g/dL — ABNORMAL HIGH
Albumin ELP: 3.5 g/dL — ABNORMAL LOW (ref 3.8–4.8)
Alpha 1: 0.3 g/dL (ref 0.2–0.3)
Alpha 2: 0.7 g/dL (ref 0.5–0.9)
Beta 2: 0.4 g/dL (ref 0.2–0.5)
Beta Globulin: 0.4 g/dL (ref 0.4–0.6)
Gamma Globulin: 1.4 g/dL (ref 0.8–1.7)
Total Protein: 6.8 g/dL (ref 6.1–8.1)

## 2019-11-25 LAB — IFE INTERPRETATION: Immunofix Electr Int: DETECTED

## 2019-11-25 LAB — RHEUMATOID FACTOR: Rheumatoid fact SerPl-aCnc: <14 IU/mL (ref <14)

## 2019-11-25 LAB — CYCLIC CITRUL PEPTIDE ANTIBODY, IGG: Cyclic Citrullin Peptide Ab: 16 UNITS

## 2019-11-25 LAB — URIC ACID: Uric Acid, Serum: 6.6 mg/dL (ref 2.5–7.0)

## 2019-11-27 ENCOUNTER — Other Ambulatory Visit: Payer: Self-pay | Admitting: Internal Medicine

## 2019-11-27 DIAGNOSIS — E038 Other specified hypothyroidism: Secondary | ICD-10-CM

## 2019-11-28 MED ORDER — LEVOTHYROXINE SODIUM 137 MCG PO TABS: 125.0000 ug | ORAL_TABLET | Freq: Every day | ORAL | 1 refills | Status: DC

## 2019-11-30 ENCOUNTER — Other Ambulatory Visit: Payer: Self-pay

## 2019-11-30 ENCOUNTER — Encounter (HOSPITAL_COMMUNITY): Payer: Self-pay | Admitting: *Deleted

## 2019-11-30 ENCOUNTER — Emergency Department (HOSPITAL_COMMUNITY)
Admission: EM | Admit: 2019-11-30 | Discharge: 2019-12-01 | Disposition: A | Discharge status: Home / Self Care | Payer: 59 | Attending: Emergency Medicine | Admitting: Emergency Medicine

## 2019-11-30 ENCOUNTER — Emergency Department (HOSPITAL_COMMUNITY): Payer: 59

## 2019-11-30 DIAGNOSIS — R109 Unspecified abdominal pain: Secondary | ICD-10-CM | POA: Diagnosis not present

## 2019-11-30 DIAGNOSIS — R309 Painful micturition, unspecified: Secondary | ICD-10-CM | POA: Insufficient documentation

## 2019-11-30 DIAGNOSIS — Z5321 Procedure and treatment not carried out due to patient leaving prior to being seen by health care provider: Secondary | ICD-10-CM | POA: Diagnosis not present

## 2019-11-30 DIAGNOSIS — R079 Chest pain, unspecified: Secondary | ICD-10-CM | POA: Insufficient documentation

## 2019-11-30 LAB — TROPONIN I (HIGH SENSITIVITY): Troponin I (High Sensitivity): 3 ng/L (ref <18)

## 2019-11-30 LAB — CBC
HCT: 41.4 % (ref 36.0–46.0)
Hemoglobin: 13.5 g/dL (ref 12.0–15.0)
MCH: 26.4 pg (ref 26.0–34.0)
MCHC: 32.6 g/dL (ref 30.0–36.0)
MCV: 80.9 fL (ref 80.0–100.0)
Platelets: 279 10*3/uL (ref 150–400)
RBC: 5.12 MIL/uL — ABNORMAL HIGH (ref 3.87–5.11)
RDW: 17.2 % — ABNORMAL HIGH (ref 11.5–15.5)
WBC: 10 10*3/uL (ref 4.0–10.5)
nRBC: 0 % (ref 0.0–0.2)

## 2019-11-30 LAB — BASIC METABOLIC PANEL
Anion gap: 13 (ref 5–15)
BUN: 7 mg/dL (ref 6–20)
CO2: 27 mmol/L (ref 22–32)
Calcium: 9.6 mg/dL (ref 8.9–10.3)
Chloride: 101 mmol/L (ref 98–111)
Creatinine, Ser: 0.77 mg/dL (ref 0.44–1.00)
GFR, Estimated: >60 mL/min (ref >60)
Glucose, Bld: 115 mg/dL — ABNORMAL HIGH (ref 70–99)
Potassium: 4 mmol/L (ref 3.5–5.1)
Sodium: 141 mmol/L (ref 135–145)

## 2019-11-30 LAB — I-STAT BETA HCG BLOOD, ED (NOT ORDERABLE): I-stat hCG, quantitative: <5 m[IU]/mL (ref <5)

## 2019-11-30 LAB — URINALYSIS, ROUTINE W REFLEX MICROSCOPIC
Bilirubin Urine: NEGATIVE
Glucose, UA: NEGATIVE mg/dL
Hgb urine dipstick: NEGATIVE
Ketones, ur: NEGATIVE mg/dL
Leukocytes,Ua: NEGATIVE
Nitrite: NEGATIVE
Protein, ur: NEGATIVE mg/dL
Specific Gravity, Urine: 1.011 (ref 1.005–1.030)
pH: 7 (ref 5.0–8.0)

## 2019-11-30 NOTE — ED Triage Notes (Signed)
Chest pain, abd pain and painful urination started yesterday.

## 2019-12-06 ENCOUNTER — Telehealth: Payer: Self-pay | Admitting: Internal Medicine

## 2019-12-06 ENCOUNTER — Other Ambulatory Visit (INDEPENDENT_AMBULATORY_CARE_PROVIDER_SITE_OTHER): Payer: 59

## 2019-12-06 ENCOUNTER — Other Ambulatory Visit: Payer: Self-pay

## 2019-12-06 DIAGNOSIS — R399 Unspecified symptoms and signs involving the genitourinary system: Secondary | ICD-10-CM

## 2019-12-06 DIAGNOSIS — Z23 Encounter for immunization: Secondary | ICD-10-CM

## 2019-12-06 LAB — POCT URINALYSIS DIP (CLINITEK)
Bilirubin, UA: NEGATIVE
Blood, UA: NEGATIVE
Glucose, UA: NEGATIVE mg/dL
Ketones, POC UA: NEGATIVE mg/dL
Leukocytes, UA: NEGATIVE
Nitrite, UA: NEGATIVE
POC PROTEIN,UA: NEGATIVE
Spec Grav, UA: 1.025 (ref 1.010–1.025)
Urobilinogen, UA: 0.2 E.U./dL
pH, UA: 6 (ref 5.0–8.0)

## 2019-12-06 NOTE — Progress Notes (Signed)
Patient here to leave a urine sample prior to telehealth appointment on 12/09/2019. While here patient also received her flu shot. UA sample was normal. Will need to convert visit to in person so provider can do a physical exam to evaluate L flank pain. KWalker, CMA.

## 2019-12-06 NOTE — Telephone Encounter (Signed)
Pt came in and dropped paperwork of to be filled out by pcp

## 2019-12-09 ENCOUNTER — Encounter: Payer: Self-pay | Admitting: Internal Medicine

## 2019-12-09 ENCOUNTER — Ambulatory Visit (INDEPENDENT_AMBULATORY_CARE_PROVIDER_SITE_OTHER): Payer: 59 | Admitting: Internal Medicine

## 2019-12-09 ENCOUNTER — Other Ambulatory Visit: Payer: Self-pay

## 2019-12-09 VITALS — BP 132/89 | HR 102 | Temp 97.3°F | Resp 17 | Wt 246.0 lb

## 2019-12-09 DIAGNOSIS — D500 Iron deficiency anemia secondary to blood loss (chronic): Secondary | ICD-10-CM | POA: Diagnosis not present

## 2019-12-09 DIAGNOSIS — R109 Unspecified abdominal pain: Secondary | ICD-10-CM

## 2019-12-09 DIAGNOSIS — R399 Unspecified symptoms and signs involving the genitourinary system: Secondary | ICD-10-CM

## 2019-12-09 MED ORDER — CEPHALEXIN 500 MG PO CAPS: 500.0000 mg | ORAL_CAPSULE | Freq: Two times a day (BID) | ORAL | 0 refills | Status: DC

## 2019-12-09 NOTE — Progress Notes (Signed)
  Subjective:    Heather Campos - 44 y.o. female MRN [id number]  Date of birth: 09-Aug-1975  HPI  Sade Schlueter is here for urinary concerns. Reports for two weeks she has had dysuria and sensation of incomplete voiding. Endorses urgency. No hematuria. Has suprapubic pain. No flank pain. Afebrile. Went to ER on 10/9 and waited for over 9 hours without being seen.      Health Maintenance:  There are no preventive care reminders to display for this patient.  -  reports that she has quit smoking. She has never used smokeless tobacco. - Review of Systems: Per HPI. - Past Medical History: Patient Active Problem List   Diagnosis Date Noted  . Fibroid uterus 07/04/2019  . Uterine fibroid 07/04/2019  . Tachycardia 05/14/2019  . History of COVID-19 05/13/2019  . Hair loss 05/13/2019  . OSA (obstructive sleep apnea) 02/09/2016  . Family history of systemic lupus erythematosus 02/09/2016  . Vitamin D deficiency 09/02/2015  . Morbid obesity due to excess calories ([place]) 09/02/2015  . Iron deficiency anemia due to chronic blood loss 09/02/2015  . Hypothyroidism due to Hashimoto's thyroiditis 09/02/2015   - Medications: reviewed and updated   Objective:   Physical Exam BP 132/89   Pulse (!) 102   Temp (!) 97.3 F (36.3 C) (Temporal)   Resp 17   Wt 246 lb (111.6 kg)   LMP 06/24/2019   SpO2 96%   BMI 42.23 kg/m  Physical Exam Constitutional:      General: She is not in acute distress.    Appearance: She is not diaphoretic.  Cardiovascular:     Rate and Rhythm: Normal rate.  Pulmonary:     Effort: Pulmonary effort is normal. No respiratory distress.  Abdominal:     General: There is no distension.     Palpations: Abdomen is soft. There is no mass.     Tenderness: There is no guarding or rebound.     Comments: Suprapubic TTP.   Musculoskeletal:        General: Normal range of motion.     Comments: No CVA tenderness.   Skin:    General: Skin is warm and dry.  Neurological:      Mental Status: She is alert and oriented to person, place, and time.  Psychiatric:        Mood and Affect: Affect normal.        Judgment: Judgment normal.            Assessment & Plan:   1. UTI symptoms Symptoms are concerning for acute cystitis. Not complicated infection and stable for outpatient management. Will sent for culture and treat presumptively with Keflex. Of note, UA obtained on 10/9 and 10/15 were normal. If urine culture is negative, will need to investigate other causes of her symptoms.  - cephALEXin (KEFLEX) 500 MG capsule; Take 1 capsule (500 mg total) by mouth 2 (two) times daily.  Dispense: 10 capsule; Refill: 0 - Urine Culture  2. Iron deficiency anemia due to chronic blood loss Discussed with patient that HgB obtained at triage in ED was normal with result >13. She had hysterectomy done earlier this year. Her anemia was related to chronic iron deficiency from blood loss. Discussed that now that she is no longer menstruating and HgB is stable, can d/c Fe supplements. Will monitor.       Phill Myron, D.O. 12/09/2019, 10:39 AM Primary Care at [facility]

## 2019-12-11 LAB — URINE CULTURE: Organism ID, Bacteria: NO GROWTH

## 2019-12-15 ENCOUNTER — Telehealth: Payer: Self-pay | Admitting: Rheumatology

## 2019-12-15 NOTE — Progress Notes (Signed)
Office Visit Note  Patient: Heather Campos             Date of Birth: [birth year]           MRN: [id number]             PCP: Nicolette Bang, DO Referring: Caryl Never* Visit Date: 12/25/2019 Occupation: @GUAROCC @  Subjective:  Pain of the Left Shoulder   History of Present Illness: Heather Campos is a 44 y.o. female with history of left shoulder joint pain.  She states she has severe pain and discomfort in left shoulder.  She has been seen Dr. Delilah Shan.  She had 1 cortisone injection and has been going to physical therapy without much relief.  She will be getting another cortisone injection.  He also discussed possible surgery with her.  None of the other joints are painful.  Activities of Daily Living:  Patient reports morning stiffness for 24 hours.   Patient Reports nocturnal pain.  Difficulty dressing/grooming: Reports Difficulty climbing stairs: Denies Difficulty getting out of chair: Denies Difficulty using hands for taps, buttons, cutlery, and/or writing: Reports  Review of Systems  Constitutional: Negative for fatigue.  HENT: Negative for mouth dryness and nose dryness.   Eyes: Negative for pain, itching and dryness.  Respiratory: Negative for cough, shortness of breath and difficulty breathing.   Cardiovascular: Negative for chest pain and palpitations.  Gastrointestinal: Negative for abdominal pain, constipation and diarrhea.  Endocrine: Negative for increased urination.  Genitourinary: Positive for painful urination.  Musculoskeletal: Positive for arthralgias, joint pain, myalgias, morning stiffness and myalgias. Negative for joint swelling, muscle weakness and muscle tenderness.  Skin: Negative for color change, rash and redness.  Allergic/Immunologic: Negative for susceptible to infections.  Neurological: Negative for dizziness, headaches and weakness.  Hematological: Negative for swollen glands.  Psychiatric/Behavioral: Positive for sleep  disturbance.    PMFS History:  Patient Active Problem List   Diagnosis Date Noted  . Fibroid uterus 07/04/2019  . Uterine fibroid 07/04/2019  . Tachycardia 05/14/2019  . History of COVID-19 05/13/2019  . Hair loss 05/13/2019  . OSA (obstructive sleep apnea) 02/09/2016  . Family history of systemic lupus erythematosus 02/09/2016  . Vitamin D deficiency 09/02/2015  . Morbid obesity due to excess calories ([place]) 09/02/2015  . Iron deficiency anemia due to chronic blood loss 09/02/2015  . Hypothyroidism due to Hashimoto's thyroiditis 09/02/2015    Past Medical History:  Diagnosis Date  . Allergic rhinitis   . Anxiety   . Chronic back pain   . Family history of systemic lupus erythematosus   . GERD (gastroesophageal reflux disease)   . Hypothyroidism due to Hashimoto's thyroiditis   . IDA (iron deficiency anemia)   . Morbid obesity ([place])   . OSA (obstructive sleep apnea)    No CPAP  . Vitamin D deficiency     Family History  Problem Relation Age of Onset  . Lupus Mother   . Kidney disease Mother   . Lupus Cousin    Past Surgical History:  Procedure Laterality Date  . ABDOMINAL HYSTERECTOMY    . CESAREAN SECTION    . CHOLECYSTECTOMY    . CYSTOSCOPY  07/04/2019   Procedure: CYSTOSCOPY;  Surgeon: Sanjuana Kava, MD;  Location: [facility];  Service: Gynecology;;  . HYSTERECTOMY ABDOMINAL WITH SALPINGECTOMY Bilateral 07/04/2019   Procedure: ABDOMINAL  suprcervical hysterctomyWITH SALPINGECTOMY ;  Surgeon: Sanjuana Kava, MD;  Location: [place];  Service: Gynecology;  Laterality: Bilateral;  . TOTAL  LAPAROSCOPIC HYSTERECTOMY WITH SALPINGECTOMY Bilateral 07/04/2019   Procedure: attemted TOTAL LAPAROSCOPIC HYSTERECTOMY WITH SALPINGECTOMY;  Surgeon: Sanjuana Kava, MD;  Location: [place];  Service: Gynecology;  Laterality: Bilateral;  3 hours   Social History   Social History Narrative  . Not on file   Immunization History    Administered Date(s) Administered  . Influenza,inj,Quad PF,6+ Mos 01/17/2018, 12/23/2018, 12/06/2019  . PFIZER SARS-COV-2 Vaccination 06/05/2019, 06/26/2019  . Tdap 03/14/2018     Objective: Vital Signs: BP 121/76 (BP Location: Right Arm, Patient Position: Sitting, Cuff Size: Small)   Pulse 93   Ht 5\' 4"  (1.626 m)   Wt 240 lb 3.2 oz (109 kg)   LMP 06/24/2019   BMI 41.23 kg/m    Physical Exam Vitals and nursing note reviewed.  Constitutional:      Appearance: She is well-developed.  HENT:     Head: Normocephalic and atraumatic.  Eyes:     Conjunctiva/sclera: Conjunctivae normal.  Cardiovascular:     Rate and Rhythm: Normal rate and regular rhythm.     Heart sounds: Normal heart sounds.  Pulmonary:     Effort: Pulmonary effort is normal.     Breath sounds: Normal breath sounds.  Abdominal:     General: Bowel sounds are normal.     Palpations: Abdomen is soft.  Musculoskeletal:     Cervical back: Normal range of motion.  Lymphadenopathy:     Cervical: No cervical adenopathy.  Skin:    General: Skin is warm and dry.     Capillary Refill: Capillary refill takes less than 2 seconds.  Neurological:     Mental Status: She is alert and oriented to person, place, and time.  Psychiatric:        Behavior: Behavior normal.      Musculoskeletal Exam: C-spine, thoracic and lumbar spine were in good range of motion.  Right shoulder joint was in good range of motion.  She is limited abduction up to 30 degrees of her left shoulder joint which was painful.  Elbow joints, wrist joints, MCPs PIPs and DIPs with good range of motion with no synovitis.  Hip joints, knee joints, ankles, MTPs and PIPs with good range of motion with no synovitis.  CDAI Exam: CDAI Score: -- Patient Global: --; Provider Global: -- Swollen: --; Tender: -- Joint Exam 12/25/2019   No joint exam has been documented for this visit   There is currently no information documented on the homunculus. Go to the  Rheumatology activity and complete the homunculus joint exam.  Investigation: No additional findings.  Imaging: DG Chest 2 View  Result Date: 11/30/2019 CLINICAL DATA:  Generalized abdominal pain and left chest pain. EXAM: CHEST - 2 VIEW COMPARISON:  Chest radiograph 03/21/2019 FINDINGS: The heart size and mediastinal contours are within normal limits. Both lungs are clear. The visualized skeletal structures are unremarkable. IMPRESSION: No active cardiopulmonary disease. Electronically Signed   By: Lovey Newcomer M.D.   On: 11/30/2019 16:49    Recent Labs: Lab Results  Component Value Date   WBC 10.0 11/30/2019   HGB 13.5 11/30/2019   PLT 279 11/30/2019   NA 141 11/30/2019   K 4.0 11/30/2019   CL 101 11/30/2019   CO2 27 11/30/2019   GLUCOSE 115 (H) 11/30/2019   BUN 7 11/30/2019   CREATININE 0.77 11/30/2019   BILITOT 1.0 03/21/2019   ALKPHOS 85 03/21/2019   AST 27 03/21/2019   ALT 27 03/21/2019   PROT 6.8 11/19/2019  ALBUMIN 4.1 03/21/2019   CALCIUM 9.6 11/30/2019   GFRAA 83 05/13/2019   November 19, 2019 IFE IgG lambda monoclonal protein detected, uric acid 6.6, RF negative, anti-CCP negative May 13, 2019 ANA negative  MRI of the left shoulder joint October 18, 2019 done at [facility] showed #1 glenohumeral capsular thickening along the rotator interval compatible with adhesive capsulitis.  #2 ill-defined intrasubstance intermediate signal increase with distal anterior supraspinatus tendon consistent with severe tendinosis or partial thickness tear.  #3 moderate subacromial and subdeltoid bursitis  Read by Candy Sledge, MD  Speciality Comments: No specialty comments available.  Procedures:  No procedures performed Allergies: Patient has no known allergies.   Assessment / Plan:     Visit Diagnoses: Chronic left shoulder pain - Frozen shoulder evaluated by Dr. Delilah Shan now in physical therapy.  All autoimmune work-up including ANA, RF, anti-CCP, uric acid within normal  limits.  Patient brought MRI results with her today.  MRI findings were discussed with the patient at length.  I agreed with the plan discussed by Dr. Delilah Shan.  She will follow up with Dr. Delilah Shan and continue physical therapy.  All autoimmune work-up was negative which was also explained at length.  Hair loss - Due to thyroid disease per patient.  Abnormal SPEP -we will refer her to hematology.  Vitamin D deficiency - She is on vitamin D supplement.  History of COVID-19 - January 2021.  Family history of systemic lupus erythematosus - And her mother.  Hypothyroidism due to Hashimoto's thyroiditis  Iron deficiency anemia due to chronic blood loss  OSA (obstructive sleep apnea)  BMI 40.0-44.9, adult ([place])  Orders: No orders of the defined types were placed in this encounter.  No orders of the defined types were placed in this encounter.   Follow-Up Instructions: Return in about 6 months (around 06/23/2020), or if symptoms worsen or fail to improve, for Adhesive capsulitisOf left shoulder.   Bo Merino, MD  Note - This record has been created using Editor, commissioning.  Chart creation errors have been sought, but may not always  have been located. Such creation errors do not reflect on  the standard of medical care.

## 2019-12-15 NOTE — Telephone Encounter (Signed)
Abnormal IFE noted.  Please refer patient to hematology for evaluation. Thank you, Bo Merino, MD

## 2019-12-16 ENCOUNTER — Other Ambulatory Visit: Payer: Self-pay | Admitting: *Deleted

## 2019-12-16 DIAGNOSIS — R899 Unspecified abnormal finding in specimens from other organs, systems and tissues: Secondary | ICD-10-CM

## 2019-12-16 NOTE — Telephone Encounter (Signed)
I called patient, referral placed, pending appt

## 2019-12-17 ENCOUNTER — Telehealth: Payer: Self-pay | Admitting: Hematology and Oncology

## 2019-12-17 NOTE — Telephone Encounter (Signed)
Received a new hem referral from Dr. Estanislado Pandy for abnl IFE. [name] has been cld and scheduled to see Dr. Lorenso Courier on 11/22 at [place]. Letter mailed.

## 2019-12-18 NOTE — Progress Notes (Signed)
Patient notified of results & recommendations. Expressed understanding.

## 2019-12-25 ENCOUNTER — Encounter: Payer: Self-pay | Admitting: Rheumatology

## 2019-12-25 ENCOUNTER — Ambulatory Visit (INDEPENDENT_AMBULATORY_CARE_PROVIDER_SITE_OTHER): Payer: 59 | Admitting: Rheumatology

## 2019-12-25 ENCOUNTER — Other Ambulatory Visit: Payer: Self-pay

## 2019-12-25 VITALS — BP 121/76 | HR 93 | Ht 64.0 in | Wt 240.2 lb

## 2019-12-25 DIAGNOSIS — R778 Other specified abnormalities of plasma proteins: Secondary | ICD-10-CM | POA: Diagnosis not present

## 2019-12-25 DIAGNOSIS — D500 Iron deficiency anemia secondary to blood loss (chronic): Secondary | ICD-10-CM

## 2019-12-25 DIAGNOSIS — E559 Vitamin D deficiency, unspecified: Secondary | ICD-10-CM

## 2019-12-25 DIAGNOSIS — L659 Nonscarring hair loss, unspecified: Secondary | ICD-10-CM | POA: Diagnosis not present

## 2019-12-25 DIAGNOSIS — G4733 Obstructive sleep apnea (adult) (pediatric): Secondary | ICD-10-CM

## 2019-12-25 DIAGNOSIS — E038 Other specified hypothyroidism: Secondary | ICD-10-CM

## 2019-12-25 DIAGNOSIS — Z8269 Family history of other diseases of the musculoskeletal system and connective tissue: Secondary | ICD-10-CM

## 2019-12-25 DIAGNOSIS — Z6841 Body Mass Index (BMI) 40.0 and over, adult: Secondary | ICD-10-CM

## 2019-12-25 DIAGNOSIS — M25512 Pain in left shoulder: Secondary | ICD-10-CM

## 2019-12-25 DIAGNOSIS — G8929 Other chronic pain: Secondary | ICD-10-CM

## 2019-12-25 DIAGNOSIS — E063 Autoimmune thyroiditis: Secondary | ICD-10-CM

## 2019-12-25 DIAGNOSIS — Z8616 Personal history of COVID-19: Secondary | ICD-10-CM

## 2019-12-26 ENCOUNTER — Ambulatory Visit (INDEPENDENT_AMBULATORY_CARE_PROVIDER_SITE_OTHER): Payer: 59 | Admitting: Physician Assistant

## 2019-12-26 VITALS — BP 126/86 | HR 84 | Temp 97.6°F | Resp 18 | Ht 64.0 in | Wt 244.0 lb

## 2019-12-26 DIAGNOSIS — R1032 Left lower quadrant pain: Secondary | ICD-10-CM

## 2019-12-26 DIAGNOSIS — R3989 Other symptoms and signs involving the genitourinary system: Secondary | ICD-10-CM | POA: Diagnosis not present

## 2019-12-26 NOTE — Progress Notes (Signed)
Patient presents with Left side abdominal pain beginning 3 weeks ago. Patient describes pain as throbbing. Patient reports noticing a green tint to her urine intermittently over the past 2 days. Patient has taken medication today and patient has eaten today.

## 2019-12-26 NOTE — Patient Instructions (Addendum)
Continue to drink plenty of fluids and you can try Tylenol or ibuprofen to help relieve discomfort.    We will call you with your lab results.  I hope that you feel better soon  Kennieth Rad, PA-C Physician Assistant [email or web address]    Adhesions  Adhesions are stringy (fibrous) bands of tissue that stick together. They form between two surfaces of the body. Adhesions are similar to scars, but they form inside your body instead of on your skin. Adhesions can be painful and may cause problems if they pull tissues or organs out of their normal positions. They can also cause problems if they block the normal passage of substances in the body. For example, an adhesion that forms in the intestines can block the passage of food. What are the causes? This condition is caused by inflammation. Adhesions most often develop after surgery, but they can also develop after an infection, radiation treatment, or another event that causes inflammation in the body. What increases the risk? This condition is more likely to develop in:  People who have had open surgery in the abdomen.  Women who have had more than one cesarean section. What are the signs or symptoms? Symptoms of this condition vary depending on where the adhesions form. They may take weeks, months, or years to develop. Symptoms include:  Pain in the abdomen or pelvis. This is the most common symptom.  Bloating.  Constipation.  Diarrhea.  Vomiting.  Pain when having sex.  Difficulty getting pregnant. Often, there are no symptoms. How is this diagnosed? This condition is diagnosed based on:  Your symptoms.  Your medical history.  A physical exam.  Tests, such as an X-ray or CT scan.  A surgical procedure that uses a scope to check for adhesions inside your body. How is this treated? Treatment for this condition depends on where the adhesions are  located and the symptoms they are causing. If there are no symptoms, treatment may not be needed. If there are symptoms, treatment may include:  Taking medicines to help with symptoms.  Having surgery to remove the adhesions. Follow these instructions at home:  Take over-the-counter and prescription medicines only as told by your health care provider.  If you were prescribed an antibiotic medicine, take it as told by your health care provider. Donot stop using the antibiotic even if you start to feel better.  Follow any diet instructions your health care provider gives you. Your health care provider may recommend a liquid or low-fiber diet in specific cases.  Keep all follow-up visits as told by your health care provider. This is important. Contact a health care provider if you have:  A fever.  Pain in the abdomen or pelvis.  Vomiting or bloating.  Swelling in the abdomen.  Loud bowel sounds.  Difficulty passing stool (constipation). Get help right away if you:  Have severe pain in your abdomen or pelvis, and the pain is getting worse.  Have vomiting that will not go away.  Cannot pass gas.  Cannot have a bowel movement. Summary  Adhesions are stringy (fibrous) bands of tissue that stick together.  Adhesions can cause problems if they pull tissues or organs out of their normal positions.  Symptoms include pain, bloating, vomiting, constipation, diarrhea, pain during sex, and difficulty getting pregnant.  Treatment for this condition includes medicines and surgery.  Follow instructions from your health care provider about what medicines to take, what to eat and drink, when to  schedule a follow-up visit, and when to call for help. This information is not intended to replace advice given to you by your health care provider. Make sure you discuss any questions you have with your health care provider. Document Revised: 06/28/2017 Document Reviewed: 06/28/2017 Elsevier  Patient Education  [place].

## 2019-12-26 NOTE — Progress Notes (Signed)
Established Patient Office Visit  Subjective:  Patient ID: Heather Campos, female    DOB: Dec 24, 1975  Age: 43 y.o. MRN: [id number]  CC:  Chief Complaint  Patient presents with  . Abdominal Pain    left side    HPI Heather Campos reports that she has been having left lower quadrant abdominal pain and discomfort for the last 3 weeks.  Reports that she was treated with antibiotics, states that she finished the course and had a small amount of relief but then discomfort returned.  Reports that she has been having some light green discoloration to her urine for the last 2 days.  Reports that she has been drinking lots of water.  Past Medical History:  Diagnosis Date  . Allergic rhinitis   . Anxiety   . Chronic back pain   . Family history of systemic lupus erythematosus   . GERD (gastroesophageal reflux disease)   . Hypothyroidism due to Hashimoto's thyroiditis   . IDA (iron deficiency anemia)   . Morbid obesity ([place])   . OSA (obstructive sleep apnea)    No CPAP  . Vitamin D deficiency     Past Surgical History:  Procedure Laterality Date  . ABDOMINAL HYSTERECTOMY    . CESAREAN SECTION    . CHOLECYSTECTOMY    . CYSTOSCOPY  07/04/2019   Procedure: CYSTOSCOPY;  Surgeon: Sanjuana Kava, MD;  Location: [facility];  Service: Gynecology;;  . HYSTERECTOMY ABDOMINAL WITH SALPINGECTOMY Bilateral 07/04/2019   Procedure: ABDOMINAL  suprcervical hysterctomyWITH SALPINGECTOMY ;  Surgeon: Sanjuana Kava, MD;  Location: [place];  Service: Gynecology;  Laterality: Bilateral;  . TOTAL LAPAROSCOPIC HYSTERECTOMY WITH SALPINGECTOMY Bilateral 07/04/2019   Procedure: attemted TOTAL LAPAROSCOPIC HYSTERECTOMY WITH SALPINGECTOMY;  Surgeon: Sanjuana Kava, MD;  Location: [place];  Service: Gynecology;  Laterality: Bilateral;  3 hours    Family History  Problem Relation Age of Onset  . Lupus Mother   . Kidney disease Mother   . Lupus Cousin     Social  History   Socioeconomic History  . Marital status: Married    Spouse name: Not on file  . Number of children: Not on file  . Years of education: Not on file  . Highest education level: Not on file  Occupational History  . Not on file  Tobacco Use  . Smoking status: Former Research scientist (life sciences)  . Smokeless tobacco: Never Used  Vaping Use  . Vaping Use: Never used  Substance and Sexual Activity  . Alcohol use: No  . Drug use: No  . Sexual activity: Yes    Birth control/protection: None  Other Topics Concern  . Not on file  Social History Narrative  . Not on file   Social Determinants of Health   Financial Resource Strain:   . Difficulty of Paying Living Expenses: Not on file  Food Insecurity:   . Worried About Charity fundraiser in the Last Year: Not on file  . Ran Out of Food in the Last Year: Not on file  Transportation Needs:   . Lack of Transportation (Medical): Not on file  . Lack of Transportation (Non-Medical): Not on file  Physical Activity:   . Days of Exercise per Week: Not on file  . Minutes of Exercise per Session: Not on file  Stress:   . Feeling of Stress : Not on file  Social Connections:   . Frequency of Communication with Friends and Family: Not on file  . Frequency of  Social Gatherings with Friends and Family: Not on file  . Attends Religious Services: Not on file  . Active Member of Clubs or Organizations: Not on file  . Attends Archivist Meetings: Not on file  . Marital Status: Not on file  Intimate Partner Violence:   . Fear of Current or Ex-Partner: Not on file  . Emotionally Abused: Not on file  . Physically Abused: Not on file  . Sexually Abused: Not on file    Outpatient Medications Prior to Visit  Medication Sig Dispense Refill  . clindamycin (CLEOCIN T) 1 % lotion Apply 1 application topically daily.     Marland Kitchen doxycycline (ADOXA) 100 MG tablet Take 100 mg by mouth daily.    . folic acid (FOLVITE) 1 MG tablet Take 1 mg by mouth daily.    Marland Kitchen  gabapentin (NEURONTIN) 300 MG capsule Take 300 mg by mouth daily.    Marland Kitchen HYDROcodone-acetaminophen (NORCO) 10-325 MG tablet Take 1 tablet by mouth every 8 (eight) hours as needed.    Marland Kitchen levothyroxine (SYNTHROID) 137 MCG tablet Take 1 tablet (137 mcg total) by mouth daily. 30 tablet 1  . omeprazole (PRILOSEC) 40 MG capsule Take 1 capsule (40 mg total) by mouth daily as needed. 90 capsule 1  . Vitamin D, Ergocalciferol, (DRISDOL) 1.25 MG (50000 UNIT) CAPS capsule Take 1 capsule (50,000 Units total) by mouth every 7 (seven) days. 4 capsule 2  . zolpidem (AMBIEN CR) 6.25 MG CR tablet Take 1 tablet (6.25 mg total) by mouth at bedtime as needed for sleep. 30 tablet 0   No facility-administered medications prior to visit.    No Known Allergies  ROS Review of Systems  Constitutional: Negative for chills, fatigue and fever.  HENT: Negative.   Eyes: Negative.   Respiratory: Negative.   Cardiovascular: Negative.   Gastrointestinal: Positive for abdominal pain. Negative for constipation, diarrhea, nausea and vomiting.  Endocrine: Negative.   Genitourinary: Negative for dysuria, frequency, urgency and vaginal discharge.  Allergic/Immunologic: Negative.   Neurological: Negative.   Hematological: Negative.   Psychiatric/Behavioral: Negative.       Objective:    Physical Exam Vitals and nursing note reviewed.  Constitutional:      General: She is not in acute distress.    Appearance: Normal appearance. She is not ill-appearing.  HENT:     Head: Atraumatic.     Right Ear: External ear normal.     Left Ear: External ear normal.     Nose: Nose normal.     Mouth/Throat:     Mouth: Mucous membranes are moist.  Eyes:     Extraocular Movements: Extraocular movements intact.     Conjunctiva/sclera: Conjunctivae normal.     Pupils: Pupils are equal, round, and reactive to light.  Cardiovascular:     Rate and Rhythm: Normal rate and regular rhythm.     Heart sounds: Normal heart sounds.   Pulmonary:     Effort: Pulmonary effort is normal.     Breath sounds: Normal breath sounds.  Abdominal:     General: Abdomen is flat. Bowel sounds are normal.     Palpations: Abdomen is soft.     Tenderness: There is abdominal tenderness in the left lower quadrant. There is no right CVA tenderness or left CVA tenderness.  Musculoskeletal:        General: Normal range of motion.     Cervical back: Normal range of motion and neck supple.  Skin:    General: Skin is warm  and dry.  Neurological:     General: No focal deficit present.     Mental Status: She is alert and oriented to person, place, and time.  Psychiatric:        Mood and Affect: Mood normal.        Behavior: Behavior normal.        Thought Content: Thought content normal.        Judgment: Judgment normal.     BP 126/86 (BP Location: Right Arm, Patient Position: Sitting, Cuff Size: Large)   Pulse 84   Temp 97.6 F (36.4 C) (Oral)   Resp 18   Ht 5\' 4"  (1.626 m)   Wt 244 lb (110.7 kg)   LMP 06/24/2019   SpO2 98%   BMI 41.88 kg/m  Wt Readings from Last 3 Encounters:  12/26/19 244 lb (110.7 kg)  12/25/19 240 lb 3.2 oz (109 kg)  12/09/19 246 lb (111.6 kg)     There are no preventive care reminders to display for this patient.  There are no preventive care reminders to display for this patient.  Lab Results  Component Value Date   TSH 0.793 10/23/2019   Lab Results  Component Value Date   WBC 10.0 11/30/2019   HGB 13.5 11/30/2019   HCT 41.4 11/30/2019   MCV 80.9 11/30/2019   PLT 279 11/30/2019   Lab Results  Component Value Date   NA 141 11/30/2019   K 4.0 11/30/2019   CO2 27 11/30/2019   GLUCOSE 115 (H) 11/30/2019   BUN 7 11/30/2019   CREATININE 0.77 11/30/2019   BILITOT 1.0 03/21/2019   ALKPHOS 85 03/21/2019   AST 27 03/21/2019   ALT 27 03/21/2019   PROT 6.8 11/19/2019   ALBUMIN 4.1 03/21/2019   CALCIUM 9.6 11/30/2019   ANIONGAP 13 11/30/2019   Lab Results  Component Value Date   CHOL  170 08/01/2018   Lab Results  Component Value Date   HDL 62 08/01/2018   Lab Results  Component Value Date   LDLCALC 90 08/01/2018   Lab Results  Component Value Date   TRIG 89 08/01/2018   Lab Results  Component Value Date   CHOLHDL 2.7 08/01/2018   Lab Results  Component Value Date   HGBA1C 5.5 03/14/2018      Assessment & Plan:   Problem List Items Addressed This Visit    None    Visit Diagnoses    LLQ abdominal pain    -  Primary   Relevant Orders   Renal Function Panel   Urine discoloration       Relevant Orders   Urine Culture   Renal Function Panel    1. LLQ abdominal pain Patient with previous C-section, abdominal hysterectomy completed May 2021.  Patient CT of abdomen completed November 01, 2019 for right lower quadrant abdominal pain which has since resolved   IMPRESSION: No acute abnormality.  Tethering of the anterosuperior bladder wall to the ventral abdominal wall likely from adhesions. This is of unclear clinical significance.   Electronically Signed   By: Macy Mis M.D.   On: 11/01/2019 16:30  Previous UA and urine culture within normal limits, these were completed when her urinary symptoms first started.  Today's UA within normal limits, patient denies any other UTI symptoms consider surgical adhesions.  Will repeat urine culture, renal panel.  Patient education given, trial over-the-counter pain relievers, continue increase water intake   - Renal Function Panel  2. Urine discoloration  -  Urine Culture - Renal Function Panel   No orders of the defined types were placed in this encounter.   I have reviewed the patient's medical history (PMH, PSH, Social History, Family History, Medications, and allergies) , and have been updated if relevant. I spent 20 minutes reviewing chart and  face to face time with patient.     Follow-up: Return if symptoms worsen or fail to improve.    Loraine Grip Mayers, PA-C

## 2019-12-27 LAB — RENAL FUNCTION PANEL
Albumin: 4.4 g/dL (ref 3.8–4.8)
BUN/Creatinine Ratio: 11 (ref 9–23)
BUN: 8 mg/dL (ref 6–24)
CO2: 24 mmol/L (ref 20–29)
Calcium: 9.5 mg/dL (ref 8.7–10.2)
Chloride: 100 mmol/L (ref 96–106)
Creatinine, Ser: 0.73 mg/dL (ref 0.57–1.00)
GFR calc Af Amer: 117 mL/min/{1.73_m2} (ref >59)
GFR calc non Af Amer: 102 mL/min/{1.73_m2} (ref >59)
Glucose: 95 mg/dL (ref 65–99)
Phosphorus: 4.4 mg/dL — ABNORMAL HIGH (ref 3.0–4.3)
Potassium: 4.6 mmol/L (ref 3.5–5.2)
Sodium: 138 mmol/L (ref 134–144)

## 2019-12-28 LAB — URINE CULTURE

## 2019-12-30 ENCOUNTER — Telehealth: Payer: Self-pay | Admitting: *Deleted

## 2019-12-30 NOTE — Telephone Encounter (Signed)
-----   Message from Kennieth Rad, [place] sent at 12/30/2019  8:50 AM EST ----- Please call patient and let her know that her urine culture was negative and her kidney function was WNL.

## 2019-12-30 NOTE — Telephone Encounter (Signed)
Patient verified DOB Patient is aware of urine culture showing no signs of infection or growth, Patient is also aware of kidney function being normal. MA reiterated to FU with GI for colonoscopy and follow recommendations from last visit.

## 2020-01-13 ENCOUNTER — Other Ambulatory Visit: Payer: Self-pay

## 2020-01-13 ENCOUNTER — Inpatient Hospital Stay: Payer: 59 | Attending: Hematology and Oncology | Admitting: Hematology and Oncology

## 2020-01-13 ENCOUNTER — Inpatient Hospital Stay: Payer: 59

## 2020-01-13 ENCOUNTER — Encounter: Payer: Self-pay | Admitting: Hematology and Oncology

## 2020-01-13 VITALS — BP 131/90 | HR 77 | Temp 97.6°F | Resp 16 | Ht 64.0 in | Wt 252.1 lb

## 2020-01-13 DIAGNOSIS — Z79899 Other long term (current) drug therapy: Secondary | ICD-10-CM | POA: Diagnosis not present

## 2020-01-13 DIAGNOSIS — Z90710 Acquired absence of both cervix and uterus: Secondary | ICD-10-CM | POA: Insufficient documentation

## 2020-01-13 DIAGNOSIS — E559 Vitamin D deficiency, unspecified: Secondary | ICD-10-CM | POA: Diagnosis not present

## 2020-01-13 DIAGNOSIS — D500 Iron deficiency anemia secondary to blood loss (chronic): Secondary | ICD-10-CM

## 2020-01-13 DIAGNOSIS — Z87891 Personal history of nicotine dependence: Secondary | ICD-10-CM | POA: Insufficient documentation

## 2020-01-13 DIAGNOSIS — D509 Iron deficiency anemia, unspecified: Secondary | ICD-10-CM | POA: Diagnosis not present

## 2020-01-13 DIAGNOSIS — Z9049 Acquired absence of other specified parts of digestive tract: Secondary | ICD-10-CM | POA: Diagnosis not present

## 2020-01-13 DIAGNOSIS — D472 Monoclonal gammopathy: Secondary | ICD-10-CM | POA: Diagnosis not present

## 2020-01-13 DIAGNOSIS — K219 Gastro-esophageal reflux disease without esophagitis: Secondary | ICD-10-CM | POA: Insufficient documentation

## 2020-01-13 DIAGNOSIS — Z9079 Acquired absence of other genital organ(s): Secondary | ICD-10-CM | POA: Insufficient documentation

## 2020-01-13 DIAGNOSIS — E063 Autoimmune thyroiditis: Secondary | ICD-10-CM | POA: Insufficient documentation

## 2020-01-13 DIAGNOSIS — G4733 Obstructive sleep apnea (adult) (pediatric): Secondary | ICD-10-CM | POA: Diagnosis not present

## 2020-01-13 LAB — LACTATE DEHYDROGENASE: LDH: 164 U/L (ref 98–192)

## 2020-01-13 LAB — CMP (CANCER CENTER ONLY)
ALT: 15 U/L (ref 0–44)
AST: 13 U/L — ABNORMAL LOW (ref 15–41)
Albumin: 3.5 g/dL (ref 3.5–5.0)
Alkaline Phosphatase: 82 U/L (ref 38–126)
Anion gap: 7 (ref 5–15)
BUN: 8 mg/dL (ref 6–20)
CO2: 24 mmol/L (ref 22–32)
Calcium: 8.8 mg/dL — ABNORMAL LOW (ref 8.9–10.3)
Chloride: 106 mmol/L (ref 98–111)
Creatinine: 0.83 mg/dL (ref 0.44–1.00)
GFR, Estimated: >60 mL/min (ref >60)
Glucose, Bld: 91 mg/dL (ref 70–99)
Potassium: 3.8 mmol/L (ref 3.5–5.1)
Sodium: 137 mmol/L (ref 135–145)
Total Bilirubin: 0.7 mg/dL (ref 0.3–1.2)
Total Protein: 7.4 g/dL (ref 6.5–8.1)

## 2020-01-13 LAB — IRON AND TIBC
Iron: 83 ug/dL (ref 41–142)
Saturation Ratios: 23 % (ref 21–57)
TIBC: 361 ug/dL (ref 236–444)
UIBC: 278 ug/dL (ref 120–384)

## 2020-01-13 LAB — CBC WITH DIFFERENTIAL (CANCER CENTER ONLY)
Abs Immature Granulocytes: 0.07 10*3/uL (ref 0.00–0.07)
Basophils Absolute: 0.1 10*3/uL (ref 0.0–0.1)
Basophils Relative: 1 %
Eosinophils Absolute: 0.1 10*3/uL (ref 0.0–0.5)
Eosinophils Relative: 1 %
HCT: 38.8 % (ref 36.0–46.0)
Hemoglobin: 13 g/dL (ref 12.0–15.0)
Immature Granulocytes: 1 %
Lymphocytes Relative: 39 %
Lymphs Abs: 5.3 10*3/uL — ABNORMAL HIGH (ref 0.7–4.0)
MCH: 26.8 pg (ref 26.0–34.0)
MCHC: 33.5 g/dL (ref 30.0–36.0)
MCV: 80 fL (ref 80.0–100.0)
Monocytes Absolute: 1 10*3/uL (ref 0.1–1.0)
Monocytes Relative: 7 %
Neutro Abs: 7 10*3/uL (ref 1.7–7.7)
Neutrophils Relative %: 51 %
Platelet Count: 303 10*3/uL (ref 150–400)
RBC: 4.85 MIL/uL (ref 3.87–5.11)
RDW: 14.6 % (ref 11.5–15.5)
WBC Count: 13.6 10*3/uL — ABNORMAL HIGH (ref 4.0–10.5)
nRBC: 0 % (ref 0.0–0.2)

## 2020-01-13 LAB — RETIC PANEL
Immature Retic Fract: 17.4 % — ABNORMAL HIGH (ref 2.3–15.9)
RBC.: 4.87 MIL/uL (ref 3.87–5.11)
Retic Count, Absolute: 115.9 10*3/uL (ref 19.0–186.0)
Retic Ct Pct: 2.4 % (ref 0.4–3.1)
Reticulocyte Hemoglobin: 33.6 pg (ref >27.9)

## 2020-01-13 LAB — FERRITIN: Ferritin: 16 ng/mL (ref 11–307)

## 2020-01-13 NOTE — Progress Notes (Signed)
[place] Telephone:([phone]   Fax:([phone]) [place] NOTE  Patient Care Team: Heather Bang, DO as PCP - General (Family Medicine)  Hematological/Oncological History # IgG Lambda Monoclonal Gammopathy 1) 11/19/2019: SPEP shows M protein 0.7, IFE IgG lambda monoclonal protein.  2) 01/13/2020: establish care with Dr. Lorenso Campos   CHIEF COMPLAINTS/PURPOSE OF CONSULTATION:  "Abnormal IFE "  HISTORY OF PRESENTING ILLNESS:  Heather Campos 44 y.o. female with medical history significant for GERD, hypothyroidism, iron deficiency anemia, and OSA who presents for evaluation of a monoclonal gammopathy.   On review of the previous records patient is followed by rheumatology and as part of an evaluation for chronic left shoulder pain the patient had an SPEP drawn.  This SPEP showed an M protein of 0.7, with IFE showing an IgG lambda monoclonal protein.  Due to concern for this monoclonal gammopathy the patient was referred to hematology for further evaluation and management  On exam today Heather Campos notes that she was told she needs to be seen but is not entirely sure why.  She reports that she had a Covid infection and had unfortunately been having health issues since that time.  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  She notes that she also has a history of iron deficiency anemia but that this stopped in approximately May 2021 when she underwent a hysterectomy.  She notes that she has not routinely taken iron pills as these do cause constipation.  She notes that at this current time her energy level is good and she has good levels of energy.  She denies having any issues with bone pain, back pain, or new urinary symptoms.  On further discussion she notes that she carries sickle cell trait and that this comes from her father side of the family.  She reports that her mother  had a history of lupus and has undergone kidney transplant.  She also notes that her first cousin has undergone kidney transplant.  She currently works for [employer]-and denies any history of smoking, drinking, or recreational drug use.  Overall she denies having any issues with fevers, chills, sweats, nausea, vomiting or diarrhea.  A full 10 point ROS is listed below.  MEDICAL HISTORY:  Past Medical History:  Diagnosis Date  . Allergic rhinitis   . Anxiety   . Chronic back pain   . Family history of systemic lupus erythematosus   . GERD (gastroesophageal reflux disease)   . Hypothyroidism due to Hashimoto's thyroiditis   . IDA (iron deficiency anemia)   . Morbid obesity ([place])   . OSA (obstructive sleep apnea)    No CPAP  . Vitamin D deficiency     SURGICAL HISTORY: Past Surgical History:  Procedure Laterality Date  . ABDOMINAL HYSTERECTOMY    . CESAREAN SECTION    . CHOLECYSTECTOMY    . CYSTOSCOPY  07/04/2019   Procedure: CYSTOSCOPY;  Surgeon: Heather Kava, MD;  Location: [facility];  Service: Gynecology;;  . HYSTERECTOMY ABDOMINAL WITH SALPINGECTOMY Bilateral 07/04/2019   Procedure: ABDOMINAL  suprcervical hysterctomyWITH SALPINGECTOMY ;  Surgeon: Heather Kava, MD;  Location: [place];  Service: Gynecology;  Laterality: Bilateral;  . TOTAL LAPAROSCOPIC HYSTERECTOMY WITH SALPINGECTOMY Bilateral 07/04/2019   Procedure: attemted TOTAL LAPAROSCOPIC HYSTERECTOMY WITH SALPINGECTOMY;  Surgeon: Heather Kava, MD;  Location: [place];  Service: Gynecology;  Laterality: Bilateral;  3 hours    SOCIAL  HISTORY: Social History   Socioeconomic History  . Marital status: Married    Spouse name: Not on file  . Number of children: Not on file  . Years of education: Not on file  . Highest education level: Not on file  Occupational History  . Not on file  Tobacco Use  . Smoking status: Former Research scientist (life sciences)  . Smokeless tobacco: Never Used  Vaping Use  .  Vaping Use: Never used  Substance and Sexual Activity  . Alcohol use: No  . Drug use: No  . Sexual activity: Yes    Birth control/protection: None  Other Topics Concern  . Not on file  Social History Narrative  . Not on file   Social Determinants of Health   Financial Resource Strain:   . Difficulty of Paying Living Expenses: Not on file  Food Insecurity:   . Worried About Charity fundraiser in the Last Year: Not on file  . Ran Out of Food in the Last Year: Not on file  Transportation Needs:   . Lack of Transportation (Medical): Not on file  . Lack of Transportation (Non-Medical): Not on file  Physical Activity:   . Days of Exercise per Week: Not on file  . Minutes of Exercise per Session: Not on file  Stress:   . Feeling of Stress : Not on file  Social Connections:   . Frequency of Communication with Friends and Family: Not on file  . Frequency of Social Gatherings with Friends and Family: Not on file  . Attends Religious Services: Not on file  . Active Member of Clubs or Organizations: Not on file  . Attends Archivist Meetings: Not on file  . Marital Status: Not on file  Intimate Partner Violence:   . Fear of Current or Ex-Partner: Not on file  . Emotionally Abused: Not on file  . Physically Abused: Not on file  . Sexually Abused: Not on file    FAMILY HISTORY: Family History  Problem Relation Age of Onset  . Lupus Mother   . Kidney disease Mother   . Lupus Cousin     ALLERGIES:  is allergic to no known allergies.  MEDICATIONS:  Current Outpatient Medications  Medication Sig Dispense Refill  . clindamycin (CLEOCIN T) 1 % lotion Apply 1 application topically daily.     Marland Kitchen doxycycline (ADOXA) 100 MG tablet Take 100 mg by mouth daily.    . folic acid (FOLVITE) 1 MG tablet Take 1 mg by mouth daily.    Marland Kitchen gabapentin (NEURONTIN) 300 MG capsule Take 300 mg by mouth daily.    Marland Kitchen HYDROcodone-acetaminophen (NORCO) 10-325 MG tablet Take 1 tablet by mouth every  8 (eight) hours as needed.    Marland Kitchen levothyroxine (SYNTHROID) 137 MCG tablet Take 1 tablet (137 mcg total) by mouth daily. 30 tablet 1  . omeprazole (PRILOSEC) 40 MG capsule Take 1 capsule (40 mg total) by mouth daily as needed. 90 capsule 1  . Vitamin D, Ergocalciferol, (DRISDOL) 1.25 MG (50000 UNIT) CAPS capsule Take 1 capsule (50,000 Units total) by mouth every 7 (seven) days. 4 capsule 2  . zolpidem (AMBIEN CR) 6.25 MG CR tablet Take 1 tablet (6.25 mg total) by mouth at bedtime as needed for sleep. 30 tablet 0   No current facility-administered medications for this visit.    REVIEW OF SYSTEMS:   Constitutional: ( - ) fevers, ( - )  chills , ( - ) night sweats Eyes: ( - ) blurriness of  vision, ( - ) double vision, ( - ) watery eyes Ears, nose, mouth, throat, and face: ( - ) mucositis, ( - ) sore throat Respiratory: ( - ) cough, ( - ) dyspnea, ( - ) wheezes Cardiovascular: ( - ) palpitation, ( - ) chest discomfort, ( - ) lower extremity swelling Gastrointestinal:  ( - ) nausea, ( - ) heartburn, ( - ) change in bowel habits Skin: ( - ) abnormal skin rashes Lymphatics: ( - ) new lymphadenopathy, ( - ) easy bruising Neurological: ( - ) numbness, ( - ) tingling, ( - ) new weaknesses Behavioral/Psych: ( - ) mood change, ( - ) new changes  All other systems were reviewed with the patient and are negative.  PHYSICAL EXAMINATION: ECOG PERFORMANCE STATUS: 0 - Asymptomatic  Vitals:   01/13/20 0907  BP: 131/90  Pulse: 77  Resp: 16  Temp: 97.6 F (36.4 C)  SpO2: 100%   Filed Weights   01/13/20 0907  Weight: 252 lb 1.6 oz (114.4 kg)    GENERAL: well appearing young African American female in NAD  SKIN: skin color, texture, turgor are normal, no rashes or significant lesions EYES: conjunctiva are pink and non-injected, sclera clear OROPHARYNX: no exudate, no erythema; lips, buccal mucosa, and tongue normal  LUNGS: clear to auscultation and percussion with normal breathing effort HEART:  regular rate & rhythm and no murmurs and no lower extremity edema Musculoskeletal: no cyanosis of digits and no clubbing  PSYCH: alert & oriented x 3, fluent speech NEURO: no focal motor/sensory deficits  LABORATORY DATA:  I have reviewed the data as listed CBC Latest Ref Rng & Units 01/13/2020 11/30/2019 07/05/2019  WBC 4.0 - 10.5 K/uL 13.6(H) 10.0 12.4(H)  Hemoglobin 12.0 - 15.0 g/dL 13.0 13.5 9.0(L)  Hematocrit 36 - 46 % 38.8 41.4 28.3(L)  Platelets 150 - 400 K/uL 303 279 221    CMP Latest Ref Rng & Units 01/13/2020 12/26/2019 11/30/2019  Glucose 70 - 99 mg/dL 91 95 115(H)  BUN 6 - 20 mg/dL '8 8 7  ' Creatinine 0.44 - 1.00 mg/dL 0.83 0.73 0.77  Sodium 135 - 145 mmol/L 137 138 141  Potassium 3.5 - 5.1 mmol/L 3.8 4.6 4.0  Chloride 98 - 111 mmol/L 106 100 101  CO2 22 - 32 mmol/L '24 24 27  ' Calcium 8.9 - 10.3 mg/dL 8.8(L) 9.5 9.6  Total Protein 6.5 - 8.1 g/dL 7.4 - -  Total Bilirubin 0.3 - 1.2 mg/dL 0.7 - -  Alkaline Phos 38 - 126 U/L 82 - -  AST 15 - 41 U/L 13(L) - -  ALT 0 - 44 U/L 15 - -   Lab Results  Component Value Date   MPROTEIN 0.6 (H) 01/13/2020   Lab Results  Component Value Date   KPAFRELGTCHN 18.3 01/13/2020   LAMBDASER 15.8 01/13/2020   KAPLAMBRATIO 1.16 01/13/2020     RADIOGRAPHIC STUDIES: No results found.  ASSESSMENT & PLAN Heather Campos 44 y.o. female with medical history significant for GERD, hypothyroidism, iron deficiency anemia, and OSA who presents for evaluation of a monoclonal gammopathy.  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  In order to complete the work-up the patient require a full SPEP, UPEP, serum free light chains, and beta-2 microglobulin with LDH.  We will also need a metastatic survey in order to assure that there are no lytic lesions.  Assuming the patient has no  concerning findings on the above work-up we will continue  routine surveillance and subsequent 12-monthfollow-ups after that time.  The patient voiced her understanding of this plan moving forward.   # IgG Lambda Monoclonal Gammopathy of Undetermined Significance --today will order an SPEP, UPEP, SFLC and beta 2 microglobulin --additionally will collect new baseline CBC, CMP, and LDH --recommend a metastatic bone survey to assess for lytic lesions --will consider the need for a bone marrow biopsy pending the above results. No indication present at this time.  --RTC in 3 months or sooner if indicated by the above labs.   Orders Placed This Encounter  Procedures  . CBC with Differential (Cancer Center Only)    Standing Status:   Future    Number of Occurrences:   1    Standing Expiration Date:   01/12/2021  . CMP ([place])    Standing Status:   Future    Number of Occurrences:   1    Standing Expiration Date:   01/12/2021  . Lactate dehydrogenase (LDH)    Standing Status:   Future    Number of Occurrences:   1    Standing Expiration Date:   01/12/2021  . Multiple Myeloma Panel (SPEP&IFE w/QIG)    Standing Status:   Future    Number of Occurrences:   1    Standing Expiration Date:   01/12/2021  . Kappa/lambda light chains    Standing Status:   Future    Number of Occurrences:   1    Standing Expiration Date:   01/12/2021  . Beta 2 microglobulin    Standing Status:   Future    Number of Occurrences:   1    Standing Expiration Date:   01/12/2021  . 24-Hr Ur UPEP/UIFE/Light Chains/TP    Standing Status:   Future    Standing Expiration Date:   01/12/2021  . Iron and TIBC    Standing Status:   Future    Number of Occurrences:   1    Standing Expiration Date:   01/12/2021  . Ferritin    Standing Status:   Future    Number of Occurrences:   1    Standing Expiration Date:   01/12/2021  . Retic Panel    Standing Status:   Future    Number of Occurrences:   1    Standing Expiration Date:   01/12/2021   All questions were  answered. The patient knows to call the clinic with any problems, questions or concerns.  A total of more than 60 minutes were spent on this encounter and over half of that time was spent on counseling and coordination of care as outlined above.   JLedell Peoples MD Department of Hematology/Oncology [facility]Phone: [phone]: [phone]: [email or web address]  01/19/2020 7:39 PM

## 2020-01-14 LAB — MULTIPLE MYELOMA PANEL, SERUM
Albumin SerPl Elph-Mcnc: 3.4 g/dL (ref 2.9–4.4)
Albumin/Glob SerPl: 1 (ref 0.7–1.7)
Alpha 1: 0.2 g/dL (ref 0.0–0.4)
Alpha2 Glob SerPl Elph-Mcnc: 0.7 g/dL (ref 0.4–1.0)
B-Globulin SerPl Elph-Mcnc: 1.2 g/dL (ref 0.7–1.3)
Gamma Glob SerPl Elph-Mcnc: 1.5 g/dL (ref 0.4–1.8)
Globulin, Total: 3.6 g/dL (ref 2.2–3.9)
IgA: 177 mg/dL (ref 87–352)
IgG (Immunoglobin G), Serum: 1611 mg/dL — ABNORMAL HIGH (ref 586–1602)
IgM (Immunoglobulin M), Srm: 36 mg/dL (ref 26–217)
M Protein SerPl Elph-Mcnc: 0.6 g/dL — ABNORMAL HIGH
Total Protein ELP: 7 g/dL (ref 6.0–8.5)

## 2020-01-14 LAB — KAPPA/LAMBDA LIGHT CHAINS
Kappa free light chain: 18.3 mg/L (ref 3.3–19.4)
Kappa, lambda light chain ratio: 1.16 (ref 0.26–1.65)
Lambda free light chains: 15.8 mg/L (ref 5.7–26.3)

## 2020-01-14 LAB — BETA 2 MICROGLOBULIN, SERUM: Beta-2 Microglobulin: 1.2 mg/L (ref 0.6–2.4)

## 2020-01-15 ENCOUNTER — Telehealth: Payer: Self-pay | Admitting: Hematology and Oncology

## 2020-01-15 NOTE — Telephone Encounter (Signed)
Scheduled per 11/22 los. Called and spoke with pt, confirmed 2/23 appts

## 2020-01-18 ENCOUNTER — Other Ambulatory Visit: Payer: Self-pay | Admitting: Internal Medicine

## 2020-01-18 DIAGNOSIS — E038 Other specified hypothyroidism: Secondary | ICD-10-CM

## 2020-01-18 DIAGNOSIS — E063 Autoimmune thyroiditis: Secondary | ICD-10-CM

## 2020-01-19 ENCOUNTER — Encounter: Payer: Self-pay | Admitting: Hematology and Oncology

## 2020-01-20 ENCOUNTER — Other Ambulatory Visit: Payer: Self-pay

## 2020-01-20 DIAGNOSIS — E038 Other specified hypothyroidism: Secondary | ICD-10-CM

## 2020-01-21 DIAGNOSIS — D472 Monoclonal gammopathy: Secondary | ICD-10-CM | POA: Diagnosis not present

## 2020-01-21 MED ORDER — LEVOTHYROXINE SODIUM 137 MCG PO TABS: 137.0000 ug | ORAL_TABLET | Freq: Every day | ORAL | 0 refills | Status: DC

## 2020-01-22 ENCOUNTER — Other Ambulatory Visit (INDEPENDENT_AMBULATORY_CARE_PROVIDER_SITE_OTHER): Payer: 59

## 2020-01-22 ENCOUNTER — Other Ambulatory Visit: Payer: Self-pay

## 2020-01-22 ENCOUNTER — Other Ambulatory Visit: Payer: Self-pay | Admitting: Hematology and Oncology

## 2020-01-22 DIAGNOSIS — E559 Vitamin D deficiency, unspecified: Secondary | ICD-10-CM

## 2020-01-22 DIAGNOSIS — D472 Monoclonal gammopathy: Secondary | ICD-10-CM

## 2020-01-23 ENCOUNTER — Other Ambulatory Visit: Payer: Self-pay | Admitting: Hematology and Oncology

## 2020-01-23 DIAGNOSIS — D472 Monoclonal gammopathy: Secondary | ICD-10-CM

## 2020-01-23 LAB — UPEP/UIFE/LIGHT CHAINS/TP, 24-HR UR
% BETA, Urine: 0 %
ALPHA 1 URINE: 0 %
Albumin, U: 0 %
Alpha 2, Urine: 0 %
Free Kappa Lt Chains,Ur: 9.22 mg/L (ref 0.63–113.79)
Free Kappa/Lambda Ratio: 6.73 (ref 1.03–31.76)
Free Lambda Lt Chains,Ur: 1.37 mg/L (ref 0.47–11.77)
GAMMA GLOBULIN URINE: 0 %
Total Protein, Urine-Ur/day: <124 mg/24 hr (ref 30–150)
Total Protein, Urine: <4 mg/dL

## 2020-01-23 LAB — VITAMIN D 25 HYDROXY (VIT D DEFICIENCY, FRACTURES): Vit D, 25-Hydroxy: 36.1 ng/mL (ref 30.0–100.0)

## 2020-01-27 ENCOUNTER — Telehealth: Payer: Self-pay

## 2020-01-27 ENCOUNTER — Ambulatory Visit: Payer: 59 | Admitting: Rheumatology

## 2020-01-27 DIAGNOSIS — G4733 Obstructive sleep apnea (adult) (pediatric): Secondary | ICD-10-CM

## 2020-01-27 DIAGNOSIS — R0681 Apnea, not elsewhere classified: Secondary | ICD-10-CM

## 2020-01-27 NOTE — Telephone Encounter (Signed)
So does she need another test done? Or can we order off 2020 results?   Phill Myron, D.O. Primary Care at [facility]  01/27/2020, 3:47 PM

## 2020-01-27 NOTE — Telephone Encounter (Signed)
Pt doesn't have a CPAP would like a test

## 2020-01-27 NOTE — Telephone Encounter (Signed)
Can you get more information?  Patient had a sleep study done in 08/2018 & has an auto-pap. Does she feel like the machine isn't working anymore?

## 2020-01-27 NOTE — Telephone Encounter (Signed)
The test last year only showed REM related sleep apnea & not OSA. She feels like that was inaccurate and with her witnessed apnea she'd like a new test ordered.  Order is pending.

## 2020-01-27 NOTE — Telephone Encounter (Signed)
Pt called asking for a sleep study to be done on her stated that her husband says she stops breathing at night

## 2020-01-28 NOTE — Telephone Encounter (Signed)
Order signed.   Phill Myron, D.O. Primary Care at [facility]  01/28/2020, 10:54 AM

## 2020-01-29 ENCOUNTER — Other Ambulatory Visit: Payer: Self-pay

## 2020-01-29 ENCOUNTER — Emergency Department (HOSPITAL_COMMUNITY)
Admission: EM | Admit: 2020-01-29 | Discharge: 2020-01-29 | Disposition: A | Discharge status: Home / Self Care | Payer: 59 | Attending: Emergency Medicine | Admitting: Emergency Medicine

## 2020-01-29 ENCOUNTER — Ambulatory Visit (HOSPITAL_COMMUNITY)
Admission: RE | Admit: 2020-01-29 | Discharge: 2020-01-29 | Disposition: A | Discharge status: Home / Self Care | Payer: 59 | Source: Ambulatory Visit | Attending: Hematology and Oncology | Admitting: Hematology and Oncology

## 2020-01-29 DIAGNOSIS — E039 Hypothyroidism, unspecified: Secondary | ICD-10-CM | POA: Diagnosis not present

## 2020-01-29 DIAGNOSIS — M542 Cervicalgia: Secondary | ICD-10-CM | POA: Diagnosis not present

## 2020-01-29 DIAGNOSIS — Z8616 Personal history of COVID-19: Secondary | ICD-10-CM | POA: Diagnosis not present

## 2020-01-29 DIAGNOSIS — D472 Monoclonal gammopathy: Secondary | ICD-10-CM

## 2020-01-29 DIAGNOSIS — S39012A Strain of muscle, fascia and tendon of lower back, initial encounter: Secondary | ICD-10-CM

## 2020-01-29 DIAGNOSIS — Z87891 Personal history of nicotine dependence: Secondary | ICD-10-CM | POA: Diagnosis not present

## 2020-01-29 DIAGNOSIS — M545 Low back pain, unspecified: Secondary | ICD-10-CM | POA: Insufficient documentation

## 2020-01-29 MED ORDER — METHOCARBAMOL 750 MG PO TABS: 750.0000 mg | ORAL_TABLET | Freq: Four times a day (QID) | ORAL | 0 refills | Status: DC
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
## 2020-02-03 ENCOUNTER — Telehealth: Payer: Self-pay | Admitting: *Deleted

## 2020-02-03 NOTE — Telephone Encounter (Signed)
TCT patient regarding bone scan results. Spoke with her and advised that her scan did not show any bone damage and no abnormal findings in her labs.  Advised that we would continue to monitor her blood for abnormal proteins every 3 months or so. Patient voiced understanding

## 2020-02-03 NOTE — Telephone Encounter (Signed)
-----   Message from Orson Slick, MD sent at 01/30/2020  9:05 AM EST ----- Please let Heather Campos know that her bone scan showed no evidence of bone damage. Additionally her other labs showed no 'red flags'. At this time we can continue to monitor the abnormal protein in her blood with routine clinic visits. There are no other specific interventions or recommendations for her MGUS at this time.   ----- Message ----- From: Interface, Rad Results In Sent: 01/29/2020   5:25 PM EST To: Orson Slick, MD

## 2020-02-15 ENCOUNTER — Encounter (HOSPITAL_COMMUNITY): Payer: Self-pay

## 2020-02-15 ENCOUNTER — Emergency Department (HOSPITAL_COMMUNITY)
Admission: EM | Admit: 2020-02-15 | Discharge: 2020-02-15 | Disposition: A | Discharge status: Home / Self Care | Payer: 59 | Attending: Emergency Medicine | Admitting: Emergency Medicine

## 2020-02-15 ENCOUNTER — Other Ambulatory Visit: Payer: Self-pay

## 2020-02-15 DIAGNOSIS — Z87891 Personal history of nicotine dependence: Secondary | ICD-10-CM | POA: Insufficient documentation

## 2020-02-15 DIAGNOSIS — E039 Hypothyroidism, unspecified: Secondary | ICD-10-CM | POA: Insufficient documentation

## 2020-02-15 DIAGNOSIS — S3992XA Unspecified injury of lower back, initial encounter: Secondary | ICD-10-CM | POA: Diagnosis present

## 2020-02-15 DIAGNOSIS — Y92410 Unspecified street and highway as the place of occurrence of the external cause: Secondary | ICD-10-CM | POA: Diagnosis not present

## 2020-02-15 DIAGNOSIS — S39012A Strain of muscle, fascia and tendon of lower back, initial encounter: Secondary | ICD-10-CM | POA: Diagnosis not present

## 2020-02-15 MED ORDER — IBUPROFEN 800 MG PO TABS: 800.0000 mg | ORAL_TABLET | Freq: Three times a day (TID) | ORAL | 0 refills | Status: DC

## 2020-02-15 MED ORDER — METHOCARBAMOL 750 MG PO TABS: 750.0000 mg | ORAL_TABLET | Freq: Three times a day (TID) | ORAL | 0 refills | Status: DC | PRN
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
## 2020-02-24 ENCOUNTER — Ambulatory Visit: Admission: EM | Admit: 2020-02-24 | Discharge: 2020-02-24 | Discharge status: Against Medical Advice | Payer: 59

## 2020-02-24 DIAGNOSIS — M79674 Pain in right toe(s): Secondary | ICD-10-CM | POA: Insufficient documentation

## 2020-02-24 NOTE — ED Notes (Signed)
Called in waiting room no response, called number on file no response.

## 2020-02-24 NOTE — ED Notes (Signed)
No answer x2 from waiting area 

## 2020-02-26 ENCOUNTER — Ambulatory Visit: Payer: 59 | Admitting: Rheumatology

## 2020-02-28 ENCOUNTER — Other Ambulatory Visit: Payer: Self-pay | Admitting: Internal Medicine

## 2020-02-28 ENCOUNTER — Other Ambulatory Visit: Payer: Self-pay

## 2020-02-28 DIAGNOSIS — E063 Autoimmune thyroiditis: Secondary | ICD-10-CM

## 2020-02-28 DIAGNOSIS — E038 Other specified hypothyroidism: Secondary | ICD-10-CM

## 2020-02-29 MED ORDER — LEVOTHYROXINE SODIUM 137 MCG PO TABS: 137.0000 ug | ORAL_TABLET | Freq: Every day | ORAL | 0 refills | Status: DC

## 2020-03-13 ENCOUNTER — Encounter (HOSPITAL_BASED_OUTPATIENT_CLINIC_OR_DEPARTMENT_OTHER): Payer: 59 | Admitting: Internal Medicine

## 2020-03-16 ENCOUNTER — Encounter (HOSPITAL_BASED_OUTPATIENT_CLINIC_OR_DEPARTMENT_OTHER): Payer: Self-pay

## 2020-03-16 ENCOUNTER — Other Ambulatory Visit: Payer: Self-pay

## 2020-03-16 ENCOUNTER — Ambulatory Visit (HOSPITAL_BASED_OUTPATIENT_CLINIC_OR_DEPARTMENT_OTHER): Payer: No Typology Code available for payment source

## 2020-03-18 ENCOUNTER — Telehealth (INDEPENDENT_AMBULATORY_CARE_PROVIDER_SITE_OTHER): Payer: 59 | Admitting: Internal Medicine

## 2020-03-18 DIAGNOSIS — F40243 Fear of flying: Secondary | ICD-10-CM

## 2020-03-18 MED ORDER — DIAZEPAM 5 MG PO TABS: 5.0000 mg | ORAL_TABLET | Freq: Two times a day (BID) | ORAL | 0 refills | Status: DC | PRN

## 2020-03-18 NOTE — Progress Notes (Signed)
Virtual Visit via MyChart Video Note  I connected with Heather Campos, on 03/18/2020 at 3:11 PM by MyChart video due to the COVID-19 pandemic and verified that I am speaking with the correct person using two identifiers.   Consent: I discussed the limitations, risks, security and privacy concerns of performing an evaluation and management service by telephone and the availability of in person appointments. I also discussed with the patient that there may be a patient responsible charge related to this service. The patient expressed understanding and agreed to proceed.   Location of Patient: Home   Location of Provider: Clinic    Persons participating in Telemedicine visit: Heather Campos [facility] Dr. Juleen China      History of Present Illness: Patient has a visit for concern about fear of flying. She was previously prescribed Valium by her former PCP. She is leaving Sunday morning to go to [place] to celebrate her birthday.    Past Medical History:  Diagnosis Date  . Allergic rhinitis   . Anxiety   . Chronic back pain   . Family history of systemic lupus erythematosus   . GERD (gastroesophageal reflux disease)   . Hypothyroidism due to Hashimoto's thyroiditis   . IDA (iron deficiency anemia)   . Morbid obesity ([place])   . OSA (obstructive sleep apnea)    No CPAP  . Vitamin D deficiency    Allergies  Allergen Reactions  . No Known Allergies     Current Outpatient Medications on File Prior to Visit  Medication Sig Dispense Refill  . EUTHYROX 137 MCG tablet Take 1 tablet by mouth once daily 30 tablet 0  . clindamycin (CLEOCIN T) 1 % lotion Apply 1 application topically daily.     Marland Kitchen doxycycline (ADOXA) 100 MG tablet Take 100 mg by mouth daily.    . folic acid (FOLVITE) 1 MG tablet Take 1 mg by mouth daily.    Marland Kitchen gabapentin (NEURONTIN) 300 MG capsule Take 300 mg by mouth daily.    Marland Kitchen HYDROcodone-acetaminophen (NORCO) 10-325 MG tablet Take 1 tablet by mouth every 8  (eight) hours as needed.    Marland Kitchen ibuprofen (ADVIL) 800 MG tablet Take 1 tablet (800 mg total) by mouth 3 (three) times daily. 21 tablet 0  . methocarbamol (ROBAXIN-750) 750 MG tablet Take 1 tablet (750 mg total) by mouth every 8 (eight) hours as needed for muscle spasms. 30 tablet 0  . omeprazole (PRILOSEC) 40 MG capsule Take 1 capsule (40 mg total) by mouth daily as needed. 90 capsule 1  . Vitamin D, Ergocalciferol, (DRISDOL) 1.25 MG (50000 UNIT) CAPS capsule Take 1 capsule (50,000 Units total) by mouth every 7 (seven) days. 4 capsule 2  . zolpidem (AMBIEN CR) 6.25 MG CR tablet Take 1 tablet (6.25 mg total) by mouth at bedtime as needed for sleep. 30 tablet 0   No current facility-administered medications on file prior to visit.    Observations/Objective: NAD. Speaking clearly.  Work of breathing normal.  Alert and oriented. Mood appropriate.   Assessment and Plan: 1. Fear of flying Will prescribe previously used benzo for fear of flying. Discussed appropriate use, how to take medication, and potential side effects.  - diazepam (VALIUM) 5 MG tablet; Take 1 tablet (5 mg total) by mouth every 12 (twelve) hours as needed for anxiety.  Dispense: 10 tablet; Refill: 0   Follow Up Instructions: PRN and for routine medical care    I discussed the assessment and treatment plan with the patient. The patient  was provided an opportunity to ask questions and all were answered. The patient agreed with the plan and demonstrated an understanding of the instructions.   The patient was advised to call back or seek an in-person evaluation if the symptoms worsen or if the condition fails to improve as anticipated.     I provided 10 minutes total of non-face-to-face time during this encounter including median intraservice time, reviewing previous notes, investigations, ordering medications, medical decision making, coordinating care and patient verbalized understanding at the end of the  visit.    Phill Myron, D.O. Primary Care at [facility]  03/18/2020, 3:11 PM

## 2020-04-07 ENCOUNTER — Ambulatory Visit (HOSPITAL_BASED_OUTPATIENT_CLINIC_OR_DEPARTMENT_OTHER): Payer: 59 | Attending: Internal Medicine | Admitting: Internal Medicine

## 2020-04-07 ENCOUNTER — Other Ambulatory Visit: Payer: Self-pay

## 2020-04-07 DIAGNOSIS — R0681 Apnea, not elsewhere classified: Secondary | ICD-10-CM

## 2020-04-07 DIAGNOSIS — G4733 Obstructive sleep apnea (adult) (pediatric): Secondary | ICD-10-CM | POA: Insufficient documentation

## 2020-04-13 ENCOUNTER — Ambulatory Visit: Payer: 59 | Admitting: Physician Assistant

## 2020-04-13 ENCOUNTER — Other Ambulatory Visit: Payer: Self-pay

## 2020-04-13 VITALS — BP 130/88 | HR 94 | Temp 98.2°F | Resp 18 | Ht 64.0 in | Wt 256.0 lb

## 2020-04-13 DIAGNOSIS — H6690 Otitis media, unspecified, unspecified ear: Secondary | ICD-10-CM | POA: Diagnosis not present

## 2020-04-13 DIAGNOSIS — E559 Vitamin D deficiency, unspecified: Secondary | ICD-10-CM

## 2020-04-13 DIAGNOSIS — E038 Other specified hypothyroidism: Secondary | ICD-10-CM

## 2020-04-13 DIAGNOSIS — G4700 Insomnia, unspecified: Secondary | ICD-10-CM

## 2020-04-13 DIAGNOSIS — E063 Autoimmune thyroiditis: Secondary | ICD-10-CM

## 2020-04-13 MED ORDER — AMOXICILLIN-POT CLAVULANATE 875-125 MG PO TABS: 1.0000 | ORAL_TABLET | Freq: Two times a day (BID) | ORAL | 0 refills | Status: DC

## 2020-04-13 MED ORDER — LEVOTHYROXINE SODIUM 137 MCG PO TABS: 137.0000 ug | ORAL_TABLET | Freq: Every day | ORAL | 2 refills | Status: DC

## 2020-04-13 MED ORDER — TRAZODONE HCL 50 MG PO TABS: 25.0000 mg | ORAL_TABLET | Freq: Every evening | ORAL | 3 refills | Status: DC | PRN

## 2020-04-13 NOTE — Patient Instructions (Signed)
You will take Augmentin twice a day for the next 10 days.  I encourage you to pause on the doxycycline while you take the Augmentin.  I encourage you to take a daily allergy medication such as Claritin or Zyrtec.  You can purchase the generic version of these over-the-counter.  I would take this on a daily basis.  I encourage you to take 2000 units of vitamin D once a day to help maintain vitamin D  Levels.  I encourage you to use 25 to 50 mg of trazodone 30 minutes prior to bedtime to help with insomnia.  You can continue to use the melatonin for the next couple of nights and then see if the trazodone is offering relief on its own.  Please let us know if there is anything else we can do for you  Kennieth Rad, PA-C Physician Assistant [place] [email or web address]    Otitis Media, Adult  Otitis media occurs when there is inflammation and fluid in the middle ear space with signs and symptoms of an acute infection. The middle ear is a part of the ear that contains bones for hearing as well as air that helps send sounds to the brain. When infected fluid builds up in this space, it causes pressure and results in symptoms of acute otitis media. The eustachian tube connects the middle ear to the back of the nose (nasopharynx) and normally allows air into the middle ear space. If the eustachian tube becomes blocked, fluid can build up and become infected. What are the causes? This condition is caused by a blockage in the eustachian tube. This can be caused by an object like mucus, or by swelling (edema) of the tube. Problems that can cause a blockage include:  A cold or other upper respiratory infection.  Allergies.  An irritant, such as tobacco smoke.  Enlarged adenoids. The adenoids are areas of soft tissue located high in the back of the throat, behind the nose and the roof of the mouth. They are part of the body's defense system  (immune system).  A mass in the nasopharynx.  Damage to the ear caused by pressure changes (barotrauma). What are the signs or symptoms? Symptoms of this condition include:  Ear pain.  Fever.  Decreased hearing.  Tiredness (lethargy).  Fluid leaking from the ear, if the eardrum is ruptured or has burst.  Ringing in the ear. How is this diagnosed? This condition is diagnosed with a physical exam. During the exam, your health care provider will use an instrument called an otoscope to look in your ear and check for redness, swelling, and fluid. He or she will also ask about your symptoms. Your health care provider may also order tests, such as:  A pneumatic otoscopy. This is a test to check the movement of the eardrum. It is done by squeezing a small amount of air into the ear.  A tympanogram is a test that shows how well the eardrum moves in response to air pressure in the ear canal. It provides a graph for your health care provider to review.   How is this treated? This condition can go away on its own within 3-5 days. But if the condition is caused by a bacterial infection and does not go away on its own, or if it keeps coming back, your health care provider may:  Prescribe antibiotic medicine to treat the infection.  Prescribe or recommend medicines to control pain. Follow these instructions at home:  Take over-the-counter and prescription medicines only as told by your health care provider.  If you were prescribed an antibiotic medicine, take it as told by your health care provider. Do not stop taking the antibiotic even if you start to feel better.  Keep all follow-up visits as told by your health care provider. This is important. Contact a health care provider if:  You have bleeding from your nose.  There is a lump on your neck.  You are not feeling better in 5 days.  You feel worse instead of better. Get help right away if:  You have severe pain that is not  controlled with medicine.  You have swelling, redness, or pain around your ear.  You have stiffness in your neck.  A part of your face is not moving (paralyzed).  The bone behind your ear (mastoid) is tender when you touch it.  You develop a severe headache. Summary  Otitis media is redness, soreness, and swelling of the middle ear, usually resulting in pain.  This condition can go away on its own within 3-5 days.  If the problem does not go away in 3-5 days, your health care provider may prescribe or recommend medicines to treat the infection or your symptoms.  If you were prescribed an antibiotic medicine, take it as told by your health care provider.  Follow all instructions you were given by your health care provider. This information is not intended to replace advice given to you by your health care provider. Make sure you discuss any questions you have with your health care provider. Document Revised: 01/10/2019 Document Reviewed: 01/10/2019 Elsevier Patient Education  2021 [employer].   Insomnia Insomnia is a sleep disorder that makes it difficult to fall asleep or stay asleep. Insomnia can cause fatigue, low energy, difficulty concentrating, mood swings, and poor performance at work or school. There are three different ways to classify insomnia:  Difficulty falling asleep.  Difficulty staying asleep.  Waking up too early in the morning. Any type of insomnia can be long-term (chronic) or short-term (acute). Both are common. Short-term insomnia usually lasts for three months or less. Chronic insomnia occurs at least three times a week for longer than three months. What are the causes? Insomnia may be caused by another condition, situation, or substance, such as:  Anxiety.  Certain medicines.  Gastroesophageal reflux disease (GERD) or other gastrointestinal conditions.  Asthma or other breathing conditions.  Restless legs syndrome, sleep apnea, or other sleep  disorders.  Chronic pain.  Menopause.  Stroke.  Abuse of alcohol, tobacco, or illegal drugs.  Mental health conditions, such as depression.  Caffeine.  Neurological disorders, such as Alzheimer's disease.  An overactive thyroid (hyperthyroidism). Sometimes, the cause of insomnia may not be known. What increases the risk? Risk factors for insomnia include:  Gender. Women are affected more often than men.  Age. Insomnia is more common as you get older.  Stress.  Lack of exercise.  Irregular work schedule or working night shifts.  Traveling between different time zones.  Certain medical and mental health conditions. What are the signs or symptoms? If you have insomnia, the main symptom is having trouble falling asleep or having trouble staying asleep. This may lead to other symptoms, such as:  Feeling fatigued or having low energy.  Feeling nervous about going to sleep.  Not feeling rested in the morning.  Having trouble concentrating.  Feeling irritable, anxious, or depressed. How is this diagnosed? This condition may be diagnosed based  on:  Your symptoms and medical history. Your health care provider may ask about: ? Your sleep habits. ? Any medical conditions you have. ? Your mental health.  A physical exam. How is this treated? Treatment for insomnia depends on the cause. Treatment may focus on treating an underlying condition that is causing insomnia. Treatment may also include:  Medicines to help you sleep.  Counseling or therapy.  Lifestyle adjustments to help you sleep better. Follow these instructions at home: Eating and drinking  Limit or avoid alcohol, caffeinated beverages, and cigarettes, especially close to bedtime. These can disrupt your sleep.  Do not eat a large meal or eat spicy foods right before bedtime. This can lead to digestive discomfort that can make it hard for you to sleep.   Sleep habits  Keep a sleep diary to help you  and your health care provider figure out what could be causing your insomnia. Write down: ? When you sleep. ? When you wake up during the night. ? How well you sleep. ? How rested you feel the next day. ? Any side effects of medicines you are taking. ? What you eat and drink.  Make your bedroom a dark, comfortable place where it is easy to fall asleep. ? Put up shades or blackout curtains to block light from outside. ? Use a white noise machine to block noise. ? Keep the temperature cool.  Limit screen use before bedtime. This includes: ? Watching TV. ? Using your smartphone, tablet, or computer.  Stick to a routine that includes going to bed and waking up at the same times every day and night. This can help you fall asleep faster. Consider making a quiet activity, such as reading, part of your nighttime routine.  Try to avoid taking naps during the day so that you sleep better at night.  Get out of bed if you are still awake after 15 minutes of trying to sleep. Keep the lights down, but try reading or doing a quiet activity. When you feel sleepy, go back to bed.   General instructions  Take over-the-counter and prescription medicines only as told by your health care provider.  Exercise regularly, as told by your health care provider. Avoid exercise starting several hours before bedtime.  Use relaxation techniques to manage stress. Ask your health care provider to suggest some techniques that may work well for you. These may include: ? Breathing exercises. ? Routines to release muscle tension. ? Visualizing peaceful scenes.  Make sure that you drive carefully. Avoid driving if you feel very sleepy.  Keep all follow-up visits as told by your health care provider. This is important. Contact a health care provider if:  You are tired throughout the day.  You have trouble in your daily routine due to sleepiness.  You continue to have sleep problems, or your sleep problems get  worse. Get help right away if:  You have serious thoughts about hurting yourself or someone else. If you ever feel like you may hurt yourself or others, or have thoughts about taking your own life, get help right away. You can go to your nearest emergency department or call:  Your local emergency services (911 in the [place]  A suicide crisis helpline, such as the [facility] at 2493884627. This is open 24 hours a day. Summary  Insomnia is a sleep disorder that makes it difficult to fall asleep or stay asleep.  Insomnia can be long-term (chronic) or short-term (acute).  Treatment  for insomnia depends on the cause. Treatment may focus on treating an underlying condition that is causing insomnia.  Keep a sleep diary to help you and your health care provider figure out what could be causing your insomnia. This information is not intended to replace advice given to you by your health care provider. Make sure you discuss any questions you have with your health care provider. Document Revised: 12/19/2019 Document Reviewed: 12/19/2019 Elsevier Patient Education  2021 [employer].

## 2020-04-13 NOTE — Progress Notes (Signed)
Established Patient Office Visit  Subjective:  Patient ID: Heather Campos, female    DOB: 08/05/1975  Age: 45 y.o. MRN: [id number]  CC:  Chief Complaint  Patient presents with  . Ear Fullness    Left    HPI Jim Philemon reports that she has been having a feeling of fullness, muffled hearing, discomfort in her left ear for the last week.  Reports her tonsils on her left side feels swollen, states that they woke her from her sleep 2 nights ago and she thought she was having difficulty breathing.  Denies cough, fever, any other URI symptoms.  Reports that she has tried doing to warm shower rinses without any relief of ear fullness.  Reports that she has been having difficulty sleeping despite taking 10 mg of melatonin.  Reports that she will fall asleep but wake up 3 hours later and not be able to fall back asleep.  Endorses good sleep hygiene.  Reports that she has previously taken Ambien with relief.  Has previously failed Benadryl.  Reports that this has been ongoing for the past 6 months.  Request refill of thyroid medication prior to PCP visit   Past Medical History:  Diagnosis Date  . Allergic rhinitis   . Anxiety   . Chronic back pain   . Family history of systemic lupus erythematosus   . GERD (gastroesophageal reflux disease)   . Hypothyroidism due to Hashimoto's thyroiditis   . IDA (iron deficiency anemia)   . Morbid obesity ([place])   . OSA (obstructive sleep apnea)    No CPAP  . Vitamin D deficiency     Past Surgical History:  Procedure Laterality Date  . ABDOMINAL HYSTERECTOMY    . CESAREAN SECTION    . CHOLECYSTECTOMY    . CYSTOSCOPY  07/04/2019   Procedure: CYSTOSCOPY;  Surgeon: Sanjuana Kava, MD;  Location: [facility];  Service: Gynecology;;  . HYSTERECTOMY ABDOMINAL WITH SALPINGECTOMY Bilateral 07/04/2019   Procedure: ABDOMINAL  suprcervical hysterctomyWITH SALPINGECTOMY ;  Surgeon: Sanjuana Kava, MD;  Location: [place];   Service: Gynecology;  Laterality: Bilateral;  . TOTAL LAPAROSCOPIC HYSTERECTOMY WITH SALPINGECTOMY Bilateral 07/04/2019   Procedure: attemted TOTAL LAPAROSCOPIC HYSTERECTOMY WITH SALPINGECTOMY;  Surgeon: Sanjuana Kava, MD;  Location: [place];  Service: Gynecology;  Laterality: Bilateral;  3 hours    Family History  Problem Relation Age of Onset  . Lupus Mother   . Kidney disease Mother   . Lupus Cousin     Social History   Socioeconomic History  . Marital status: Married    Spouse name: Not on file  . Number of children: Not on file  . Years of education: Not on file  . Highest education level: Not on file  Occupational History  . Not on file  Tobacco Use  . Smoking status: Former Research scientist (life sciences)  . Smokeless tobacco: Never Used  Vaping Use  . Vaping Use: Never used  Substance and Sexual Activity  . Alcohol use: No  . Drug use: No  . Sexual activity: Yes    Birth control/protection: None  Other Topics Concern  . Not on file  Social History Narrative  . Not on file   Social Determinants of Health   Financial Resource Strain: Not on file  Food Insecurity: Not on file  Transportation Needs: Not on file  Physical Activity: Not on file  Stress: Not on file  Social Connections: Not on file  Intimate Partner Violence: Not on file  Outpatient Medications Prior to Visit  Medication Sig Dispense Refill  . clindamycin (CLEOCIN T) 1 % lotion Apply 1 application topically daily.     Marland Kitchen doxycycline (ADOXA) 100 MG tablet Take 100 mg by mouth daily.    . folic acid (FOLVITE) 1 MG tablet Take 1 mg by mouth daily.    Marland Kitchen gabapentin (NEURONTIN) 300 MG capsule Take 300 mg by mouth daily.    Marland Kitchen HYDROcodone-acetaminophen (NORCO) 10-325 MG tablet Take 1 tablet by mouth every 8 (eight) hours as needed.    Marland Kitchen ibuprofen (ADVIL) 800 MG tablet Take 1 tablet (800 mg total) by mouth 3 (three) times daily. 21 tablet 0  . methocarbamol (ROBAXIN-750) 750 MG tablet Take 1 tablet (750 mg  total) by mouth every 8 (eight) hours as needed for muscle spasms. 30 tablet 0  . omeprazole (PRILOSEC) 40 MG capsule Take 1 capsule (40 mg total) by mouth daily as needed. 90 capsule 1  . Vitamin D, Ergocalciferol, (DRISDOL) 1.25 MG (50000 UNIT) CAPS capsule Take 1 capsule (50,000 Units total) by mouth every 7 (seven) days. 4 capsule 2  . zolpidem (AMBIEN CR) 6.25 MG CR tablet Take 1 tablet (6.25 mg total) by mouth at bedtime as needed for sleep. 30 tablet 0  . EUTHYROX 137 MCG tablet Take 1 tablet by mouth once daily 30 tablet 0  . diazepam (VALIUM) 5 MG tablet Take 1 tablet (5 mg total) by mouth every 12 (twelve) hours as needed for anxiety. (Patient not taking: Reported on 04/13/2020) 10 tablet 0   No facility-administered medications prior to visit.    Allergies  Allergen Reactions  . No Known Allergies     ROS Review of Systems  Constitutional: Negative for chills and fever.  HENT: Positive for ear pain. Negative for congestion, sinus pressure, sore throat and trouble swallowing.   Eyes: Negative.   Respiratory: Negative for cough and shortness of breath.   Cardiovascular: Negative for chest pain.  Endocrine: Negative.   Genitourinary: Negative.   Musculoskeletal: Negative.   Skin: Negative.   Allergic/Immunologic: Negative.   Neurological: Negative.   Psychiatric/Behavioral: Positive for sleep disturbance. Negative for dysphoric mood, self-injury and suicidal ideas. The patient is not nervous/anxious.       Objective:    Physical Exam Vitals and nursing note reviewed.  Constitutional:      Appearance: Normal appearance.  HENT:     Head: Normocephalic and atraumatic.     Right Ear: Tympanic membrane, ear canal and external ear normal.     Left Ear: Decreased hearing noted. Tenderness present. There is no impacted cerumen. Tympanic membrane is injected and erythematous.     Nose: Nose normal.     Mouth/Throat:     Lips: Pink.     Mouth: Mucous membranes are moist.      Pharynx: Oropharynx is clear.     Tonsils: No tonsillar exudate or tonsillar abscesses. 1+ on the right. 1+ on the left.  Eyes:     Extraocular Movements: Extraocular movements intact.     Pupils: Pupils are equal, round, and reactive to light.  Cardiovascular:     Rate and Rhythm: Normal rate and regular rhythm.     Pulses: Normal pulses.     Heart sounds: Normal heart sounds.  Pulmonary:     Effort: Pulmonary effort is normal.     Breath sounds: Normal breath sounds.  Musculoskeletal:        General: Normal range of motion.     Cervical  back: Normal range of motion and neck supple.  Skin:    General: Skin is warm.  Neurological:     General: No focal deficit present.     Mental Status: She is alert and oriented to person, place, and time.  Psychiatric:        Mood and Affect: Mood normal.        Behavior: Behavior normal.        Thought Content: Thought content normal.        Judgment: Judgment normal.     BP 130/88 (BP Location: Right Arm, Patient Position: Sitting, Cuff Size: Large)   Pulse 94   Temp 98.2 F (36.8 C) (Oral)   Resp 18   Ht 5\' 4"  (1.626 m)   Wt 256 lb (116.1 kg)   LMP 06/24/2019   SpO2 97%   BMI 43.94 kg/m  Wt Readings from Last 3 Encounters:  04/13/20 256 lb (116.1 kg)  04/07/20 253 lb (114.8 kg)  03/16/20 253 lb (114.8 kg)     There are no preventive care reminders to display for this patient.  There are no preventive care reminders to display for this patient.  Lab Results  Component Value Date   TSH 0.793 10/23/2019   Lab Results  Component Value Date   WBC 13.6 (H) 01/13/2020   HGB 13.0 01/13/2020   HCT 38.8 01/13/2020   MCV 80.0 01/13/2020   PLT 303 01/13/2020   Lab Results  Component Value Date   NA 137 01/13/2020   K 3.8 01/13/2020   CO2 24 01/13/2020   GLUCOSE 91 01/13/2020   BUN 8 01/13/2020   CREATININE 0.83 01/13/2020   BILITOT 0.7 01/13/2020   ALKPHOS 82 01/13/2020   AST 13 (L) 01/13/2020   ALT 15 01/13/2020    PROT 7.4 01/13/2020   ALBUMIN 3.5 01/13/2020   CALCIUM 8.8 (L) 01/13/2020   ANIONGAP 7 01/13/2020   Lab Results  Component Value Date   CHOL 170 08/01/2018   Lab Results  Component Value Date   HDL 62 08/01/2018   Lab Results  Component Value Date   LDLCALC 90 08/01/2018   Lab Results  Component Value Date   TRIG 89 08/01/2018   Lab Results  Component Value Date   CHOLHDL 2.7 08/01/2018   Lab Results  Component Value Date   HGBA1C 5.5 03/14/2018      Assessment & Plan:   Problem List Items Addressed This Visit      Endocrine   Hypothyroidism due to Hashimoto's thyroiditis   Relevant Medications   levothyroxine (EUTHYROX) 137 MCG tablet     Other   Vitamin D deficiency    Other Visit Diagnoses    Acute otitis media, unspecified otitis media type    -  Primary   Relevant Medications   amoxicillin-clavulanate (AUGMENTIN) 875-125 MG tablet   Insomnia, unspecified type       Relevant Medications   traZODone (DESYREL) 50 MG tablet     1. Acute otitis media, unspecified otitis media type Trial of Augmentin, patient takes 100 mg of doxycycline on a daily basis, encourage patient to pause that medication while she is taking Augmentin.  Encouraged Zyrtec over-the-counter, increase hydration, plenty of rest. - amoxicillin-clavulanate (AUGMENTIN) 875-125 MG tablet; Take 1 tablet by mouth 2 (two) times daily.  Dispense: 20 tablet; Refill: 0  2. Insomnia, unspecified type Trial trazodone, patient education given on good sleep hygiene - traZODone (DESYREL) 50 MG tablet; Take 0.5-1 tablets (25-50 mg  total) by mouth at bedtime as needed for sleep.  Dispense: 30 tablet; Refill: 3  3. Hypothyroidism due to Hashimoto's thyroiditis Refilled for patient - levothyroxine (EUTHYROX) 137 MCG tablet; Take 1 tablet (137 mcg total) by mouth daily.  Dispense: 30 tablet; Refill: 2  4. Vitamin D deficiency Encouraged patient to take 2000 units over-the-counter on a daily  basis.  Meds ordered this encounter  Medications  . traZODone (DESYREL) 50 MG tablet    Sig: Take 0.5-1 tablets (25-50 mg total) by mouth at bedtime as needed for sleep.    Dispense:  30 tablet    Refill:  3    Order Specific Question:   Supervising Provider    Answer:   Joya Gaskins, PATRICK E [[id number]]  . amoxicillin-clavulanate (AUGMENTIN) 875-125 MG tablet    Sig: Take 1 tablet by mouth 2 (two) times daily.    Dispense:  20 tablet    Refill:  0    Order Specific Question:   Supervising Provider    Answer:   Asencion Noble E [[id number]]  . levothyroxine (EUTHYROX) 137 MCG tablet    Sig: Take 1 tablet (137 mcg total) by mouth daily.    Dispense:  30 tablet    Refill:  2    Order Specific Question:   Supervising Provider    Answer:   Elsie Stain [[id number]]    I have reviewed the patient's medical history (PMH, PSH, Social History, Family History, Medications, and allergies) , and have been updated if relevant. I spent 20 minutes reviewing chart and  face to face time with patient.    Follow-up: Return if symptoms worsen or fail to improve.    Loraine Grip Mayers, PA-C

## 2020-04-13 NOTE — Progress Notes (Signed)
Patient has had coffee with cream and sugar and has taken medication today. Patient complains of left ear pain beginning last week and described as aching and muffled. Pain is scaled at a 6 currently. Patient used hot showers with no relief.

## 2020-04-14 DIAGNOSIS — G4700 Insomnia, unspecified: Secondary | ICD-10-CM | POA: Insufficient documentation

## 2020-04-15 ENCOUNTER — Inpatient Hospital Stay: Payer: 59 | Attending: Hematology and Oncology | Admitting: Hematology and Oncology

## 2020-04-15 ENCOUNTER — Other Ambulatory Visit: Payer: Self-pay | Admitting: Hematology and Oncology

## 2020-04-15 ENCOUNTER — Other Ambulatory Visit: Payer: Self-pay

## 2020-04-15 ENCOUNTER — Inpatient Hospital Stay: Payer: 59

## 2020-04-15 VITALS — BP 130/84 | HR 76 | Temp 97.5°F | Resp 18 | Ht 64.0 in | Wt 261.3 lb

## 2020-04-15 DIAGNOSIS — D500 Iron deficiency anemia secondary to blood loss (chronic): Secondary | ICD-10-CM

## 2020-04-15 DIAGNOSIS — Z79899 Other long term (current) drug therapy: Secondary | ICD-10-CM | POA: Insufficient documentation

## 2020-04-15 DIAGNOSIS — G4733 Obstructive sleep apnea (adult) (pediatric): Secondary | ICD-10-CM | POA: Diagnosis not present

## 2020-04-15 DIAGNOSIS — Z87891 Personal history of nicotine dependence: Secondary | ICD-10-CM | POA: Insufficient documentation

## 2020-04-15 DIAGNOSIS — E063 Autoimmune thyroiditis: Secondary | ICD-10-CM | POA: Diagnosis not present

## 2020-04-15 DIAGNOSIS — D472 Monoclonal gammopathy: Secondary | ICD-10-CM

## 2020-04-15 DIAGNOSIS — Z90710 Acquired absence of both cervix and uterus: Secondary | ICD-10-CM | POA: Diagnosis not present

## 2020-04-15 DIAGNOSIS — Z9079 Acquired absence of other genital organ(s): Secondary | ICD-10-CM | POA: Diagnosis not present

## 2020-04-15 LAB — CBC WITH DIFFERENTIAL (CANCER CENTER ONLY)
Abs Immature Granulocytes: 0.02 10*3/uL (ref 0.00–0.07)
Basophils Absolute: 0 10*3/uL (ref 0.0–0.1)
Basophils Relative: 0 %
Eosinophils Absolute: 0.2 10*3/uL (ref 0.0–0.5)
Eosinophils Relative: 2 %
HCT: 37.9 % (ref 36.0–46.0)
Hemoglobin: 12.7 g/dL (ref 12.0–15.0)
Immature Granulocytes: 0 %
Lymphocytes Relative: 45 %
Lymphs Abs: 3.5 10*3/uL (ref 0.7–4.0)
MCH: 27.4 pg (ref 26.0–34.0)
MCHC: 33.5 g/dL (ref 30.0–36.0)
MCV: 81.9 fL (ref 80.0–100.0)
Monocytes Absolute: 0.7 10*3/uL (ref 0.1–1.0)
Monocytes Relative: 9 %
Neutro Abs: 3.5 10*3/uL (ref 1.7–7.7)
Neutrophils Relative %: 44 %
Platelet Count: 241 10*3/uL (ref 150–400)
RBC: 4.63 MIL/uL (ref 3.87–5.11)
RDW: 13.4 % (ref 11.5–15.5)
WBC Count: 7.8 10*3/uL (ref 4.0–10.5)
nRBC: 0 % (ref 0.0–0.2)

## 2020-04-15 LAB — CMP (CANCER CENTER ONLY)
ALT: 18 U/L (ref 0–44)
AST: 18 U/L (ref 15–41)
Albumin: 3.6 g/dL (ref 3.5–5.0)
Alkaline Phosphatase: 78 U/L (ref 38–126)
Anion gap: 9 (ref 5–15)
BUN: 7 mg/dL (ref 6–20)
CO2: 24 mmol/L (ref 22–32)
Calcium: 8.8 mg/dL — ABNORMAL LOW (ref 8.9–10.3)
Chloride: 105 mmol/L (ref 98–111)
Creatinine: 0.79 mg/dL (ref 0.44–1.00)
GFR, Estimated: >60 mL/min (ref >60)
Glucose, Bld: 94 mg/dL (ref 70–99)
Potassium: 4.1 mmol/L (ref 3.5–5.1)
Sodium: 138 mmol/L (ref 135–145)
Total Bilirubin: 0.7 mg/dL (ref 0.3–1.2)
Total Protein: 7.4 g/dL (ref 6.5–8.1)

## 2020-04-15 LAB — LACTATE DEHYDROGENASE: LDH: 173 U/L (ref 98–192)

## 2020-04-16 LAB — KAPPA/LAMBDA LIGHT CHAINS
Kappa free light chain: 21.8 mg/L — ABNORMAL HIGH (ref 3.3–19.4)
Kappa, lambda light chain ratio: 1.17 (ref 0.26–1.65)
Lambda free light chains: 18.7 mg/L (ref 5.7–26.3)

## 2020-04-18 DIAGNOSIS — G4733 Obstructive sleep apnea (adult) (pediatric): Secondary | ICD-10-CM | POA: Diagnosis not present

## 2020-04-18 DIAGNOSIS — R0681 Apnea, not elsewhere classified: Secondary | ICD-10-CM | POA: Diagnosis not present

## 2020-04-18 NOTE — Procedures (Signed)
    Patient Name: Heather Campos, Heather Campos Date: 04/07/2020 Gender: Female D.O.B: [birth year] Age (years): 45 Referring Provider: Melina Schools DO Height (inches): 77 Interpreting Physician: Baird Lyons MD, ABSM Weight (lbs): 253 RPSGT: Jacolyn Reedy BMI: 36 MRN: [id number] Neck Size: 16.00  CLINICAL INFORMATION Sleep Study Type: HST Indication for sleep study: OSA, Witnessed Apneas Epworth Sleepiness Score: 11  SLEEP STUDY TECHNIQUE A multi-channel overnight portable sleep study was performed. The channels recorded were: nasal airflow, thoracic respiratory movement, and oxygen saturation with a pulse oximetry. Snoring was also monitored.  MEDICATIONS Patient self administered medications include: none reported  SLEEP ARCHITECTURE Patient was studied for 311 minutes. The sleep efficiency was 86.3 % and the patient was supine for 73.8%. The arousal index was 0.0 per hour.  RESPIRATORY PARAMETERS The overall AHI was 22.8 per hour, with a central apnea index of 0.0 per hour. The oxygen nadir was 77% during sleep.  CARDIAC DATA Mean heart rate during sleep was 101.9 bpm.  IMPRESSIONS - Moderate obstructive sleep apnea occurred during this study (AHI = 22.8/h). - No significant central sleep apnea occurred during this study (CAI = 0.0/h). - Oxygen desaturation was noted during this study (Min O2 = 77%). Mean O2 saturation 94%. - Patient snored.  DIAGNOSIS - Obstructive Sleep Apnea (G47.33)  RECOMMENDATIONS - Suggest CPAP titration sleep study or autopap. Other options would be based on clinical judgment. - Be careful with alcohol, sedatives and other CNS depressants that may worsen sleep apnea and disrupt normal sleep architecture. - Sleep hygiene should be reviewed to assess factors that may improve sleep quality. - Weight management and regular exercise should be initiated or continued.  [Electronically signed] 04/18/2020 11:01 AM  Baird Lyons MD,  ABSM Diplomate, American Board of Sleep Medicine   NPI: [id number]                         [place] [place]  ELECTRONICALLY SIGNED ON:  04/18/2020, 10:59 AM [place] PH: [phone]   FX: [phone] [place]

## 2020-04-20 ENCOUNTER — Encounter: Payer: Self-pay | Admitting: Hematology and Oncology

## 2020-04-20 LAB — MULTIPLE MYELOMA PANEL, SERUM
Albumin SerPl Elph-Mcnc: 3.3 g/dL (ref 2.9–4.4)
Albumin/Glob SerPl: 1 (ref 0.7–1.7)
Alpha 1: 0.2 g/dL (ref 0.0–0.4)
Alpha2 Glob SerPl Elph-Mcnc: 0.5 g/dL (ref 0.4–1.0)
B-Globulin SerPl Elph-Mcnc: 1.1 g/dL (ref 0.7–1.3)
Gamma Glob SerPl Elph-Mcnc: 1.5 g/dL (ref 0.4–1.8)
Globulin, Total: 3.4 g/dL (ref 2.2–3.9)
IgA: 172 mg/dL (ref 87–352)
IgG (Immunoglobin G), Serum: 1590 mg/dL (ref 586–1602)
IgM (Immunoglobulin M), Srm: 32 mg/dL (ref 26–217)
M Protein SerPl Elph-Mcnc: 0.5 g/dL — ABNORMAL HIGH
Total Protein ELP: 6.7 g/dL (ref 6.0–8.5)

## 2020-04-21 ENCOUNTER — Encounter: Payer: Self-pay | Admitting: Hematology and Oncology

## 2020-04-21 NOTE — Progress Notes (Signed)
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  2) 01/13/2020: establish care with Dr. Lorenso Courier. Kappa 18.3, Lambda 15.8, Ratio 1.16. M protein 0.6 3) 04/15/2020: M protein 0.5, Lambda 18.7, Kappa 21.8, ratio 1.17  Interval History:  Heather Campos 45 y.o. female with medical history significant for IgG Lambda MGUS who presents for a follow up visit. The patient's last visit was on 01/12/2021 at which time she established care. In the interim since the last visit she has had no major changes in her health.   On exam today Mrs. Starn reports that she has had no major changes in her health.  She recently had an ear infection for which she was taking antibiotics.  She was also been having some trouble sleeping and has been taking trazodone for this.  She reports that she has had some issues with numbness in her fingers but otherwise "cannot complain".  She reports that her ear is feeling better after the antibiotic therapy.  She otherwise denies any fevers, chills, sweats, nausea, vomiting or diarrhea.  A full 10 point ROS is listed below.  MEDICAL HISTORY:  Past Medical History:  Diagnosis Date  . Allergic rhinitis   . Anxiety   . Chronic back pain   . Family history of systemic lupus erythematosus   . GERD (gastroesophageal reflux disease)   . Hypothyroidism due to Hashimoto's thyroiditis   . IDA (iron deficiency anemia)   . Morbid obesity ([place])   . OSA (obstructive sleep apnea)    No CPAP  . Vitamin D deficiency     SURGICAL HISTORY: Past Surgical History:  Procedure Laterality Date  . ABDOMINAL HYSTERECTOMY    . CESAREAN SECTION    . CHOLECYSTECTOMY    . CYSTOSCOPY  07/04/2019    Procedure: CYSTOSCOPY;  Surgeon: Sanjuana Kava, MD;  Location: [facility];  Service: Gynecology;;  . HYSTERECTOMY ABDOMINAL WITH SALPINGECTOMY Bilateral 07/04/2019   Procedure: ABDOMINAL  suprcervical hysterctomyWITH SALPINGECTOMY ;  Surgeon: Sanjuana Kava, MD;  Location: [place];  Service: Gynecology;  Laterality: Bilateral;  . TOTAL LAPAROSCOPIC HYSTERECTOMY WITH SALPINGECTOMY Bilateral 07/04/2019   Procedure: attemted TOTAL LAPAROSCOPIC HYSTERECTOMY WITH SALPINGECTOMY;  Surgeon: Sanjuana Kava, MD;  Location: [place];  Service: Gynecology;  Laterality: Bilateral;  3 hours    SOCIAL HISTORY: Social History   Socioeconomic History  . Marital status: Married    Spouse name: Not on file  . Number of children: Not on file  . Years of education: Not on file  . Highest education level: Not on file  Occupational History  . Not on file  Tobacco Use  . Smoking status: Former Research scientist (life sciences)  . Smokeless tobacco: Never Used  Vaping Use  . Vaping Use: Never used  Substance and Sexual Activity  . Alcohol use: No  . Drug use: No  . Sexual activity: Yes    Birth control/protection: None  Other Topics Concern  . Not on file  Social History Narrative  . Not on file   Social Determinants of Health   Financial Resource Strain: Not on file  Food Insecurity: Not on file  Transportation Needs: Not on file  Physical Activity: Not on file  Stress: Not on file  Social Connections: Not on  file  Intimate Partner Violence: Not on file    FAMILY HISTORY: Family History  Problem Relation Age of Onset  . Lupus Mother   . Kidney disease Mother   . Lupus Cousin     ALLERGIES:  is allergic to no known allergies.  MEDICATIONS:  Current Outpatient Medications  Medication Sig Dispense Refill  . amoxicillin-clavulanate (AUGMENTIN) 875-125 MG tablet Take 1 tablet by mouth 2 (two) times daily. 20 tablet 0  . clindamycin (CLEOCIN T) 1 % lotion Apply 1  application topically daily.     . diazepam (VALIUM) 5 MG tablet Take 1 tablet (5 mg total) by mouth every 12 (twelve) hours as needed for anxiety. (Patient not taking: Reported on 04/13/2020) 10 tablet 0  . doxycycline (ADOXA) 100 MG tablet Take 100 mg by mouth daily.    . folic acid (FOLVITE) 1 MG tablet Take 1 mg by mouth daily.    Marland Kitchen gabapentin (NEURONTIN) 300 MG capsule Take 300 mg by mouth daily.    Marland Kitchen HYDROcodone-acetaminophen (NORCO) 10-325 MG tablet Take 1 tablet by mouth every 8 (eight) hours as needed.    Marland Kitchen ibuprofen (ADVIL) 800 MG tablet Take 1 tablet (800 mg total) by mouth 3 (three) times daily. 21 tablet 0  . levothyroxine (EUTHYROX) 137 MCG tablet Take 1 tablet (137 mcg total) by mouth daily. 30 tablet 2  . methocarbamol (ROBAXIN-750) 750 MG tablet Take 1 tablet (750 mg total) by mouth every 8 (eight) hours as needed for muscle spasms. 30 tablet 0  . omeprazole (PRILOSEC) 40 MG capsule Take 1 capsule (40 mg total) by mouth daily as needed. 90 capsule 1  . traZODone (DESYREL) 50 MG tablet Take 0.5-1 tablets (25-50 mg total) by mouth at bedtime as needed for sleep. 30 tablet 3  . Vitamin D, Ergocalciferol, (DRISDOL) 1.25 MG (50000 UNIT) CAPS capsule Take 1 capsule (50,000 Units total) by mouth every 7 (seven) days. 4 capsule 2   No current facility-administered medications for this visit.    REVIEW OF SYSTEMS:   Constitutional: ( - ) fevers, ( - )  chills , ( - ) night sweats Eyes: ( - ) blurriness of vision, ( - ) double vision, ( - ) watery eyes Ears, nose, mouth, throat, and face: ( - ) mucositis, ( - ) sore throat Respiratory: ( - ) cough, ( - ) dyspnea, ( - ) wheezes Cardiovascular: ( - ) palpitation, ( - ) chest discomfort, ( - ) lower extremity swelling Gastrointestinal:  ( - ) nausea, ( - ) heartburn, ( - ) change in bowel habits Skin: ( - ) abnormal skin rashes Lymphatics: ( - ) new lymphadenopathy, ( - ) easy bruising Neurological: ( - ) numbness, ( - ) tingling, ( - )  new weaknesses Behavioral/Psych: ( - ) mood change, ( - ) new changes  All other systems were reviewed with the patient and are negative.  PHYSICAL EXAMINATION:  Vitals:   04/15/20 1020  BP: 130/84  Pulse: 76  Resp: 18  Temp: (!) 97.5 F (36.4 C)  SpO2: 100%   Filed Weights   04/15/20 1020  Weight: 261 lb 4.8 oz (118.5 kg)    GENERAL: well appearing middle aged [place] American female alert, no distress and comfortable SKIN: skin color, texture, turgor are normal, no rashes or significant lesions EYES: conjunctiva are pink and non-injected, sclera clear LUNGS: clear to auscultation and percussion with normal breathing effort HEART: regular rate & rhythm and no murmurs and no  lower extremity edema Musculoskeletal: no cyanosis of digits and no clubbing  PSYCH: alert & oriented x 3, fluent speech NEURO: no focal motor/sensory deficits  LABORATORY DATA:  I have reviewed the data as listed CBC Latest Ref Rng & Units 04/15/2020 01/13/2020 11/30/2019  WBC 4.0 - 10.5 K/uL 7.8 13.6(H) 10.0  Hemoglobin 12.0 - 15.0 g/dL 12.7 13.0 13.5  Hematocrit 36.0 - 46.0 % 37.9 38.8 41.4  Platelets 150 - 400 K/uL 241 303 279    CMP Latest Ref Rng & Units 04/15/2020 01/13/2020 12/26/2019  Glucose 70 - 99 mg/dL 94 91 95  BUN 6 - 20 mg/dL '7 8 8  ' Creatinine 0.44 - 1.00 mg/dL 0.79 0.83 0.73  Sodium 135 - 145 mmol/L 138 137 138  Potassium 3.5 - 5.1 mmol/L 4.1 3.8 4.6  Chloride 98 - 111 mmol/L 105 106 100  CO2 22 - 32 mmol/L '24 24 24  ' Calcium 8.9 - 10.3 mg/dL 8.8(L) 8.8(L) 9.5  Total Protein 6.5 - 8.1 g/dL 7.4 7.4 -  Total Bilirubin 0.3 - 1.2 mg/dL 0.7 0.7 -  Alkaline Phos 38 - 126 U/L 78 82 -  AST 15 - 41 U/L 18 13(L) -  ALT 0 - 44 U/L 18 15 -    Lab Results  Component Value Date   MPROTEIN 0.5 (H) 04/15/2020   MPROTEIN 0.6 (H) 01/13/2020   Lab Results  Component Value Date   KPAFRELGTCHN 21.8 (H) 04/15/2020   KPAFRELGTCHN 18.3 01/13/2020   LAMBDASER 18.7 04/15/2020   LAMBDASER 15.8  01/13/2020   KAPLAMBRATIO 1.17 04/15/2020   KAPLAMBRATIO 6.73 01/21/2020   KAPLAMBRATIO 1.16 01/13/2020    RADIOGRAPHIC STUDIES: Home sleep test  Result Date: 04/07/2020 Deneise Lever, MD     04/18/2020 11:04 AM Patient Name: Heather Campos Study Date: 04/07/2020 Gender: Female D.O.B: 07-27-1975 Age (years): 45 Referring Provider: Melina Schools DO Height (inches): 26 Interpreting Physician: Baird Lyons MD, ABSM Weight (lbs): 253 RPSGT: Jacolyn Reedy BMI: 91 MRN: [id number] Neck Size: 16.00 CLINICAL INFORMATION Sleep Study Type: HST Indication for sleep study: OSA, Witnessed Apneas Epworth Sleepiness Score: 11 SLEEP STUDY TECHNIQUE A multi-channel overnight portable sleep study was performed. The channels recorded were: nasal airflow, thoracic respiratory movement, and oxygen saturation with a pulse oximetry. Snoring was also monitored. MEDICATIONS Patient self administered medications include: none reported SLEEP ARCHITECTURE Patient was studied for 311 minutes. The sleep efficiency was 86.3 % and the patient was supine for 73.8%. The arousal index was 0.0 per hour. RESPIRATORY PARAMETERS The overall AHI was 22.8 per hour, with a central apnea index of 0.0 per hour. The oxygen nadir was 77% during sleep. CARDIAC DATA Mean heart rate during sleep was 101.9 bpm. IMPRESSIONS - Moderate obstructive sleep apnea occurred during this study (AHI = 22.8/h). - No significant central sleep apnea occurred during this study (CAI = 0.0/h). - Oxygen desaturation was noted during this study (Min O2 = 77%). Mean O2 saturation 94%. - Patient snored. DIAGNOSIS - Obstructive Sleep Apnea (G47.33) RECOMMENDATIONS - Suggest CPAP titration sleep study or autopap. Other options would be based on clinical judgment. - Be careful with alcohol, sedatives and other CNS depressants that may worsen sleep apnea and disrupt normal sleep architecture. - Sleep hygiene should be reviewed to assess factors that may improve sleep  quality. - Weight management and regular exercise should be initiated or continued. [Electronically signed] 04/18/2020 11:01 AM Baird Lyons MD, ABSM Diplomate, American Board of Sleep Medicine NPI: [id number]  [place] [place]  of Sleep Medicine ELECTRONICALLY SIGNED ON:  04/18/2020, 10:59 AM [place] PH: [phone]   FX: [phone]) [facility] MEDICINE  SLEEP STUDY DOCUMENTS  Result Date: 04/21/2020 Ordered by an unspecified provider.   ASSESSMENT & PLAN Kristol Almanzar 45 y.o. female with medical history significant for IgG Lambda MGUS who presents for a follow up visit.   After review the labs, the records, discussion with the patient the findings most consistent with an IgG lambda monoclonal gammopathy of undetermined significance.  The patient does not have any of the high risk features that would prompt [place] to perform a bone marrow biopsy.  She has an M protein less than 1.5 which is of IgG specificity.  Her kappa and lambda light chains are also within normal limits with a normal ratio.  Given these findings I would recommend routine follow-up in approximately 6 months time.  The only missing piece of her work-up at this time is a metastatic bone survey.  We have ordered this again today and will assure it is checked yearly while she is under our care.   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 This will need to be repeated yearly --assure yearly UPEP (next due 01/20/2021)  -- No indication for a bone marrow biopsy at this time.  --RTC in 3 months or sooner if indicated by the above labs.   No orders of the defined types were placed in this encounter.   All questions were answered. The patient knows to call the clinic with any problems, questions  or concerns.  A total of more than 30 minutes were spent on this encounter and over half of that time was spent on counseling and coordination of care as outlined above.   Ledell Peoples, MD Department of Hematology/Oncology [place] at [facility] Phone: [phone] Pager: [phone] Email: [email or web address]  04/21/2020 2:15 PM

## 2020-04-22 ENCOUNTER — Telehealth: Payer: Self-pay | Admitting: Hematology and Oncology

## 2020-04-22 ENCOUNTER — Telehealth: Payer: Self-pay | Admitting: *Deleted

## 2020-04-22 ENCOUNTER — Telehealth: Payer: Self-pay | Admitting: Internal Medicine

## 2020-04-22 NOTE — Telephone Encounter (Signed)
Scheduled appointments per 03/01 schedule message. Contacted patient, patient is aware.  

## 2020-04-22 NOTE — Telephone Encounter (Signed)
-----   Message from Orson Slick, MD sent at 04/21/2020  2:32 PM EST ----- Please call Heather Campos to let her know that her labs show stable M protein and serum free light chains. Her kappa elevated just slightly from last time, no need to be concerning. Given these findings I would recommend a follow up visit in 6 months.  Also please remind her that we need a metastatic survey to look at bone lesions. This is ordered but has not been completed.   ----- Message ----- From: Buel Ream, [facility] [place] Sent: 04/15/2020  10:23 AM EST To: Orson Slick, MD

## 2020-04-22 NOTE — Telephone Encounter (Signed)
TCT patient regarding recent lab results. Spoke with her and advised that her M protein are stable. She has a very small elevation in kappa light chains but Dr. Lorenso Courier indicates that there is nor need for concern. Heather Campos still needs to have a metastatic bone survey which was ordered in December. Provided phone # to [facility] Radiology Scheduling so she can schedule it according to her schedule.

## 2020-04-22 NOTE — Telephone Encounter (Signed)
Pt needs note stating she does need a sleeping machine to obtain this. Pt has questions about results and has been waiting for a call for further information. Please advise and thank you.

## 2020-04-22 NOTE — Telephone Encounter (Signed)
Pt has sent multiple messages re: sleep study results, PCP was referring provider, pls review and advise, thanks

## 2020-04-28 ENCOUNTER — Telehealth: Payer: Self-pay | Admitting: Internal Medicine

## 2020-04-28 ENCOUNTER — Other Ambulatory Visit: Payer: Self-pay | Admitting: Internal Medicine

## 2020-04-28 DIAGNOSIS — G4733 Obstructive sleep apnea (adult) (pediatric): Secondary | ICD-10-CM

## 2020-04-28 NOTE — Telephone Encounter (Signed)
ATC pt for follow-up, no answer, LM to [facility]

## 2020-04-28 NOTE — Telephone Encounter (Signed)
Pt came by for results of sleep study and next steps for machine for sleeping at night. Please advise.  Pt ph [phone]

## 2020-04-29 NOTE — Telephone Encounter (Signed)
Pt contacted re: results, order/demo/sleep study results sent to [facility] for CPAP/supplies

## 2020-04-30 ENCOUNTER — Other Ambulatory Visit: Payer: Self-pay

## 2020-04-30 ENCOUNTER — Ambulatory Visit: Payer: 59 | Admitting: Physician Assistant

## 2020-04-30 DIAGNOSIS — G4700 Insomnia, unspecified: Secondary | ICD-10-CM | POA: Diagnosis not present

## 2020-04-30 DIAGNOSIS — H6690 Otitis media, unspecified, unspecified ear: Secondary | ICD-10-CM | POA: Diagnosis not present

## 2020-04-30 MED ORDER — TRAZODONE HCL 50 MG PO TABS: 50.0000 mg | ORAL_TABLET | Freq: Every evening | ORAL | 1 refills | Status: DC | PRN

## 2020-04-30 MED ORDER — AMOXICILLIN-POT CLAVULANATE 875-125 MG PO TABS: 1.0000 | ORAL_TABLET | Freq: Two times a day (BID) | ORAL | 0 refills | Status: DC

## 2020-04-30 NOTE — Progress Notes (Signed)
Patient has taken medication today and patient has eaten today. Patient reports muffled ear sensation returning 6 days ago after completing a round of antibiotics for the same concern a few months ago. Patient denies bleeding or discharge from the ear. Patient request a 90 day supply of medications today due to beginning a training 4/10 for 6 weeks that will be out of [place].

## 2020-04-30 NOTE — Patient Instructions (Addendum)
You will take another round of antibiotics, and we scheduled you an appointment [facility].  I sent a 90-day refill of your trazodone.  Congratulations on your new position, we are all very happy for you!  Kennieth Rad, PA-C Physician Assistant [facility] [email or web address]   Health Maintenance, Female Adopting a healthy lifestyle and getting preventive care are important in promoting health and wellness. Ask your health care provider about:  The right schedule for you to have regular tests and exams.  Things you can do on your own to prevent diseases and keep yourself healthy. What should I know about diet, weight, and exercise? Eat a healthy diet  Eat a diet that includes plenty of vegetables, fruits, low-fat dairy products, and lean protein.  Do not eat a lot of foods that are high in solid fats, added sugars, or sodium.   Maintain a healthy weight Body mass index (BMI) is used to identify weight problems. It estimates body fat based on height and weight. Your health care provider can help determine your BMI and help you achieve or maintain a healthy weight. Get regular exercise Get regular exercise. This is one of the most important things you can do for your health. Most adults should:  Exercise for at least 150 minutes each week. The exercise should increase your heart rate and make you sweat (moderate-intensity exercise).  Do strengthening exercises at least twice a week. This is in addition to the moderate-intensity exercise.  Spend less time sitting. Even light physical activity can be beneficial. Watch cholesterol and blood lipids Have your blood tested for lipids and cholesterol at 43 years of age, then have this test every 5 years. Have your cholesterol levels checked more often if:  Your lipid or cholesterol levels are high.  You  are older than 43 years of age.  You are at high risk for heart disease. What should I know about cancer screening? Depending on your health history and family history, you may need to have cancer screening at various ages. This may include screening for:  Breast cancer.  Cervical cancer.  Colorectal cancer.  Skin cancer.  Lung cancer. What should I know about heart disease, diabetes, and high blood pressure? Blood pressure and heart disease  High blood pressure causes heart disease and increases the risk of stroke. This is more likely to develop in people who have high blood pressure readings, are of African descent, or are overweight.  Have your blood pressure checked: ? Every 3-5 years if you are 28-21 years of age. ? Every year if you are 48 years old or older. Diabetes Have regular diabetes screenings. This checks your fasting blood sugar level. Have the screening done:  Once every three years after age 33 if you are at a normal weight and have a low risk for diabetes.  More often and at a younger age if you are overweight or have a high risk for diabetes. What should I know about preventing infection? Hepatitis B If you have a higher risk for hepatitis B, you should be screened for this virus. Talk with your health care provider to find out if you are at risk for hepatitis B infection. Hepatitis C Testing is recommended for:  Everyone born from [birth year] through 1963.  Anyone with known risk factors for hepatitis C. Sexually transmitted infections (STIs)  Get screened for STIs, including gonorrhea and chlamydia, if: ?  You are sexually active and are younger than 43 years of age. ? You are older than 43 years of age and your health care provider tells you that you are at risk for this type of infection. ? Your sexual activity has changed since you were last screened, and you are at increased risk for chlamydia or gonorrhea. Ask your health care provider if you are at  risk.  Ask your health care provider about whether you are at high risk for HIV. Your health care provider may recommend a prescription medicine to help prevent HIV infection. If you choose to take medicine to prevent HIV, you should first get tested for HIV. You should then be tested every 3 months for as long as you are taking the medicine. Pregnancy  If you are about to stop having your period (premenopausal) and you may become pregnant, seek counseling before you get pregnant.  Take 400 to 800 micrograms (mcg) of folic acid every day if you become pregnant.  Ask for birth control (contraception) if you want to prevent pregnancy. Osteoporosis and menopause Osteoporosis is a disease in which the bones lose minerals and strength with aging. This can result in bone fractures. If you are 27 years old or older, or if you are at risk for osteoporosis and fractures, ask your health care provider if you should:  Be screened for bone loss.  Take a calcium or vitamin D supplement to lower your risk of fractures.  Be given hormone replacement therapy (HRT) to treat symptoms of menopause. Follow these instructions at home: Lifestyle  Do not use any products that contain nicotine or tobacco, such as cigarettes, e-cigarettes, and chewing tobacco. If you need help quitting, ask your health care provider.  Do not use street drugs.  Do not share needles.  Ask your health care provider for help if you need support or information about quitting drugs. Alcohol use  Do not drink alcohol if: ? Your health care provider tells you not to drink. ? You are pregnant, may be pregnant, or are planning to become pregnant.  If you drink alcohol: ? Limit how much you use to 0-1 drink a day. ? Limit intake if you are breastfeeding.  Be aware of how much alcohol is in your drink. In the [place] one drink equals one 12 oz bottle of beer (355 mL), one 5 oz glass of wine (148 mL), or one 1 oz glass of hard liquor  (44 mL). General instructions  Schedule regular health, dental, and eye exams.  Stay current with your vaccines.  Tell your health care provider if: ? You often feel depressed. ? You have ever been abused or do not feel safe at home. Summary  Adopting a healthy lifestyle and getting preventive care are important in promoting health and wellness.  Follow your health care provider's instructions about healthy diet, exercising, and getting tested or screened for diseases.  Follow your health care provider's instructions on monitoring your cholesterol and blood pressure. This information is not intended to replace advice given to you by your health care provider. Make sure you discuss any questions you have with your health care provider. Document Revised: 01/31/2018 Document Reviewed: 01/31/2018 Elsevier Patient Education  2021 [employer].

## 2020-04-30 NOTE — Progress Notes (Signed)
Established Patient Office Visit  Subjective:  Patient ID: Heather Campos, female    DOB: 1975-12-05  Age: 45 y.o. MRN: [id number]  CC:  Chief Complaint  Patient presents with  . Ear Fullness    Left    HPI Heather Campos reports that her left ear pain and muffled hearing has returned, states it has been present for approximately the last week.  Reports that she did complete Augmentin that was prescribed after her last visit to the mobile medicine unit for the same complaint, states that it did improve for a short time.  At last office visit she also complained of swollen tonsil and sore throat, that complaint is not present at this time.  States that she did fly on an airplane in January, is unsure if that is related to this, states that she did notice increased pressure in her left ear during the flight and harder to make the pressure release.  Requests a refill on trazodone, states that she is using 50 mg with relief of insomnia.  Denies any adverse effects.  States that she is very excited for a new position, she will be working from home with [employer].  Reports her start date is in April and will be away from home for 6 weeks for pain, requested 90-day refill of the trazodone.   Past Medical History:  Diagnosis Date  . Allergic rhinitis   . Anxiety   . Chronic back pain   . Family history of systemic lupus erythematosus   . GERD (gastroesophageal reflux disease)   . Hypothyroidism due to Hashimoto's thyroiditis   . IDA (iron deficiency anemia)   . Morbid obesity ([place])   . OSA (obstructive sleep apnea)    No CPAP  . Vitamin D deficiency     Past Surgical History:  Procedure Laterality Date  . ABDOMINAL HYSTERECTOMY    . CESAREAN SECTION    . CHOLECYSTECTOMY    . CYSTOSCOPY  07/04/2019   Procedure: CYSTOSCOPY;  Surgeon: Sanjuana Kava, MD;  Location: [facility];  Service: Gynecology;;  . HYSTERECTOMY ABDOMINAL WITH SALPINGECTOMY Bilateral  07/04/2019   Procedure: ABDOMINAL  suprcervical hysterctomyWITH SALPINGECTOMY ;  Surgeon: Sanjuana Kava, MD;  Location: [place];  Service: Gynecology;  Laterality: Bilateral;  . TOTAL LAPAROSCOPIC HYSTERECTOMY WITH SALPINGECTOMY Bilateral 07/04/2019   Procedure: attemted TOTAL LAPAROSCOPIC HYSTERECTOMY WITH SALPINGECTOMY;  Surgeon: Sanjuana Kava, MD;  Location: [place];  Service: Gynecology;  Laterality: Bilateral;  3 hours    Family History  Problem Relation Age of Onset  . Lupus Mother   . Kidney disease Mother   . Lupus Cousin     Social History   Socioeconomic History  . Marital status: Married    Spouse name: Not on file  . Number of children: Not on file  . Years of education: Not on file  . Highest education level: Not on file  Occupational History  . Not on file  Tobacco Use  . Smoking status: Former Research scientist (life sciences)  . Smokeless tobacco: Never Used  Vaping Use  . Vaping Use: Never used  Substance and Sexual Activity  . Alcohol use: No  . Drug use: No  . Sexual activity: Yes    Birth control/protection: None  Other Topics Concern  . Not on file  Social History Narrative  . Not on file   Social Determinants of Health   Financial Resource Strain: Not on file  Food Insecurity: Not on file  Transportation  Needs: Not on file  Physical Activity: Not on file  Stress: Not on file  Social Connections: Not on file  Intimate Partner Violence: Not on file    Outpatient Medications Prior to Visit  Medication Sig Dispense Refill  . clindamycin (CLEOCIN T) 1 % lotion Apply 1 application topically daily.     Marland Kitchen doxycycline (ADOXA) 100 MG tablet Take 100 mg by mouth daily.    Marland Kitchen gabapentin (NEURONTIN) 300 MG capsule Take 300 mg by mouth daily.    Marland Kitchen HYDROcodone-acetaminophen (NORCO) 10-325 MG tablet Take 1 tablet by mouth every 8 (eight) hours as needed.    Marland Kitchen levothyroxine (EUTHYROX) 137 MCG tablet Take 1 tablet (137 mcg total) by mouth daily. 30  tablet 2  . omeprazole (PRILOSEC) 40 MG capsule Take 1 capsule (40 mg total) by mouth daily as needed. 90 capsule 1  . Vitamin D, Ergocalciferol, (DRISDOL) 1.25 MG (50000 UNIT) CAPS capsule Take 1 capsule (50,000 Units total) by mouth every 7 (seven) days. 4 capsule 2  . traZODone (DESYREL) 50 MG tablet Take 0.5-1 tablets (25-50 mg total) by mouth at bedtime as needed for sleep. 30 tablet 3  . diazepam (VALIUM) 5 MG tablet Take 1 tablet (5 mg total) by mouth every 12 (twelve) hours as needed for anxiety. (Patient not taking: No sig reported) 10 tablet 0  . ibuprofen (ADVIL) 800 MG tablet Take 1 tablet (800 mg total) by mouth 3 (three) times daily. (Patient not taking: No sig reported) 21 tablet 0  . methocarbamol (ROBAXIN-750) 750 MG tablet Take 1 tablet (750 mg total) by mouth every 8 (eight) hours as needed for muscle spasms. (Patient not taking: No sig reported) 30 tablet 0  . amoxicillin-clavulanate (AUGMENTIN) 875-125 MG tablet Take 1 tablet by mouth 2 (two) times daily. 20 tablet 0  . folic acid (FOLVITE) 1 MG tablet Take 1 mg by mouth daily.     No facility-administered medications prior to visit.    Allergies  Allergen Reactions  . No Known Allergies     ROS Review of Systems  Constitutional: Negative for chills and fever.  HENT: Positive for ear pain. Negative for congestion, ear discharge, sinus pressure and sore throat.   Eyes: Negative.   Respiratory: Negative for cough.   Cardiovascular: Negative for chest pain.  Gastrointestinal: Negative.   Endocrine: Negative.   Genitourinary: Negative.   Musculoskeletal: Negative.   Skin: Negative.   Allergic/Immunologic: Negative.   Neurological: Negative for headaches.  Hematological: Negative.   Psychiatric/Behavioral: Negative.       Objective:    Physical Exam Vitals and nursing note reviewed.  Constitutional:      Appearance: Normal appearance.  HENT:     Right Ear: Hearing and tympanic membrane normal.     Left  Ear: Decreased hearing noted. Tenderness present. Tympanic membrane is injected and erythematous.     Nose: Nose normal.     Mouth/Throat:     Mouth: Mucous membranes are moist.     Pharynx: Oropharynx is clear.  Eyes:     Extraocular Movements: Extraocular movements intact.     Conjunctiva/sclera: Conjunctivae normal.     Pupils: Pupils are equal, round, and reactive to light.  Cardiovascular:     Rate and Rhythm: Normal rate and regular rhythm.     Pulses: Normal pulses.     Heart sounds: Normal heart sounds.  Pulmonary:     Effort: Pulmonary effort is normal.     Breath sounds: Normal breath sounds.  Musculoskeletal:        General: Normal range of motion.     Cervical back: Normal range of motion and neck supple.  Lymphadenopathy:     Cervical: No cervical adenopathy.  Skin:    General: Skin is warm and dry.  Neurological:     General: No focal deficit present.     Mental Status: She is alert and oriented to person, place, and time.  Psychiatric:        Mood and Affect: Mood normal.        Behavior: Behavior normal.        Thought Content: Thought content normal.        Judgment: Judgment normal.     BP 131/90 (BP Location: Left Arm, Patient Position: Sitting, Cuff Size: Large)   Pulse 90   Temp 98.5 F (36.9 C) (Oral)   Resp 18   Ht 5\' 4"  (1.626 m)   Wt 260 lb (117.9 kg)   LMP 06/24/2019   SpO2 99%   BMI 44.63 kg/m  Wt Readings from Last 3 Encounters:  05/01/20 261 lb (118.4 kg)  04/30/20 260 lb (117.9 kg)  04/15/20 261 lb 4.8 oz (118.5 kg)     There are no preventive care reminders to display for this patient.  There are no preventive care reminders to display for this patient.  Lab Results  Component Value Date   TSH 0.793 10/23/2019   Lab Results  Component Value Date   WBC 7.8 04/15/2020   HGB 12.7 04/15/2020   HCT 37.9 04/15/2020   MCV 81.9 04/15/2020   PLT 241 04/15/2020   Lab Results  Component Value Date   NA 138 04/15/2020   K 4.1  04/15/2020   CO2 24 04/15/2020   GLUCOSE 94 04/15/2020   BUN 7 04/15/2020   CREATININE 0.79 04/15/2020   BILITOT 0.7 04/15/2020   ALKPHOS 78 04/15/2020   AST 18 04/15/2020   ALT 18 04/15/2020   PROT 7.4 04/15/2020   ALBUMIN 3.6 04/15/2020   CALCIUM 8.8 (L) 04/15/2020   ANIONGAP 9 04/15/2020   Lab Results  Component Value Date   CHOL 170 08/01/2018   Lab Results  Component Value Date   HDL 62 08/01/2018   Lab Results  Component Value Date   LDLCALC 90 08/01/2018   Lab Results  Component Value Date   TRIG 89 08/01/2018   Lab Results  Component Value Date   CHOLHDL 2.7 08/01/2018   Lab Results  Component Value Date   HGBA1C 5.5 03/14/2018      Assessment & Plan:   Problem List Items Addressed This Visit      Other   Insomnia   Relevant Medications   traZODone (DESYREL) 50 MG tablet    Other Visit Diagnoses    Acute otitis media, unspecified otitis media type       Relevant Medications   amoxicillin-clavulanate (AUGMENTIN) 875-125 MG tablet    1. Acute otitis media, unspecified otitis media type Trial Augmentin, patient [facility]. - amoxicillin-clavulanate (AUGMENTIN) 875-125 MG tablet; Take 1 tablet by mouth 2 (two) times daily.  Dispense: 20 tablet; Refill: 0  2. Insomnia, unspecified type Continue current regimen - traZODone (DESYREL) 50 MG tablet; Take 1 tablet (50 mg total) by mouth at bedtime as needed for sleep.  Dispense: 90 tablet; Refill: 1    I have reviewed the patient's medical history (PMH,  PSH, Social History, Family History, Medications, and allergies) , and have been updated if relevant. I spent 30 minutes reviewing chart and  face to face time with patient.     Meds ordered this encounter  Medications  . amoxicillin-clavulanate (AUGMENTIN) 875-125 MG tablet    Sig: Take 1 tablet by mouth 2 (two) times daily.    Dispense:   20 tablet    Refill:  0    Order Specific Question:   Supervising Provider    Answer:   Asencion Noble E [[id number]]  . traZODone (DESYREL) 50 MG tablet    Sig: Take 1 tablet (50 mg total) by mouth at bedtime as needed for sleep.    Dispense:  90 tablet    Refill:  1    Order Specific Question:   Supervising Provider    Answer:   Elsie Stain [[id number]]    Follow-up: No follow-ups on file.    Loraine Grip Mayers, PA-C

## 2020-05-01 ENCOUNTER — Ambulatory Visit: Payer: 59 | Attending: Otolaryngology | Admitting: Otolaryngology

## 2020-05-01 VITALS — BP 123/81 | HR 76 | Ht 64.0 in | Wt 261.0 lb

## 2020-05-01 DIAGNOSIS — G4733 Obstructive sleep apnea (adult) (pediatric): Secondary | ICD-10-CM

## 2020-05-01 DIAGNOSIS — M26622 Arthralgia of left temporomandibular joint: Secondary | ICD-10-CM

## 2020-05-01 DIAGNOSIS — H9202 Otalgia, left ear: Secondary | ICD-10-CM | POA: Diagnosis not present

## 2020-05-01 DIAGNOSIS — H6992 Unspecified Eustachian tube disorder, left ear: Secondary | ICD-10-CM | POA: Diagnosis not present

## 2020-05-01 DIAGNOSIS — J358 Other chronic diseases of tonsils and adenoids: Secondary | ICD-10-CM

## 2020-05-01 NOTE — Patient Instructions (Signed)
I think it is possible you have some eustachian tube difficulties.  This would probably be most obvious during the descent phase of an airplane flight, and afterwards.  I recommend that you use Afrin (oxymetazoline) decongestant nasal spray, 2 puffs in each side before the plane starts to descend.  You can also pinch your nose then blow to make your ears pop.    You have a small amount of wax in your left ear canal which I do not think is causing any issues and does not necessarily need to be cleaned.  You have small tonsils with normal pockets in them.  You could be making some debris which may pop out like a small piece of yellowish cottage cheese, and very smelly.  This could give you the sensation of something stuck in your throat off and on.  Tonsillectomy would fix this problem, but I recommend against it at your age unless it is causing major problems.  You could have some arthritis in your jaw joints from chewing gum.  Ask your husband if he witnesses you grinding your teeth especially when you are asleep.  Try to take it easy on chewing gum, and mention this to your dentist if you think this is an issue.  We will send you for a proper hearing test, and a test of your eardrum pressure and will call you with these results.

## 2020-05-01 NOTE — Progress Notes (Signed)
Having pain in left ear.

## 2020-05-01 NOTE — Progress Notes (Signed)
Chief complaint: Left ear pain and pressure  History: 45 year old black female who has had some issues with pain and pressure in her left ear now for maybe several months.  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  No real change in hearing.  No drainage.  No hearing testing thus far.  She still has her tonsils.  She does not notice tonsillolith debris.  She has an occasional mild foreign body sensation in the pharynx like a toothbrush bristle.  She does chew gum occasionally.  No reported bruxism from her husband.  She has documented sleep apnea but CPAP orders pending.  Examination: She is very heavyset.  Mental status is sharp.  She hears well in conversational speech.  The head is atraumatic and neck supple.  Cranial nerves intact.  The right ear canal is clear with a normal aerated looking drum.  The left ear canal has a small amount of wax against the anterior canal wall, nonobstructing with a normal aerated appearing drum.  Anterior nose is moist and patent.  Oral cavity is very fleshy with a bulky tongue and a crowded pharynx.  She has 1-2+ tonsils with crypts but no obvious cryptic debris.  Neck without adenopathy.  No tenderness or crepitus over the TMJ although she notes a slight aggravation when I removed pressure on her left TMJ.  Impression: Possible eustachian tube dysfunction.  Possible TMJ syndrome with ear pain.  Possible tonsillolith debris with pharyngeal foreign body sensation.  Plan: I discussed this with her.  I would have her try Afrin to see if this will relieve any symptoms she might have with the descent of her airplane.  I would have her pay attention to possible TMJ syndrome, including asking her spouse.  I talked with her about tonsillectomy, but I would not recommend tonsillectomy at her age and with minimal discomfort.  I will order audiogram and tympanograms and call her with these results.

## 2020-05-04 ENCOUNTER — Ambulatory Visit (HOSPITAL_COMMUNITY)
Admission: RE | Admit: 2020-05-04 | Discharge: 2020-05-04 | Disposition: A | Discharge status: Home / Self Care | Payer: 59 | Source: Ambulatory Visit | Attending: Hematology and Oncology | Admitting: Hematology and Oncology

## 2020-05-04 ENCOUNTER — Other Ambulatory Visit: Payer: Self-pay

## 2020-05-04 DIAGNOSIS — D472 Monoclonal gammopathy: Secondary | ICD-10-CM | POA: Diagnosis present

## 2020-05-15 ENCOUNTER — Ambulatory Visit (INDEPENDENT_AMBULATORY_CARE_PROVIDER_SITE_OTHER): Payer: 59 | Admitting: Internal Medicine

## 2020-05-15 ENCOUNTER — Encounter: Payer: Self-pay | Admitting: Internal Medicine

## 2020-05-15 ENCOUNTER — Other Ambulatory Visit: Payer: Self-pay

## 2020-05-15 VITALS — BP 140/92 | HR 89 | Temp 97.5°F | Resp 16 | Wt 258.0 lb

## 2020-05-15 DIAGNOSIS — G5603 Carpal tunnel syndrome, bilateral upper limbs: Secondary | ICD-10-CM | POA: Diagnosis not present

## 2020-05-15 DIAGNOSIS — E063 Autoimmune thyroiditis: Secondary | ICD-10-CM

## 2020-05-15 DIAGNOSIS — H9202 Otalgia, left ear: Secondary | ICD-10-CM | POA: Diagnosis not present

## 2020-05-15 DIAGNOSIS — F40243 Fear of flying: Secondary | ICD-10-CM

## 2020-05-15 DIAGNOSIS — E038 Other specified hypothyroidism: Secondary | ICD-10-CM | POA: Diagnosis not present

## 2020-05-15 DIAGNOSIS — Z13228 Encounter for screening for other metabolic disorders: Secondary | ICD-10-CM

## 2020-05-15 DIAGNOSIS — G4700 Insomnia, unspecified: Secondary | ICD-10-CM

## 2020-05-15 DIAGNOSIS — E039 Hypothyroidism, unspecified: Secondary | ICD-10-CM

## 2020-05-15 MED ORDER — OMEPRAZOLE 40 MG PO CPDR
40.0000 mg | DELAYED_RELEASE_CAPSULE | Freq: Every day | ORAL | 1 refills | Status: DC | PRN
Start: 2020-05-15 — End: 2020-07-02

## 2020-05-15 MED ORDER — LEVOTHYROXINE SODIUM 137 MCG PO TABS: 137.0000 ug | ORAL_TABLET | Freq: Every day | ORAL | 1 refills | Status: DC

## 2020-05-15 MED ORDER — DIAZEPAM 5 MG PO TABS: 5.0000 mg | ORAL_TABLET | Freq: Two times a day (BID) | ORAL | 0 refills | Status: DC | PRN

## 2020-05-15 MED ORDER — TRAZODONE HCL 50 MG PO TABS: 50.0000 mg | ORAL_TABLET | Freq: Every evening | ORAL | 1 refills | Status: DC | PRN

## 2020-05-15 NOTE — Progress Notes (Signed)
Subjective:    Heather Campos - 45 y.o. female MRN [id number]  Date of birth: 21-Mar-1975  HPI  Heather Campos is here for multiple concerns. Reports numbness of first 3 fingers in hands bilaterally. Has been diagnosed with carpal tunnel in past. Currently wearing wrist splint she bought at drug store.   Has had persistent left ear pain. Treated for presumed AOM in early March. Has also been seen by ENT. Concern for eustachian tube dysfunction. Has audiology testing.      Health Maintenance:  There are no preventive care reminders to display for this patient.  -  reports that she has quit smoking. She has never used smokeless tobacco. - Review of Systems: Per HPI. - Past Medical History: Patient Active Problem List   Diagnosis Date Noted  . Insomnia 04/14/2020  . Fibroid uterus 07/04/2019  . Uterine fibroid 07/04/2019  . Tachycardia 05/14/2019  . History of COVID-19 05/13/2019  . Hair loss 05/13/2019  . OSA (obstructive sleep apnea) 02/09/2016  . Family history of systemic lupus erythematosus 02/09/2016  . Vitamin D deficiency 09/02/2015  . Morbid obesity due to excess calories ([place]) 09/02/2015  . Iron deficiency anemia due to chronic blood loss 09/02/2015  . Hypothyroidism due to Hashimoto's thyroiditis 09/02/2015   - Medications: reviewed and updated   Objective:   Physical Exam BP (!) 140/92   Pulse 89   Temp (!) 97.5 F (36.4 C)   Resp 16   Wt 258 lb (117 kg)   LMP 06/24/2019   SpO2 96%   BMI 44.29 kg/m  Physical Exam Constitutional:      General: She is not in acute distress.    Appearance: She is not diaphoretic.  HENT:     Right Ear: Tympanic membrane, ear canal and external ear normal. There is no impacted cerumen.     Left Ear: Tympanic membrane, ear canal and external ear normal. There is no impacted cerumen.  Cardiovascular:     Rate and Rhythm: Normal rate.  Pulmonary:     Effort: Pulmonary effort is normal. No respiratory distress.   Musculoskeletal:        General: Normal range of motion.  Skin:    General: Skin is warm and dry.  Neurological:     Mental Status: She is alert and oriented to person, place, and time.  Psychiatric:        Mood and Affect: Affect normal.        Judgment: Judgment normal.            Assessment & Plan:   1. Otalgia of left ear Exam unremarkable. Continue follow up with ENT.   2. Fear of flying - diazepam (VALIUM) 5 MG tablet; Take 1 tablet (5 mg total) by mouth every 12 (twelve) hours as needed for anxiety.  Dispense: 10 tablet; Refill: 0  3. Bilateral carpal tunnel syndrome Paper Rx given for bilateral cock up wrist splints. Discussed other supportive care and conservative measures of treatment. If no improvement, would send for nerve conduction studies and consideration for surgery.   4. Hypothyroidism due to Hashimoto's thyroiditis Monitor TSH and continue medication.  - levothyroxine (EUTHYROX) 137 MCG tablet; Take 1 tablet (137 mcg total) by mouth daily.  Dispense: 90 tablet; Refill: 1 - TSH  5. Insomnia, unspecified type - traZODone (DESYREL) 50 MG tablet; Take 1 tablet (50 mg total) by mouth at bedtime as needed for sleep.  Dispense: 90 tablet; Refill: 1  6. Screening for metabolic disorder - Lipid  panel     Phill Myron, D.O. 05/15/2020, 10:17 AM Primary Care at [facility]

## 2020-05-15 NOTE — Progress Notes (Signed)
Left ear feels like it's stopped up. Was evaluated by mobile unit Has to get hearing test on 4/8  Tingling in both hands D/x with carpal tunnel  Requesting lab test and valium 5 mg for flight

## 2020-05-16 LAB — LIPID PANEL
Chol/HDL Ratio: 3.2 ratio (ref 0.0–4.4)
Cholesterol, Total: 189 mg/dL (ref 100–199)
HDL: 60 mg/dL (ref >39)
LDL Chol Calc (NIH): 116 mg/dL — ABNORMAL HIGH (ref 0–99)
Triglycerides: 73 mg/dL (ref 0–149)
VLDL Cholesterol Cal: 13 mg/dL (ref 5–40)

## 2020-05-16 LAB — TSH: TSH: 0.502 u[IU]/mL (ref 0.450–4.500)

## 2020-05-18 ENCOUNTER — Encounter: Payer: Self-pay | Admitting: Physician Assistant

## 2020-05-18 ENCOUNTER — Telehealth: Payer: Self-pay | Admitting: *Deleted

## 2020-05-18 NOTE — Telephone Encounter (Signed)
Patient verified DOB Patient is aware of labs results being normal and to continue with current dose of synthroid. Patient aware of cleaning up eating habits and implementing regular exercise to address elevated LDL.

## 2020-05-18 NOTE — Telephone Encounter (Signed)
-----   Message from Nicolette Bang, DO sent at 05/18/2020 12:03 PM EDT ----- Please call [facility] about results. TSH within normal limits--no change to Synthroid. LDL, bad cholesterol, is just mildly elevated at 116. Would recommend aiming for 150 minutes of cardiac exercise per week. Advise to decrease animal fat intake, saturated fats, processed foods. Increase fiber rich foods such as fruits and veggies.     The 10-year ASCVD risk score Mikey Bussing DC Brooke Bonito., et al., 2013) is: 1%   Values used to calculate the score:     Age: 45 years     Sex: Female     Is Non-Hispanic African American: Yes     Diabetic: No     Tobacco smoker: No     Systolic Blood Pressure: 112 mmHg     Is BP treated: No     HDL Cholesterol: 60 mg/dL     Total Cholesterol: 189 mg/dL   Thanks,  Phill Myron, D.O. Primary Care at [facility]  05/18/2020, 11:45 AM

## 2020-05-21 NOTE — Telephone Encounter (Signed)
Do we have a form at the office that I can complete for this?   Phill Myron, D.O. Primary Care at [facility]  05/21/2020, 4:43 PM

## 2020-05-25 NOTE — Telephone Encounter (Signed)
Please assist PCP in completing a CMN for patients CPAP request.

## 2020-05-29 ENCOUNTER — Ambulatory Visit: Payer: 59 | Attending: Otolaryngology | Admitting: Audiologist

## 2020-05-29 ENCOUNTER — Telehealth: Payer: Self-pay

## 2020-05-29 ENCOUNTER — Other Ambulatory Visit: Payer: Self-pay

## 2020-05-29 DIAGNOSIS — H6982 Other specified disorders of Eustachian tube, left ear: Secondary | ICD-10-CM | POA: Diagnosis not present

## 2020-05-29 NOTE — Procedures (Signed)
  Outpatient Audiology and [place], [place]  [address] [phone]  AUDIOLOGICAL  EVALUATION  NAME: Heather Campos     DOB:   May 23, 1975      MRN: [id number]                                                                                     DATE: 05/29/2020     REFERENT: Dr. Jodi Marble  STATUS: Outpatient DIAGNOSIS: Normal Hearing   History: Heather Campos was seen for an audiological evaluation.  Heather Campos is receiving a hearing evaluation due to concerns for pressure in her left ear. Heather Campos has difficulty with her left ear ever since having COVID last January. She says she has had several ear infections and pain on and off in the left ear. She saw Dr. Erik Obey who told her the tube in her left ear was not functioning well. Since seeing him the pain has decreased and she thinks her left ear is better. Heather Campos has not experienced any changes in her hearing that she can tell.  She is flying to [place] for work and wants to confirm that her left ear will be ok for the pressure changes of flying.  No other relevant case history reported.   Evaluation:   Otoscopy showed a clear view of the tympanic membranes, bilaterally, Heather Campos assured her left eardum looks healthy   Tympanometry results were consistent with normal function of the middle ear bilaterally, no indication of eustachian tube dysfunction, pressure changes with valsalva were normal in the left ear.   Audiometric testing was completed using conventional audiometry with insert transducer. Speech Recognition Thresholds were consistent with pure tone averages. Word Recognition was excellent at conversation level. Pure tone thresholds show normal hearing in both ears. Test results are consistent with normal hearing.   Results:  The test results were reviewed with Heather Campos. Her left ear is functioning normally and there is no indication of middle ear problems at this time. She has good hearing in both ears. She  was assured that some feeling of pressure change in the ears is normal with flights. Heather Campos reported understanding results today.    Recommendations: 1.   No further audiologic testing is needed unless future hearing concerns arise, normal hearing in both ears.    Alfonse Alpers  Audiologist, Au.D., CCC-A 05/29/2020  8:16 AM  Cc: Nicolette Bang, DO

## 2020-05-29 NOTE — Telephone Encounter (Signed)
-----   Message from Jodi Marble, MD sent at 04/26/3910  9:13 AM EDT ----- Hearing and ear pressure all measure normal.  Recheck if still symptomatic please.

## 2020-05-29 NOTE — Telephone Encounter (Signed)
Patient name and DOB has been verified Patient was informed of lab results. Patient had no questions.  

## 2020-05-31 ENCOUNTER — Other Ambulatory Visit: Payer: Self-pay | Admitting: Physician Assistant

## 2020-06-17 ENCOUNTER — Ambulatory Visit: Payer: 59 | Admitting: Internal Medicine

## 2020-06-23 ENCOUNTER — Ambulatory Visit: Payer: 59 | Admitting: Rheumatology

## 2020-06-26 ENCOUNTER — Other Ambulatory Visit: Payer: Self-pay | Admitting: Physician Assistant

## 2020-06-30 NOTE — Progress Notes (Deleted)
Office Visit Note  Patient: Heather Campos             Date of Birth: Oct 09, 1975           MRN: [id number]             PCP: Nicolette Bang, DO Referring: Caryl Never* Visit Date: 07/14/2020 Occupation: @GUAROCC @  Subjective:  No chief complaint on file.   History of Present Illness: Heather Campos is a 45 y.o. female ***   Activities of Daily Living:  Patient reports morning stiffness for *** {minute/hour:19697}.   Patient {ACTIONS;DENIES/REPORTS:21021675::"Denies"} nocturnal pain.  Difficulty dressing/grooming: {ACTIONS;DENIES/REPORTS:21021675::"Denies"} Difficulty climbing stairs: {ACTIONS;DENIES/REPORTS:21021675::"Denies"} Difficulty getting out of chair: {ACTIONS;DENIES/REPORTS:21021675::"Denies"} Difficulty using hands for taps, buttons, cutlery, and/or writing: {ACTIONS;DENIES/REPORTS:21021675::"Denies"}  No Rheumatology ROS completed.   PMFS History:  Patient Active Problem List   Diagnosis Date Noted  . Insomnia 04/14/2020  . Fibroid uterus 07/04/2019  . Uterine fibroid 07/04/2019  . Tachycardia 05/14/2019  . History of COVID-19 05/13/2019  . Hair loss 05/13/2019  . OSA (obstructive sleep apnea) 02/09/2016  . Family history of systemic lupus erythematosus 02/09/2016  . Vitamin D deficiency 09/02/2015  . Morbid obesity due to excess calories ([place]) 09/02/2015  . Iron deficiency anemia due to chronic blood loss 09/02/2015  . Hypothyroidism due to Hashimoto's thyroiditis 09/02/2015    Past Medical History:  Diagnosis Date  . Allergic rhinitis   . Anxiety   . Chronic back pain   . Family history of systemic lupus erythematosus   . GERD (gastroesophageal reflux disease)   . Hypothyroidism due to Hashimoto's thyroiditis   . IDA (iron deficiency anemia)   . Morbid obesity ([place])   . OSA (obstructive sleep apnea)    No CPAP  . Vitamin D deficiency     Family History  Problem Relation Age of Onset  . Lupus Mother   . Kidney disease  Mother   . Lupus Cousin    Past Surgical History:  Procedure Laterality Date  . ABDOMINAL HYSTERECTOMY    . CESAREAN SECTION    . CHOLECYSTECTOMY    . CYSTOSCOPY  07/04/2019   Procedure: CYSTOSCOPY;  Surgeon: Sanjuana Kava, MD;  Location: [facility];  Service: Gynecology;;  . HYSTERECTOMY ABDOMINAL WITH SALPINGECTOMY Bilateral 07/04/2019   Procedure: ABDOMINAL  suprcervical hysterctomyWITH SALPINGECTOMY ;  Surgeon: Sanjuana Kava, MD;  Location: [facility];  Service: Gynecology;  Laterality: Bilateral;  . TOTAL LAPAROSCOPIC HYSTERECTOMY WITH SALPINGECTOMY Bilateral 07/04/2019   Procedure: attemted TOTAL LAPAROSCOPIC HYSTERECTOMY WITH SALPINGECTOMY;  Surgeon: Sanjuana Kava, MD;  Location: [facility];  Service: Gynecology;  Laterality: Bilateral;  3 hours   Social History   Social History Narrative  . Not on file   Immunization History  Administered Date(s) Administered  . Influenza,inj,Quad PF,6+ Mos 01/17/2018, 12/23/2018, 12/06/2019  . PFIZER(Purple Top)SARS-COV-2 Vaccination 06/05/2019, 06/26/2019, 01/14/2020  . Tdap 03/14/2018     Objective: Vital Signs: LMP 06/24/2019    Physical Exam   Musculoskeletal Exam: ***  CDAI Exam: CDAI Score: -- Patient Global: --; Provider Global: -- Swollen: --; Tender: -- Joint Exam 07/14/2020   No joint exam has been documented for this visit   There is currently no information documented on the homunculus. Go to the Rheumatology activity and complete the homunculus joint exam.  Investigation: No additional findings.  Imaging: No results found.  Recent Labs: Lab Results  Component Value Date   WBC 7.8 04/15/2020   HGB 12.7 04/15/2020   PLT 241  04/15/2020   NA 138 04/15/2020   K 4.1 04/15/2020   CL 105 04/15/2020   CO2 24 04/15/2020   GLUCOSE 94 04/15/2020   BUN 7 04/15/2020   CREATININE 0.79 04/15/2020   BILITOT 0.7 04/15/2020   ALKPHOS 78 04/15/2020   AST 18 04/15/2020   ALT 18  04/15/2020   PROT 7.4 04/15/2020   ALBUMIN 3.6 04/15/2020   CALCIUM 8.8 (L) 04/15/2020   GFRAA 117 12/26/2019    Speciality Comments: No specialty comments available.  Procedures:  No procedures performed Allergies: No known allergies   Assessment / Plan:     Visit Diagnoses: No diagnosis found.  Orders: No orders of the defined types were placed in this encounter.  No orders of the defined types were placed in this encounter.   Face-to-face time spent with patient was *** minutes. Greater than 50% of time was spent in counseling and coordination of care.  Follow-Up Instructions: No follow-ups on file.   Earnestine Mealing, CMA  Note - This record has been created using Editor, commissioning.  Chart creation errors have been sought, but may not always  have been located. Such creation errors do not reflect on  the standard of medical care.

## 2020-07-14 ENCOUNTER — Ambulatory Visit: Payer: 59 | Admitting: Rheumatology

## 2020-07-14 DIAGNOSIS — G4733 Obstructive sleep apnea (adult) (pediatric): Secondary | ICD-10-CM

## 2020-07-14 DIAGNOSIS — E559 Vitamin D deficiency, unspecified: Secondary | ICD-10-CM

## 2020-07-14 DIAGNOSIS — Z8616 Personal history of COVID-19: Secondary | ICD-10-CM

## 2020-07-14 DIAGNOSIS — Z8269 Family history of other diseases of the musculoskeletal system and connective tissue: Secondary | ICD-10-CM

## 2020-07-14 DIAGNOSIS — G8929 Other chronic pain: Secondary | ICD-10-CM

## 2020-07-14 DIAGNOSIS — R778 Other specified abnormalities of plasma proteins: Secondary | ICD-10-CM

## 2020-07-14 DIAGNOSIS — E038 Other specified hypothyroidism: Secondary | ICD-10-CM

## 2020-07-14 DIAGNOSIS — L659 Nonscarring hair loss, unspecified: Secondary | ICD-10-CM

## 2020-07-14 DIAGNOSIS — D500 Iron deficiency anemia secondary to blood loss (chronic): Secondary | ICD-10-CM

## 2020-07-23 ENCOUNTER — Other Ambulatory Visit: Payer: Self-pay | Admitting: Family

## 2020-07-23 DIAGNOSIS — E038 Other specified hypothyroidism: Secondary | ICD-10-CM

## 2020-07-24 ENCOUNTER — Telehealth: Payer: Self-pay | Admitting: Internal Medicine

## 2020-07-24 NOTE — Telephone Encounter (Signed)
Pt called in stating she is really needing a CPAP. Stating the place she was told to go to get it still needs the paperwork from her PCP.

## 2020-07-27 NOTE — Telephone Encounter (Signed)
On 04/29/2020, 8 pages were successfully faxed to [phone].  Will refax again today.

## 2020-08-13 DIAGNOSIS — M545 Low back pain, unspecified: Secondary | ICD-10-CM | POA: Insufficient documentation

## 2020-08-26 NOTE — Progress Notes (Signed)
Patient ID: Heather Campos, female    DOB: [birth year]  MRN: [id number]  CC: Hypothyroidism Follow-Up   Subjective: Heather Campos is a 45 y.o. female who presents for hypothyroidism follow-up.   Her concerns today include:   HYPOTHYROIDISM FOLLOW-UP: 05/15/2020 per DO note: Monitor TSH and continue medication Levothyroxine.   08/28/2020: Doing well on current regimen. No side effects. No issues/concerns.   2. SLEEP APNEA FOLLOW-UP: Reports she had visit earlier this year around February 2022 and requested a CPAP. Reports since then she has been unable to receive an order for the CPAP machine from previous primary provider. Has called several times without follow-up to completion. Requesting an updated order for the same be sent.    Patient Active Problem List   Diagnosis Date Noted   Insomnia 04/14/2020   Fibroid uterus 07/04/2019   Uterine fibroid 07/04/2019   Tachycardia 05/14/2019   History of COVID-19 05/13/2019   Hair loss 05/13/2019   OSA (obstructive sleep apnea) 02/09/2016   Family history of systemic lupus erythematosus 02/09/2016   Vitamin D deficiency 09/02/2015   Morbid obesity due to excess calories ([place]) 09/02/2015   Iron deficiency anemia due to chronic blood loss 09/02/2015   Hypothyroidism due to Hashimoto's thyroiditis 09/02/2015     Current Outpatient Medications on File Prior to Visit  Medication Sig Dispense Refill   clindamycin (CLEOCIN T) 1 % lotion Apply 1 application topically daily.      diazepam (VALIUM) 5 MG tablet Take 1 tablet (5 mg total) by mouth every 12 (twelve) hours as needed for anxiety. 10 tablet 0   gabapentin (NEURONTIN) 300 MG capsule Take 300 mg by mouth daily.     HYDROcodone-acetaminophen (NORCO) 10-325 MG tablet Take 1 tablet by mouth every 8 (eight) hours as needed.     ibuprofen (ADVIL) 800 MG tablet Take 1 tablet (800 mg total) by mouth 3 (three) times daily. (Patient not taking: No sig reported) 21 tablet 0   methocarbamol  (ROBAXIN-750) 750 MG tablet Take 1 tablet (750 mg total) by mouth every 8 (eight) hours as needed for muscle spasms. (Patient not taking: No sig reported) 30 tablet 0   omeprazole (PRILOSEC) 40 MG capsule TAKE 1 CAPSULE BY MOUTH ONCE DAILY AS NEEDED 90 capsule 0   traZODone (DESYREL) 50 MG tablet Take 1 tablet (50 mg total) by mouth at bedtime as needed for sleep. 90 tablet 1   Vitamin D, Ergocalciferol, (DRISDOL) 1.25 MG (50000 UNIT) CAPS capsule Take 1 capsule (50,000 Units total) by mouth every 7 (seven) days. 4 capsule 2   No current facility-administered medications on file prior to visit.    Allergies  Allergen Reactions   No Known Allergies     Social History   Socioeconomic History   Marital status: Married    Spouse name: Not on file   Number of children: Not on file   Years of education: Not on file   Highest education level: Not on file  Occupational History   Not on file  Tobacco Use   Smoking status: Former    Pack years: 0.00   Smokeless tobacco: Never  Vaping Use   Vaping Use: Never used  Substance and Sexual Activity   Alcohol use: No   Drug use: No   Sexual activity: Yes    Birth control/protection: None  Other Topics Concern   Not on file  Social History Narrative   Not on file   Social Determinants of Health   Financial  Resource Strain: Not on file  Food Insecurity: Not on file  Transportation Needs: Not on file  Physical Activity: Not on file  Stress: Not on file  Social Connections: Not on file  Intimate Partner Violence: Not on file    Family History  Problem Relation Age of Onset   Lupus Mother    Kidney disease Mother    Lupus Cousin     Past Surgical History:  Procedure Laterality Date   ABDOMINAL HYSTERECTOMY     CESAREAN SECTION     CHOLECYSTECTOMY     CYSTOSCOPY  07/04/2019   Procedure: CYSTOSCOPY;  Surgeon: Sanjuana Kava, MD;  Location: [facility];  Service: Gynecology;;   HYSTERECTOMY ABDOMINAL WITH  SALPINGECTOMY Bilateral 07/04/2019   Procedure: ABDOMINAL  suprcervical hysterctomyWITH SALPINGECTOMY ;  Surgeon: Sanjuana Kava, MD;  Location: [facility];  Service: Gynecology;  Laterality: Bilateral;   TOTAL LAPAROSCOPIC HYSTERECTOMY WITH SALPINGECTOMY Bilateral 07/04/2019   Procedure: attemted TOTAL LAPAROSCOPIC HYSTERECTOMY WITH SALPINGECTOMY;  Surgeon: Sanjuana Kava, MD;  Location: [facility];  Service: Gynecology;  Laterality: Bilateral;  3 hours    ROS: Review of Systems Negative except as stated above  PHYSICAL EXAM: BP 123/84 (BP Location: Left Arm, Patient Position: Sitting, Cuff Size: Large)   Pulse 100   Temp 98.1 F (36.7 C)   Resp 16   Ht 5' 4.02" (1.626 m)   Wt 267 lb 12.8 oz (121.5 kg)   LMP 06/24/2019   SpO2 96%   BMI 45.94 kg/m   Physical Exam HENT:     Head: Normocephalic and atraumatic.  Eyes:     Extraocular Movements: Extraocular movements intact.     Conjunctiva/sclera: Conjunctivae normal.     Pupils: Pupils are equal, round, and reactive to light.  Cardiovascular:     Rate and Rhythm: Normal rate and regular rhythm.     Pulses: Normal pulses.     Heart sounds: Normal heart sounds.  Pulmonary:     Effort: Pulmonary effort is normal.     Breath sounds: Normal breath sounds.  Musculoskeletal:     Cervical back: Normal range of motion and neck supple.  Neurological:     General: No focal deficit present.     Mental Status: She is alert and oriented to person, place, and time.  Psychiatric:        Mood and Affect: Mood normal.        Behavior: Behavior normal.    ASSESSMENT AND PLAN: 1. Hypothyroidism due to Hashimoto's thyroiditis: - Continue Levothyroxine as prescribed.  - TSH to check level of thyroid function.  - Follow-up with primary provider as scheduled.  - TSH - levothyroxine (EUTHYROX) 137 MCG tablet; Take 1 tablet (137 mcg total) by mouth daily.  Dispense: 90 tablet; Refill: 0  2. OSA (obstructive sleep  apnea): - Patient reports waiting for CPAP order since February 2022 with previous primary provider.  - Updated order for CPAP placed today. My CMA faxed over the orders to the DME company for the same.  - Follow-up with primary care as needed.  - For home use only DME continuous positive airway pressure (CPAP)    Patient was given the opportunity to ask questions.  Patient verbalized understanding of the plan and was able to repeat key elements of the plan. Patient was given clear instructions to go to Emergency Department or return to medical center if symptoms don't improve, worsen, or new problems develop.The patient verbalized understanding.   Orders  Placed This Encounter  Procedures   For home use only DME continuous positive airway pressure (CPAP)   TSH     Requested Prescriptions   Signed Prescriptions Disp Refills   levothyroxine (EUTHYROX) 137 MCG tablet 90 tablet 0    Sig: Take 1 tablet (137 mcg total) by mouth daily.     Camillia Herter, NP

## 2020-08-28 ENCOUNTER — Ambulatory Visit (INDEPENDENT_AMBULATORY_CARE_PROVIDER_SITE_OTHER): Payer: 59 | Admitting: Family

## 2020-08-28 ENCOUNTER — Other Ambulatory Visit: Payer: Self-pay

## 2020-08-28 ENCOUNTER — Encounter: Payer: Self-pay | Admitting: Family

## 2020-08-28 VITALS — BP 123/84 | HR 100 | Temp 98.1°F | Resp 16 | Ht 64.02 in | Wt 267.8 lb

## 2020-08-28 DIAGNOSIS — G4733 Obstructive sleep apnea (adult) (pediatric): Secondary | ICD-10-CM | POA: Diagnosis not present

## 2020-08-28 DIAGNOSIS — E063 Autoimmune thyroiditis: Secondary | ICD-10-CM

## 2020-08-28 DIAGNOSIS — E038 Other specified hypothyroidism: Secondary | ICD-10-CM

## 2020-08-28 MED ORDER — LEVOTHYROXINE SODIUM 137 MCG PO TABS: 137.0000 ug | ORAL_TABLET | Freq: Every day | ORAL | 0 refills | Status: DC

## 2020-08-28 NOTE — Progress Notes (Signed)
Pt presents to follow-up on CPAP order and supplies, pt has been waiting since February

## 2020-08-29 LAB — TSH: TSH: 1.24 u[IU]/mL (ref 0.450–4.500)

## 2020-08-29 NOTE — Progress Notes (Signed)
Thyroid normal. Continue Levothyroxine.

## 2020-09-01 NOTE — Telephone Encounter (Signed)
error 

## 2020-09-28 ENCOUNTER — Encounter: Payer: Self-pay | Admitting: Family

## 2020-10-06 DIAGNOSIS — M7531 Calcific tendinitis of right shoulder: Secondary | ICD-10-CM | POA: Insufficient documentation

## 2020-10-06 DIAGNOSIS — M25511 Pain in right shoulder: Secondary | ICD-10-CM | POA: Insufficient documentation

## 2020-10-16 ENCOUNTER — Inpatient Hospital Stay: Payer: No Typology Code available for payment source | Attending: Hematology and Oncology

## 2020-10-16 ENCOUNTER — Other Ambulatory Visit: Payer: Self-pay | Admitting: Hematology and Oncology

## 2020-10-16 ENCOUNTER — Other Ambulatory Visit: Payer: Self-pay

## 2020-10-16 DIAGNOSIS — D472 Monoclonal gammopathy: Secondary | ICD-10-CM

## 2020-10-16 LAB — CBC WITH DIFFERENTIAL (CANCER CENTER ONLY)
Abs Immature Granulocytes: 0.04 10*3/uL (ref 0.00–0.07)
Basophils Absolute: 0.1 10*3/uL (ref 0.0–0.1)
Basophils Relative: 1 %
Eosinophils Absolute: 0.2 10*3/uL (ref 0.0–0.5)
Eosinophils Relative: 2 %
HCT: 37.8 % (ref 36.0–46.0)
Hemoglobin: 12.8 g/dL (ref 12.0–15.0)
Immature Granulocytes: 0 %
Lymphocytes Relative: 39 %
Lymphs Abs: 4 10*3/uL (ref 0.7–4.0)
MCH: 27.9 pg (ref 26.0–34.0)
MCHC: 33.9 g/dL (ref 30.0–36.0)
MCV: 82.5 fL (ref 80.0–100.0)
Monocytes Absolute: 0.8 10*3/uL (ref 0.1–1.0)
Monocytes Relative: 8 %
Neutro Abs: 5.2 10*3/uL (ref 1.7–7.7)
Neutrophils Relative %: 50 %
Platelet Count: 285 10*3/uL (ref 150–400)
RBC: 4.58 MIL/uL (ref 3.87–5.11)
RDW: 13.8 % (ref 11.5–15.5)
WBC Count: 10.3 10*3/uL (ref 4.0–10.5)
nRBC: 0 % (ref 0.0–0.2)

## 2020-10-16 LAB — CMP (CANCER CENTER ONLY)
ALT: 27 U/L (ref 0–44)
AST: 20 U/L (ref 15–41)
Albumin: 3.8 g/dL (ref 3.5–5.0)
Alkaline Phosphatase: 100 U/L (ref 38–126)
Anion gap: 9 (ref 5–15)
BUN: 9 mg/dL (ref 6–20)
CO2: 27 mmol/L (ref 22–32)
Calcium: 9.4 mg/dL (ref 8.9–10.3)
Chloride: 104 mmol/L (ref 98–111)
Creatinine: 0.91 mg/dL (ref 0.44–1.00)
GFR, Estimated: >60 mL/min (ref >60)
Glucose, Bld: 96 mg/dL (ref 70–99)
Potassium: 4.3 mmol/L (ref 3.5–5.1)
Sodium: 140 mmol/L (ref 135–145)
Total Bilirubin: 0.9 mg/dL (ref 0.3–1.2)
Total Protein: 7.8 g/dL (ref 6.5–8.1)

## 2020-10-16 LAB — LACTATE DEHYDROGENASE: LDH: 191 U/L (ref 98–192)

## 2020-10-16 NOTE — Progress Notes (Signed)
Patient did not show for appointment.   

## 2020-10-19 LAB — KAPPA/LAMBDA LIGHT CHAINS
Kappa free light chain: 25.7 mg/L — ABNORMAL HIGH (ref 3.3–19.4)
Kappa, lambda light chain ratio: 1.36 (ref 0.26–1.65)
Lambda free light chains: 18.9 mg/L (ref 5.7–26.3)

## 2020-10-20 ENCOUNTER — Encounter: Payer: 59 | Admitting: Family

## 2020-10-20 ENCOUNTER — Other Ambulatory Visit: Payer: Self-pay | Admitting: Hematology and Oncology

## 2020-10-20 DIAGNOSIS — D472 Monoclonal gammopathy: Secondary | ICD-10-CM

## 2020-10-20 LAB — MULTIPLE MYELOMA PANEL, SERUM
Albumin SerPl Elph-Mcnc: 3.7 g/dL (ref 2.9–4.4)
Albumin/Glob SerPl: 1.1 (ref 0.7–1.7)
Alpha 1: 0.2 g/dL (ref 0.0–0.4)
Alpha2 Glob SerPl Elph-Mcnc: 0.6 g/dL (ref 0.4–1.0)
B-Globulin SerPl Elph-Mcnc: 1.2 g/dL (ref 0.7–1.3)
Gamma Glob SerPl Elph-Mcnc: 1.6 g/dL (ref 0.4–1.8)
Globulin, Total: 3.5 g/dL (ref 2.2–3.9)
IgA: 183 mg/dL (ref 87–352)
IgG (Immunoglobin G), Serum: 1729 mg/dL — ABNORMAL HIGH (ref 586–1602)
IgM (Immunoglobulin M), Srm: 31 mg/dL (ref 26–217)
M Protein SerPl Elph-Mcnc: 0.7 g/dL — ABNORMAL HIGH
Total Protein ELP: 7.2 g/dL (ref 6.0–8.5)

## 2020-10-20 NOTE — Progress Notes (Signed)
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  2) 01/13/2020: establish care with Dr. Lorenso Courier. Kappa 18.3, Lambda 15.8, Ratio 1.16. M protein 0.6 3) 04/15/2020: M protein 0.5, Lambda 18.7, Kappa 21.8, ratio 1.17 4) 10/16/2020: WBC 10.3, Hgb 12.8, MCV 82.5, Plt 285. M protein 0.7, kappa 25.7, Lambda 18.9, ratio 13.6  Interval History:  Heather Campos 45 y.o. female with medical history significant for IgG Lambda MGUS who presents for a follow up visit. The patient's last visit was on 04/15/2020. In the interim since the last visit she has had no major changes in her health.   On exam today Mrs. Dewilde is accompanied by her husband.  She reports that she feels well overall with no major changes in her health since her last talk.  She notes her energy levels are good and her appetite is strong.  Her weight has been stable she has not had any new bone or back pain.  She otherwise denies any fevers, chills, sweats, nausea, vomiting or diarrhea.  A full 10 point ROS is listed below.  MEDICAL HISTORY:  Past Medical History:  Diagnosis Date   Allergic rhinitis    Anxiety    Chronic back pain    Family history of systemic lupus erythematosus    GERD (gastroesophageal reflux disease)    Hypothyroidism due to Hashimoto's thyroiditis    IDA (iron deficiency anemia)    Morbid obesity ([facility])    OSA (obstructive sleep apnea)    No CPAP   Vitamin D deficiency     SURGICAL HISTORY: Past Surgical History:  Procedure Laterality Date   ABDOMINAL HYSTERECTOMY     CESAREAN SECTION     CHOLECYSTECTOMY     CYSTOSCOPY  07/04/2019   Procedure: CYSTOSCOPY;  Surgeon: Sanjuana Kava, MD;  Location: [facility];  Service: Gynecology;;   HYSTERECTOMY ABDOMINAL WITH SALPINGECTOMY Bilateral 07/04/2019   Procedure: ABDOMINAL  suprcervical hysterctomyWITH SALPINGECTOMY ;  Surgeon: Sanjuana Kava, MD;  Location: [facility];  Service: Gynecology;  Laterality: Bilateral;   TOTAL LAPAROSCOPIC HYSTERECTOMY WITH SALPINGECTOMY Bilateral 07/04/2019   Procedure: attemted TOTAL LAPAROSCOPIC HYSTERECTOMY WITH SALPINGECTOMY;  Surgeon: Sanjuana Kava, MD;  Location: [facility];  Service: Gynecology;  Laterality: Bilateral;  3 hours    SOCIAL HISTORY: Social History   Socioeconomic History   Marital status: Married    Spouse name: Not on file   Number of children: Not on file   Years of education: Not on file   Highest education level: Not on file  Occupational History   Not on file  Tobacco Use   Smoking status: Former   Smokeless tobacco: Never  Vaping Use   Vaping Use: Never used  Substance and Sexual Activity   Alcohol use: No   Drug use: No   Sexual activity: Yes    Birth control/protection: None  Other Topics Concern   Not on file  Social History Narrative   Not on file   Social Determinants of Health   Financial Resource Strain: Not on file  Food Insecurity: Not on file  Transportation Needs: Not on file  Physical Activity: Not on file  Stress: Not on file  Social Connections: Not on file  Intimate Partner Violence: Not on  file    FAMILY HISTORY: Family History  Problem Relation Age of Onset   Lupus Mother    Kidney disease Mother    Lupus Cousin     ALLERGIES:  is allergic to no known allergies.  MEDICATIONS:  Current Outpatient Medications  Medication Sig Dispense Refill   clindamycin (CLEOCIN T) 1 % lotion Apply 1 application topically daily.      diazepam (VALIUM) 5 MG tablet Take 1 tablet (5 mg total) by mouth every 12 (twelve) hours as needed for anxiety. 10 tablet 0   gabapentin (NEURONTIN) 300 MG capsule Take 300 mg by mouth  daily.     HYDROcodone-acetaminophen (NORCO) 10-325 MG tablet Take 1 tablet by mouth every 8 (eight) hours as needed.     levothyroxine (EUTHYROX) 137 MCG tablet Take 1 tablet (137 mcg total) by mouth daily. 90 tablet 0   omeprazole (PRILOSEC) 40 MG capsule TAKE 1 CAPSULE BY MOUTH ONCE DAILY AS NEEDED 90 capsule 0   traZODone (DESYREL) 50 MG tablet Take 1 tablet (50 mg total) by mouth at bedtime as needed for sleep. 90 tablet 1   Vitamin D, Ergocalciferol, (DRISDOL) 1.25 MG (50000 UNIT) CAPS capsule Take 1 capsule (50,000 Units total) by mouth every 7 (seven) days. 4 capsule 2   No current facility-administered medications for this visit.    REVIEW OF SYSTEMS:   Constitutional: ( - ) fevers, ( - )  chills , ( - ) night sweats Eyes: ( - ) blurriness of vision, ( - ) double vision, ( - ) watery eyes Ears, nose, mouth, throat, and face: ( - ) mucositis, ( - ) sore throat Respiratory: ( - ) cough, ( - ) dyspnea, ( - ) wheezes Cardiovascular: ( - ) palpitation, ( - ) chest discomfort, ( - ) lower extremity swelling Gastrointestinal:  ( - ) nausea, ( - ) heartburn, ( - ) change in bowel habits Skin: ( - ) abnormal skin rashes Lymphatics: ( - ) new lymphadenopathy, ( - ) easy bruising Neurological: ( - ) numbness, ( - ) tingling, ( - ) new weaknesses Behavioral/Psych: ( - ) mood change, ( - ) new changes  All other systems were reviewed with the patient and are negative.  PHYSICAL EXAMINATION:  Vitals:   10/21/20 0852  BP: (!) 135/94  Pulse: 83  Resp: 18  Temp: 98.3 F (36.8 C)  SpO2: 100%    Filed Weights   10/21/20 0852  Weight: 269 lb 12.8 oz (122.4 kg)    GENERAL: well appearing middle aged [place] American female alert, no distress and comfortable SKIN: skin color, texture, turgor are normal, no rashes or significant lesions EYES: conjunctiva are pink and non-injected, sclera clear LUNGS: clear to auscultation and percussion with normal breathing effort HEART: regular rate &  rhythm and no murmurs and no lower extremity edema Musculoskeletal: no cyanosis of digits and no clubbing  PSYCH: alert & oriented x 3, fluent speech NEURO: no focal motor/sensory deficits  LABORATORY DATA:  I have reviewed the data as listed CBC Latest Ref Rng & Units 10/16/2020 04/15/2020 01/13/2020  WBC 4.0 - 10.5 K/uL 10.3 7.8 13.6(H)  Hemoglobin 12.0 - 15.0 g/dL 12.8 12.7 13.0  Hematocrit 36.0 - 46.0 % 37.8 37.9 38.8  Platelets 150 - 400 K/uL 285 241 303    CMP Latest Ref Rng & Units 10/16/2020 04/15/2020 01/13/2020  Glucose 70 - 99 mg/dL 96 94 91  BUN 6 - 20 mg/dL _0 Creatinine  0.44 - 1.00 mg/dL 0.91 0.79 0.83  Sodium 135 - 145 mmol/L 140 138 137  Potassium 3.5 - 5.1 mmol/L 4.3 4.1 3.8  Chloride 98 - 111 mmol/L 104 105 106  CO2 22 - 32 mmol/L _0 Calcium 8.9 - 10.3 mg/dL 9.4 8.8(L) 8.8(L)  Total Protein 6.5 - 8.1 g/dL 7.8 7.4 7.4  Total Bilirubin 0.3 - 1.2 mg/dL 0.9 0.7 0.7  Alkaline Phos 38 - 126 U/L 100 78 82  AST 15 - 41 U/L 20 18 13(L)  ALT 0 - 44 U/L _1 Lab Results  Component Value Date   MPROTEIN 0.7 (H) 10/16/2020   MPROTEIN 0.5 (H) 04/15/2020   MPROTEIN 0.6 (H) 01/13/2020   Lab Results  Component Value Date   KPAFRELGTCHN 25.7 (H) 10/16/2020   KPAFRELGTCHN 21.8 (H) 04/15/2020   KPAFRELGTCHN 18.3 01/13/2020   LAMBDASER 18.9 10/16/2020   LAMBDASER 18.7 04/15/2020   LAMBDASER 15.8 01/13/2020   KAPLAMBRATIO 1.36 10/16/2020   KAPLAMBRATIO 1.17 04/15/2020   KAPLAMBRATIO 6.73 01/21/2020    RADIOGRAPHIC STUDIES: No results found.   ASSESSMENT & PLAN Abella Shugart 44 y.o. female with medical history significant for IgG Lambda MGUS who presents for a follow up visit.   After review the labs, the records, discussion with the patient the findings most consistent with an IgG lambda monoclonal gammopathy of undetermined significance.  The patient does not have any of the high risk features that would prompt [place] to perform a bone marrow biopsy.   She has an M protein less than 1.5 which is of IgG specificity.  Her kappa and lambda light chains are also within normal limits with a normal ratio.  Given these findings I would recommend routine follow-up in approximately 6 months time.  Recommend that we obtain yearly UPEP and metastatic surveys.  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  This appears to be stable. --recommend a metastatic bone survey to be repeated yearly. Last one performed in March 2022 --assure yearly UPEP (will order this with met survey at next visit) -- No indication for a bone marrow biopsy at this time.  --RTC in 6 months or sooner if indicated by the above labs.   Orders Placed This Encounter  Procedures   CBC with Differential ([facility] Only)    Standing Status:   Standing    Number of Occurrences:   4    Standing Expiration Date:   10/21/2021   CMP ([facility] only)    Standing Status:   Standing    Number of Occurrences:   4    Standing Expiration Date:   10/21/2021   Lactate dehydrogenase (LDH)    Standing Status:   Standing    Number of Occurrences:   4    Standing Expiration Date:   10/21/2021   Multiple Myeloma Panel (SPEP&IFE w/QIG)    Standing Status:   Standing    Number of Occurrences:   4    Standing Expiration Date:   10/21/2021   Kappa/lambda light chains    Standing Status:   Standing    Number of Occurrences:   4    Standing Expiration Date:   10/21/2021   All questions were answered. The patient knows to call the clinic with any problems, questions or concerns.  A total of more than 30  minutes were spent on this encounter and over half of that time was spent on counseling and coordination of care as outlined above.   Ledell Peoples, MD Department of Hematology/Oncology [place] [facility] Phone:  [phone] Pager: [phone] Email: [email or web address]  10/21/2020 5:25 PM

## 2020-10-21 ENCOUNTER — Other Ambulatory Visit: Payer: Self-pay

## 2020-10-21 ENCOUNTER — Inpatient Hospital Stay (HOSPITAL_BASED_OUTPATIENT_CLINIC_OR_DEPARTMENT_OTHER): Payer: No Typology Code available for payment source | Admitting: Hematology and Oncology

## 2020-10-21 VITALS — BP 135/94 | HR 83 | Temp 98.3°F | Resp 18 | Wt 269.8 lb

## 2020-10-21 DIAGNOSIS — D500 Iron deficiency anemia secondary to blood loss (chronic): Secondary | ICD-10-CM | POA: Diagnosis not present

## 2020-10-21 DIAGNOSIS — D472 Monoclonal gammopathy: Secondary | ICD-10-CM | POA: Diagnosis not present

## 2020-10-23 ENCOUNTER — Inpatient Hospital Stay: Payer: 59 | Admitting: Hematology and Oncology

## 2020-11-03 ENCOUNTER — Telehealth: Payer: Self-pay

## 2020-11-03 NOTE — Telephone Encounter (Signed)
Att to contact pt to verify if she needed a CPAP machine because CPAP supply order has been received by [facility]

## 2020-11-09 ENCOUNTER — Other Ambulatory Visit: Payer: Self-pay | Admitting: Family

## 2020-11-09 DIAGNOSIS — G4733 Obstructive sleep apnea (adult) (pediatric): Secondary | ICD-10-CM

## 2020-11-27 ENCOUNTER — Telehealth: Payer: Self-pay | Admitting: Internal Medicine

## 2020-11-27 ENCOUNTER — Encounter: Payer: Self-pay | Admitting: Nurse Practitioner

## 2020-11-27 ENCOUNTER — Ambulatory Visit (INDEPENDENT_AMBULATORY_CARE_PROVIDER_SITE_OTHER): Payer: 59 | Admitting: Nurse Practitioner

## 2020-11-27 ENCOUNTER — Other Ambulatory Visit: Payer: Self-pay

## 2020-11-27 VITALS — Ht 64.0 in | Wt 260.0 lb

## 2020-11-27 DIAGNOSIS — Z6841 Body Mass Index (BMI) 40.0 and over, adult: Secondary | ICD-10-CM | POA: Diagnosis not present

## 2020-11-27 DIAGNOSIS — U071 COVID-19: Secondary | ICD-10-CM

## 2020-11-27 MED ORDER — MOLNUPIRAVIR EUA 200MG CAPSULE: 4.0000 | ORAL_CAPSULE | Freq: Two times a day (BID) | ORAL | 0 refills | Status: AC

## 2020-11-27 MED ORDER — MOLNUPIRAVIR EUA 200MG CAPSULE: 4.0000 | ORAL_CAPSULE | Freq: Two times a day (BID) | ORAL | 0 refills | Status: DC

## 2020-11-27 NOTE — Progress Notes (Signed)
Virtual Visit via Telephone Note  I connected with Heather Campos on 11/27/20 at 10:20 AM EDT by telephone and verified that I am speaking with the correct person using two identifiers.  Location: Patient: home Provider: office   I discussed the limitations, risks, security and privacy concerns of performing an evaluation and management service by telephone and the availability of in person appointments. I also discussed with the patient that there may be a patient responsible charge related to this service. The patient expressed understanding and agreed to proceed.   History of Present Illness:  Patient presents today for an acute visit through televisit.  She states that this morning she has been having severe headache, sore throat, nausea.  Patient did take a home COVID test that was positive.  Patient is vaccinated and has had her boosters.  Patient is obese.  BMI is 44.6.  Patient does qualify for oral COVID treatment.  We will order molnupiravir. Denies f/c/s, n/v/d, hemoptysis, PND, chest pain or edema.     Observations/Objective:  Vitals with BMI 10/21/2020 08/28/2020 05/15/2020  Height - 5' 4.016" -  Weight 269 lbs 13 oz 267 lbs 13 oz 258 lbs  BMI - 97.67 34.19  Systolic 379 024 097  Diastolic 94 84 92  Pulse 83 100 89  Some encounter information is confidential and restricted. Go to Review Flowsheets activity to see all data.      Assessment and Plan:  Covid 19 Cough:   Stay well hydrated  Stay active  Deep breathing exercises   May take tylenol or fever or pain  May take mucinex twice daily  You are being prescribed MOLNUPIRAVIR for COVID-19 infection.      Please call the pharmacy or go through the drive through vs going inside if you are picking up the mediation yourself to prevent further spread. If prescribed to a [facility] affiliated pharmacy, a pharmacist will bring the medication out to your car.   ADMINISTRATION INSTRUCTIONS: Take with or  without food. Swallow the tablets whole. Don't chew, crush, or break the medications because it might not work as well  For each dose of the medication, you should be taking FOUR tablets at one time, TWICE a day   Finish your full five-day course of Molnupiravir even if you feel better before you're done. Stopping this medication too early can make it less effective to prevent severe illness related to [place].    Molnupiravir is prescribed for YOU ONLY. Don't share it with others, even if they have similar symptoms as you. This medication might not be right for everyone.   Make sure to take steps to protect yourself and others while you're taking this medication in order to get well soon and to prevent others from getting sick with COVID-19.   **If you are of childbearing potential (any gender) - it is advised to not get pregnant while taking this medication and recommended that condoms are used for female partners the next 3 months after taking the medication out of extreme caution    COMMON SIDE EFFECTS: Diarrhea Nausea  Dizziness    If your COVID-19 symptoms get worse, get medical help right away. Call 911 if you experience symptoms such as worsening cough, trouble breathing, chest pain that doesn't go away, confusion, a hard time staying awake, and pale or blue-colored skin. This medication won't prevent all COVID-19 cases from getting worse.      Follow up:  Follow up in 2 weeks or sooner if  needed      I discussed the assessment and treatment plan with the patient. The patient was provided an opportunity to ask questions and all were answered. The patient agreed with the plan and demonstrated an understanding of the instructions.   The patient was advised to call back or seek an in-person evaluation if the symptoms worsen or if the condition fails to improve as anticipated.  I provided 23 minutes of non-face-to-face time during this encounter.   Fenton Foy, NP

## 2020-11-27 NOTE — Patient Instructions (Signed)
Covid 19 Cough:   Stay well hydrated  Stay active  Deep breathing exercises   May take tylenol or fever or pain  May take mucinex twice daily  You are being prescribed MOLNUPIRAVIR for COVID-19 infection.      Please call the pharmacy or go through the drive through vs going inside if you are picking up the mediation yourself to prevent further spread. If prescribed to a [facility] affiliated pharmacy, a pharmacist will bring the medication out to your car.   ADMINISTRATION INSTRUCTIONS: Take with or without food. Swallow the tablets whole. Don't chew, crush, or break the medications because it might not work as well  For each dose of the medication, you should be taking FOUR tablets at one time, TWICE a day   Finish your full five-day course of Molnupiravir even if you feel better before you're done. Stopping this medication too early can make it less effective to prevent severe illness related to [place].    Molnupiravir is prescribed for YOU ONLY. Don't share it with others, even if they have similar symptoms as you. This medication might not be right for everyone.   Make sure to take steps to protect yourself and others while you're taking this medication in order to get well soon and to prevent others from getting sick with COVID-19.   **If you are of childbearing potential (any gender) - it is advised to not get pregnant while taking this medication and recommended that condoms are used for female partners the next 3 months after taking the medication out of extreme caution    COMMON SIDE EFFECTS: Diarrhea Nausea  Dizziness    If your COVID-19 symptoms get worse, get medical help right away. Call 911 if you experience symptoms such as worsening cough, trouble breathing, chest pain that doesn't go away, confusion, a hard time staying awake, and pale or blue-colored skin. This medication won't prevent all COVID-19 cases from getting worse.      Follow  up:  Follow up in 2 weeks or sooner if needed

## 2020-11-27 NOTE — Telephone Encounter (Signed)
.  Heather Campos, Heather Campos are scheduled for a virtual visit with your provider today.    Just as we do with appointments in the office, we must obtain your consent to participate.  Your consent will be active for this visit and any virtual visit you may have with one of our providers in the next 365 days.    If you have a MyChart account, I can also send a copy of this consent to you electronically.  All virtual visits are billed to your insurance company just like a traditional visit in the office.  As this is a virtual visit, video technology does not allow for your provider to perform a traditional examination.  This may limit your provider's ability to fully assess your condition.  If your provider identifies any concerns that need to be evaluated in person or the need to arrange testing such as labs, EKG, etc, we will make arrangements to do so.    Although advances in technology are sophisticated, we cannot ensure that it will always work on either your end or our end.  If the connection with a video visit is poor, we may have to switch to a telephone visit.  With either a video or telephone visit, we are not always able to ensure that we have a secure connection.   I need to obtain your verbal consent now.   Are you willing to proceed with your visit today?   Heather Campos has provided verbal consent on 11/27/2020 for a virtual visit (video or telephone).   [name] 11/27/2020  9:48 AM

## 2020-12-21 ENCOUNTER — Other Ambulatory Visit: Payer: Self-pay | Admitting: *Deleted

## 2020-12-21 DIAGNOSIS — E038 Other specified hypothyroidism: Secondary | ICD-10-CM

## 2020-12-21 DIAGNOSIS — E063 Autoimmune thyroiditis: Secondary | ICD-10-CM

## 2020-12-24 ENCOUNTER — Ambulatory Visit: Payer: 59

## 2020-12-24 ENCOUNTER — Other Ambulatory Visit: Payer: Self-pay

## 2021-02-04 DIAGNOSIS — M25562 Pain in left knee: Secondary | ICD-10-CM | POA: Insufficient documentation

## 2021-03-04 NOTE — Progress Notes (Signed)
Patient ID: Heather Campos, female    DOB: [birth year]  MRN: [id number]  CC: Hypothyroidism Follow-Up   Subjective: Heather Campos is a 46 y.o. female who presents for hypothyroidism follow-up.   Her concerns today include:  HYPOTHYROIDISM FOLLOW-UP: 08/28/2020: - Continue Levothyroxine as prescribed.   03/05/2021: Doing well on current regimen, no issues/concerns.   2. FAMILY HISTORY LUPUS: Requesting routine screening.   3. VITAMIN D DEFICIENCY: Requesting routine screening.   Patient Active Problem List   Diagnosis Date Noted   COVID-19 11/27/2020   Class 3 severe obesity due to excess calories without serious comorbidity with body mass index (BMI) of 40.0 to 44.9 in adult [facility]) 11/27/2020   Insomnia 04/14/2020   Fibroid uterus 07/04/2019   Uterine fibroid 07/04/2019   Tachycardia 05/14/2019   History of COVID-19 05/13/2019   Hair loss 05/13/2019   OSA (obstructive sleep apnea) 02/09/2016   Family history of systemic lupus erythematosus 02/09/2016   Vitamin D deficiency 09/02/2015   Morbid obesity due to excess calories ([place]) 09/02/2015   Iron deficiency anemia due to chronic blood loss 09/02/2015   Hypothyroidism due to Hashimoto's thyroiditis 09/02/2015     Current Outpatient Medications on File Prior to Visit  Medication Sig Dispense Refill   clindamycin (CLEOCIN T) 1 % lotion Apply 1 application topically daily.      diazepam (VALIUM) 5 MG tablet Take 1 tablet (5 mg total) by mouth every 12 (twelve) hours as needed for anxiety. 10 tablet 0   gabapentin (NEURONTIN) 300 MG capsule Take 300 mg by mouth daily.     HYDROcodone-acetaminophen (NORCO) 10-325 MG tablet Take 1 tablet by mouth every 8 (eight) hours as needed.     levothyroxine (EUTHYROX) 137 MCG tablet Take 1 tablet (137 mcg total) by mouth daily. 90 tablet 0   omeprazole (PRILOSEC) 40 MG capsule TAKE 1 CAPSULE BY MOUTH ONCE DAILY AS NEEDED 90 capsule 0   traZODone (DESYREL) 50 MG tablet Take 1 tablet (50  mg total) by mouth at bedtime as needed for sleep. 90 tablet 1   Vitamin D, Ergocalciferol, (DRISDOL) 1.25 MG (50000 UNIT) CAPS capsule Take 1 capsule (50,000 Units total) by mouth every 7 (seven) days. 4 capsule 2   No current facility-administered medications on file prior to visit.    Allergies  Allergen Reactions   No Known Allergies     Social History   Socioeconomic History   Marital status: Married    Spouse name: Not on file   Number of children: Not on file   Years of education: Not on file   Highest education level: Not on file  Occupational History   Not on file  Tobacco Use   Smoking status: Former   Smokeless tobacco: Never  Vaping Use   Vaping Use: Never used  Substance and Sexual Activity   Alcohol use: No   Drug use: No   Sexual activity: Yes    Birth control/protection: None  Other Topics Concern   Not on file  Social History Narrative   Not on file   Social Determinants of Health   Financial Resource Strain: Not on file  Food Insecurity: Not on file  Transportation Needs: Not on file  Physical Activity: Not on file  Stress: Not on file  Social Connections: Not on file  Intimate Partner Violence: Not on file    Family History  Problem Relation Age of Onset   Lupus Mother    Kidney disease Mother  Lupus Cousin     Past Surgical History:  Procedure Laterality Date   ABDOMINAL HYSTERECTOMY     CESAREAN SECTION     CHOLECYSTECTOMY     CYSTOSCOPY  07/04/2019   Procedure: CYSTOSCOPY;  Surgeon: Sanjuana Kava, MD;  Location: [place];  Service: Gynecology;;   HYSTERECTOMY ABDOMINAL WITH SALPINGECTOMY Bilateral 07/04/2019   Procedure: ABDOMINAL  suprcervical hysterctomyWITH SALPINGECTOMY ;  Surgeon: Sanjuana Kava, MD;  Location: [place];  Service: Gynecology;  Laterality: Bilateral;   TOTAL LAPAROSCOPIC HYSTERECTOMY WITH SALPINGECTOMY Bilateral 07/04/2019   Procedure: attemted TOTAL LAPAROSCOPIC HYSTERECTOMY WITH  SALPINGECTOMY;  Surgeon: Sanjuana Kava, MD;  Location: [place];  Service: Gynecology;  Laterality: Bilateral;  3 hours    ROS: Review of Systems Negative except as stated above  PHYSICAL EXAM: BP 118/83 (BP Location: Left Arm, Patient Position: Sitting, Cuff Size: Large)    Pulse 75    Temp 98.3 F (36.8 C)    Resp 18    Ht 5' 4.02" (1.626 m)    Wt 260 lb (117.9 kg)    LMP 06/24/2019    SpO2 97%    BMI 44.61 kg/m   Physical Exam HENT:     Head: Normocephalic and atraumatic.  Eyes:     Extraocular Movements: Extraocular movements intact.     Conjunctiva/sclera: Conjunctivae normal.     Pupils: Pupils are equal, round, and reactive to light.  Cardiovascular:     Rate and Rhythm: Normal rate and regular rhythm.     Pulses: Normal pulses.     Heart sounds: Normal heart sounds.  Pulmonary:     Effort: Pulmonary effort is normal.     Breath sounds: Normal breath sounds.  Musculoskeletal:     Cervical back: Normal range of motion and neck supple.  Neurological:     General: No focal deficit present.     Mental Status: She is alert and oriented to person, place, and time.  Psychiatric:        Mood and Affect: Mood normal.        Behavior: Behavior normal.   ASSESSMENT AND PLAN: 1. Hypothyroidism due to Hashimoto's thyroiditis: - Will recheck thyroid function today. Will refill Levothyroxine and update plan of care once lab results.  - TSH  2. Family history of systemic lupus erythematosus: - Routine screening.  - ANA  3. Vitamin D deficiency: - Screening for vitamin D deficiency. - Vitamin D, 25-hydroxy  4. Need for immunization against influenza: - Administered today in office.  - Flu Vaccine QUAD 13mo+IM (Fluarix, Fluzone & Alfiuria Quad PF)   Patient was given the opportunity to ask questions.  Patient verbalized understanding of the plan and was able to repeat key elements of the plan. Patient was given clear instructions to go to Emergency Department  or return to medical center if symptoms don't improve, worsen, or new problems develop.The patient verbalized understanding.   Orders Placed This Encounter  Procedures   Flu Vaccine QUAD 12mo+IM (Fluarix, Fluzone & Alfiuria Quad PF)   TSH   Vitamin D, 25-hydroxy   ANA   Follow-up with primary provider as scheduled.   Camillia Herter, NP

## 2021-03-05 ENCOUNTER — Other Ambulatory Visit: Payer: Self-pay

## 2021-03-05 ENCOUNTER — Telehealth: Payer: Self-pay | Admitting: Family

## 2021-03-05 ENCOUNTER — Ambulatory Visit: Payer: No Typology Code available for payment source | Admitting: Family

## 2021-03-05 ENCOUNTER — Encounter: Payer: Self-pay | Admitting: Family

## 2021-03-05 VITALS — BP 118/83 | HR 75 | Temp 98.3°F | Resp 18 | Ht 64.02 in | Wt 260.0 lb

## 2021-03-05 DIAGNOSIS — E063 Autoimmune thyroiditis: Secondary | ICD-10-CM | POA: Diagnosis not present

## 2021-03-05 DIAGNOSIS — E559 Vitamin D deficiency, unspecified: Secondary | ICD-10-CM | POA: Diagnosis not present

## 2021-03-05 DIAGNOSIS — Z23 Encounter for immunization: Secondary | ICD-10-CM | POA: Diagnosis not present

## 2021-03-05 DIAGNOSIS — Z8269 Family history of other diseases of the musculoskeletal system and connective tissue: Secondary | ICD-10-CM

## 2021-03-05 DIAGNOSIS — E038 Other specified hypothyroidism: Secondary | ICD-10-CM

## 2021-03-05 NOTE — Telephone Encounter (Signed)
diazepam (VALIUM) 5 MG tablet [[id number]]    [facility] [address] [place], [place] [address]  Flying out and needs it by Monday 23rd of January  because she has  a flight  and needs that for that and forgot to ask for it in her appt.

## 2021-03-05 NOTE — Progress Notes (Signed)
Pt presents for hypothyroidism follow-up, pt wants Vit D checked

## 2021-03-06 ENCOUNTER — Encounter: Payer: Self-pay | Admitting: Family

## 2021-03-06 ENCOUNTER — Other Ambulatory Visit: Payer: Self-pay | Admitting: Family

## 2021-03-06 DIAGNOSIS — F40243 Fear of flying: Secondary | ICD-10-CM

## 2021-03-06 DIAGNOSIS — E038 Other specified hypothyroidism: Secondary | ICD-10-CM

## 2021-03-06 DIAGNOSIS — E559 Vitamin D deficiency, unspecified: Secondary | ICD-10-CM

## 2021-03-06 LAB — ANA: Anti Nuclear Antibody (ANA): NEGATIVE

## 2021-03-06 LAB — TSH: TSH: 2.72 u[IU]/mL (ref 0.450–4.500)

## 2021-03-06 LAB — VITAMIN D 25 HYDROXY (VIT D DEFICIENCY, FRACTURES): Vit D, 25-Hydroxy: 21.8 ng/mL — ABNORMAL LOW (ref 30.0–100.0)

## 2021-03-06 MED ORDER — LEVOTHYROXINE SODIUM 137 MCG PO TABS: 137.0000 ug | ORAL_TABLET | Freq: Every day | ORAL | 1 refills | Status: DC

## 2021-03-06 MED ORDER — VITAMIN D (ERGOCALCIFEROL) 1.25 MG (50000 UNIT) PO CAPS: 50000.0000 [IU] | ORAL_CAPSULE | ORAL | 2 refills | Status: DC

## 2021-03-06 MED ORDER — DIAZEPAM 5 MG PO TABS: 5.0000 mg | ORAL_TABLET | Freq: Two times a day (BID) | ORAL | 0 refills | Status: DC | PRN

## 2021-03-06 NOTE — Progress Notes (Signed)
Thyroid function normal. Continue Levothyroxine. Follow-up in 6 months.   Vitamin D lower than normal. Continue prescribed Vitamin D and recheck in 12 to 16 weeks.   ANA screening for lupus negative.

## 2021-03-08 ENCOUNTER — Other Ambulatory Visit: Payer: Self-pay

## 2021-03-08 DIAGNOSIS — K219 Gastro-esophageal reflux disease without esophagitis: Secondary | ICD-10-CM

## 2021-03-08 DIAGNOSIS — E038 Other specified hypothyroidism: Secondary | ICD-10-CM

## 2021-03-08 DIAGNOSIS — E559 Vitamin D deficiency, unspecified: Secondary | ICD-10-CM

## 2021-03-08 MED ORDER — LEVOTHYROXINE SODIUM 137 MCG PO TABS: 137.0000 ug | ORAL_TABLET | Freq: Every day | ORAL | 1 refills | Status: DC

## 2021-03-08 MED ORDER — OMEPRAZOLE 40 MG PO CPDR: 40.0000 mg | DELAYED_RELEASE_CAPSULE | Freq: Every day | ORAL | 0 refills | Status: DC | PRN

## 2021-03-08 MED ORDER — VITAMIN D (ERGOCALCIFEROL) 1.25 MG (50000 UNIT) PO CAPS: 50000.0000 [IU] | ORAL_CAPSULE | ORAL | 2 refills | Status: DC

## 2021-03-08 NOTE — Progress Notes (Signed)
Medications updated and sent to [facility]

## 2021-03-08 NOTE — Telephone Encounter (Signed)
Medication has been sent.  

## 2021-04-21 ENCOUNTER — Inpatient Hospital Stay: Payer: No Typology Code available for payment source | Attending: Hematology and Oncology

## 2021-04-21 ENCOUNTER — Other Ambulatory Visit: Payer: 59

## 2021-04-21 ENCOUNTER — Other Ambulatory Visit: Payer: Self-pay

## 2021-04-21 DIAGNOSIS — D472 Monoclonal gammopathy: Secondary | ICD-10-CM | POA: Insufficient documentation

## 2021-04-21 DIAGNOSIS — Z90710 Acquired absence of both cervix and uterus: Secondary | ICD-10-CM | POA: Insufficient documentation

## 2021-04-21 DIAGNOSIS — Z87891 Personal history of nicotine dependence: Secondary | ICD-10-CM | POA: Diagnosis not present

## 2021-04-21 LAB — CBC WITH DIFFERENTIAL (CANCER CENTER ONLY)
Abs Immature Granulocytes: 0.03 10*3/uL (ref 0.00–0.07)
Basophils Absolute: 0.1 10*3/uL (ref 0.0–0.1)
Basophils Relative: 1 %
Eosinophils Absolute: 0.2 10*3/uL (ref 0.0–0.5)
Eosinophils Relative: 2 %
HCT: 37.8 % (ref 36.0–46.0)
Hemoglobin: 12.9 g/dL (ref 12.0–15.0)
Immature Granulocytes: 0 %
Lymphocytes Relative: 42 %
Lymphs Abs: 3.9 10*3/uL (ref 0.7–4.0)
MCH: 27.8 pg (ref 26.0–34.0)
MCHC: 34.1 g/dL (ref 30.0–36.0)
MCV: 81.5 fL (ref 80.0–100.0)
Monocytes Absolute: 0.7 10*3/uL (ref 0.1–1.0)
Monocytes Relative: 7 %
Neutro Abs: 4.4 10*3/uL (ref 1.7–7.7)
Neutrophils Relative %: 48 %
Platelet Count: 271 10*3/uL (ref 150–400)
RBC: 4.64 MIL/uL (ref 3.87–5.11)
RDW: 13.7 % (ref 11.5–15.5)
WBC Count: 9.2 10*3/uL (ref 4.0–10.5)
nRBC: 0 % (ref 0.0–0.2)

## 2021-04-21 LAB — CMP (CANCER CENTER ONLY)
ALT: 16 U/L (ref 0–44)
AST: 14 U/L — ABNORMAL LOW (ref 15–41)
Albumin: 4 g/dL (ref 3.5–5.0)
Alkaline Phosphatase: 84 U/L (ref 38–126)
Anion gap: 7 (ref 5–15)
BUN: 8 mg/dL (ref 6–20)
CO2: 25 mmol/L (ref 22–32)
Calcium: 9 mg/dL (ref 8.9–10.3)
Chloride: 106 mmol/L (ref 98–111)
Creatinine: 0.81 mg/dL (ref 0.44–1.00)
GFR, Estimated: >60 mL/min (ref >60)
Glucose, Bld: 109 mg/dL — ABNORMAL HIGH (ref 70–99)
Potassium: 4.1 mmol/L (ref 3.5–5.1)
Sodium: 138 mmol/L (ref 135–145)
Total Bilirubin: 0.6 mg/dL (ref 0.3–1.2)
Total Protein: 7.7 g/dL (ref 6.5–8.1)

## 2021-04-21 LAB — LACTATE DEHYDROGENASE: LDH: 146 U/L (ref 98–192)

## 2021-04-22 LAB — KAPPA/LAMBDA LIGHT CHAINS
Kappa free light chain: 25.6 mg/L — ABNORMAL HIGH (ref 3.3–19.4)
Kappa, lambda light chain ratio: 1.33 (ref 0.26–1.65)
Lambda free light chains: 19.2 mg/L (ref 5.7–26.3)

## 2021-04-26 LAB — MULTIPLE MYELOMA PANEL, SERUM
Albumin SerPl Elph-Mcnc: 3.5 g/dL (ref 2.9–4.4)
Albumin/Glob SerPl: 1 (ref 0.7–1.7)
Alpha 1: 0.2 g/dL (ref 0.0–0.4)
Alpha2 Glob SerPl Elph-Mcnc: 0.7 g/dL (ref 0.4–1.0)
B-Globulin SerPl Elph-Mcnc: 1.2 g/dL (ref 0.7–1.3)
Gamma Glob SerPl Elph-Mcnc: 1.6 g/dL (ref 0.4–1.8)
Globulin, Total: 3.6 g/dL (ref 2.2–3.9)
IgA: 168 mg/dL (ref 87–352)
IgG (Immunoglobin G), Serum: 1531 mg/dL (ref 586–1602)
IgM (Immunoglobulin M), Srm: 25 mg/dL — ABNORMAL LOW (ref 26–217)
M Protein SerPl Elph-Mcnc: 0.5 g/dL — ABNORMAL HIGH
Total Protein ELP: 7.1 g/dL (ref 6.0–8.5)

## 2021-04-27 NOTE — Progress Notes (Signed)
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  2) 01/13/2020: establish care with Dr. Lorenso Courier. Kappa 18.3, Lambda 15.8, Ratio 1.16. M protein 0.6 3) 04/15/2020: M protein 0.5, Lambda 18.7, Kappa 21.8, ratio 1.17 4) 10/16/2020: WBC 10.3, Hgb 12.8, MCV 82.5, Plt 285. M protein 0.7, kappa 25.7, Lambda 18.9, ratio 1.36  Interval History:  Heather Campos 46 y.o. female with medical history significant for IgG Lambda MGUS who presents for a follow up visit. The patient's last visit was on 10/21/2020. In the interim since the last visit she has had no major changes in her health.   On exam today Heather Campos is accompanied by her mother-in-law.  She reports he has been well in the interim since her last visit in August.  She reports that her energy levels are good and her appetite is strong.  She denies any new bone or back pain though she is currently undergoing evaluation by orthopedics for arthritis in her left knee.  She was recently prescribed meloxicam which she has not yet started.  She notes that she does have inflammation in her knee but otherwise has been quite well.  She has not noticed any change in her urinary habits and reports no bubbling or change in the color of her urine.  She is at her baseline level of health.  She otherwise denies any fevers, chills, sweats, nausea, vomiting or diarrhea.  A full 10 point ROS is listed below.  MEDICAL HISTORY:  Past Medical History:  Diagnosis Date   Allergic rhinitis    Anxiety    Chronic back pain    Family history of systemic lupus erythematosus    GERD (gastroesophageal reflux disease)    Hypothyroidism due to Hashimoto's thyroiditis    IDA (iron  deficiency anemia)    Morbid obesity (HCC)    OSA (obstructive sleep apnea)    No CPAP   Vitamin D deficiency     SURGICAL HISTORY: Past Surgical History:  Procedure Laterality Date   ABDOMINAL HYSTERECTOMY     CESAREAN SECTION     CHOLECYSTECTOMY     CYSTOSCOPY  07/04/2019   Procedure: CYSTOSCOPY;  Surgeon: Sanjuana Kava, MD;  Location: [place];  Service: Gynecology;;   HYSTERECTOMY ABDOMINAL WITH SALPINGECTOMY Bilateral 07/04/2019   Procedure: ABDOMINAL  suprcervical hysterctomyWITH SALPINGECTOMY ;  Surgeon: Sanjuana Kava, MD;  Location: [place];  Service: Gynecology;  Laterality: Bilateral;   TOTAL LAPAROSCOPIC HYSTERECTOMY WITH SALPINGECTOMY Bilateral 07/04/2019   Procedure: attemted TOTAL LAPAROSCOPIC HYSTERECTOMY WITH SALPINGECTOMY;  Surgeon: Sanjuana Kava, MD;  Location: [place];  Service: Gynecology;  Laterality: Bilateral;  3 hours    SOCIAL HISTORY: Social History   Socioeconomic History   Marital status: Married    Spouse name: Not on file   Number of children: Not on file   Years of education: Not on file   Highest education level: Not on file  Occupational History   Not on file  Tobacco Use   Smoking status: Former   Smokeless tobacco: Never  Vaping Use   Vaping Use: Never used  Substance and Sexual Activity   Alcohol use: No   Drug use: No   Sexual activity: Yes    Birth control/protection: None  Other Topics Concern   Not on file  Social History Narrative   Not on file   Social Determinants of Health   Financial Resource Strain: Not on file  Food Insecurity: Not on file  Transportation Needs: Not on file  Physical Activity: Not on file  Stress: Not on file  Social Connections: Not on file  Intimate Partner Violence: Not on file    FAMILY HISTORY: Family History  Problem Relation Age of Onset   Lupus Mother    Kidney disease Mother    Lupus Cousin     ALLERGIES:  is allergic to no known  allergies.  MEDICATIONS:  Current Outpatient Medications  Medication Sig Dispense Refill   clindamycin (CLEOCIN T) 1 % lotion Apply 1 application topically daily.      diazepam (VALIUM) 5 MG tablet Take 1 tablet (5 mg total) by mouth every 12 (twelve) hours as needed for up to 2 doses for anxiety. 2 tablet 0   gabapentin (NEURONTIN) 300 MG capsule Take 300 mg by mouth daily.     HYDROcodone-acetaminophen (NORCO) 10-325 MG tablet Take 1 tablet by mouth every 8 (eight) hours as needed.     levothyroxine (EUTHYROX) 137 MCG tablet Take 1 tablet (137 mcg total) by mouth daily. 90 tablet 1   omeprazole (PRILOSEC) 40 MG capsule Take 1 capsule (40 mg total) by mouth daily as needed. 90 capsule 0   traZODone (DESYREL) 50 MG tablet Take 1 tablet (50 mg total) by mouth at bedtime as needed for sleep. 90 tablet 1   Vitamin D, Ergocalciferol, (DRISDOL) 1.25 MG (50000 UNIT) CAPS capsule Take 1 capsule (50,000 Units total) by mouth every 7 (seven) days. 4 capsule 2   No current facility-administered medications for this visit.    REVIEW OF SYSTEMS:   Constitutional: ( - ) fevers, ( - )  chills , ( - ) night sweats Eyes: ( - ) blurriness of vision, ( - ) double vision, ( - ) watery eyes Ears, nose, mouth, throat, and face: ( - ) mucositis, ( - ) sore throat Respiratory: ( - ) cough, ( - ) dyspnea, ( - ) wheezes Cardiovascular: ( - ) palpitation, ( - ) chest discomfort, ( - ) lower extremity swelling Gastrointestinal:  ( - ) nausea, ( - ) heartburn, ( - ) change in bowel habits Skin: ( - ) abnormal skin rashes Lymphatics: ( - ) new lymphadenopathy, ( - ) easy bruising Neurological: ( - ) numbness, ( - ) tingling, ( - ) new weaknesses Behavioral/Psych: ( - ) mood change, ( - ) new changes  All other systems were reviewed with the patient and are negative.  PHYSICAL EXAMINATION:  Vitals:   04/28/21 1113  BP: 120/80  Pulse: 89  Resp: 16  Temp: 98.5 F (36.9 C)  SpO2: 97%     Filed Weights    04/28/21 1113  Weight: 267 lb 8 oz (121.3 kg)     GENERAL: well appearing middle aged [place] American female alert, no distress and comfortable SKIN: skin color, texture, turgor are normal, no rashes or significant lesions EYES: conjunctiva are pink and non-injected, sclera clear LUNGS: clear to auscultation and percussion with normal breathing effort HEART: regular rate & rhythm and no murmurs and no lower extremity edema Musculoskeletal: no cyanosis of digits and no clubbing  PSYCH: alert & oriented x 3, fluent speech NEURO: no focal motor/sensory deficits  LABORATORY DATA:  I have reviewed the data as listed CBC Latest Ref  Rng & Units 04/21/2021 10/16/2020 04/15/2020  WBC 4.0 - 10.5 K/uL 9.2 10.3 7.8  Hemoglobin 12.0 - 15.0 g/dL 12.9 12.8 12.7  Hematocrit 36.0 - 46.0 % 37.8 37.8 37.9  Platelets 150 - 400 K/uL 271 285 241    CMP Latest Ref Rng & Units 04/21/2021 10/16/2020 04/15/2020  Glucose 70 - 99 mg/dL 109(H) 96 94  BUN 6 - 20 mg/dL _0 Creatinine 0.44 - 1.00 mg/dL 0.81 0.91 0.79  Sodium 135 - 145 mmol/L 138 140 138  Potassium 3.5 - 5.1 mmol/L 4.1 4.3 4.1  Chloride 98 - 111 mmol/L 106 104 105  CO2 22 - 32 mmol/L _1 Calcium 8.9 - 10.3 mg/dL 9.0 9.4 8.8(L)  Total Protein 6.5 - 8.1 g/dL 7.7 7.8 7.4  Total Bilirubin 0.3 - 1.2 mg/dL 0.6 0.9 0.7  Alkaline Phos 38 - 126 U/L 84 100 78  AST 15 - 41 U/L 14(L) 20 18  ALT 0 - 44 U/L _2 Lab Results  Component Value Date   MPROTEIN 0.5 (H) 04/21/2021   MPROTEIN 0.7 (H) 10/16/2020   MPROTEIN 0.5 (H) 04/15/2020   Lab Results  Component Value Date   KPAFRELGTCHN 25.6 (H) 04/21/2021   KPAFRELGTCHN 25.7 (H) 10/16/2020   KPAFRELGTCHN 21.8 (H) 04/15/2020   LAMBDASER 19.2 04/21/2021   LAMBDASER 18.9 10/16/2020   LAMBDASER 18.7 04/15/2020   KAPLAMBRATIO 1.33 04/21/2021   KAPLAMBRATIO 1.36 10/16/2020   KAPLAMBRATIO 1.17 04/15/2020    RADIOGRAPHIC STUDIES: No results found.   ASSESSMENT & PLAN Heather Campos  46 y.o. female with medical history significant for IgG Lambda MGUS who presents for a follow up visit.   After review the labs, the records, discussion with the patient the findings most consistent with an IgG lambda monoclonal gammopathy of undetermined significance.  The patient does not have any of the high risk features that would prompt [place] to perform a bone marrow biopsy.  She has an M protein less than 1.5 which is of IgG specificity.  Her kappa and lambda light chains are also within normal limits with a normal ratio.  Given these findings I would recommend routine follow-up in approximately 6 months time.  Recommend that we obtain yearly UPEP and metastatic surveys.  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  This appears to be stable. -- Other labs show white blood cell count 9.2, hemoglobin 12.9, MCV 81.5, and platelet count of 271 --recommend a metastatic bone survey to be repeated yearly. Last one performed in March 2022 --assure yearly UPEP (will order this today) -- No indication for a bone marrow biopsy at this time.  --RTC in 6 months or sooner if indicated by the above labs.   Orders Placed This Encounter  Procedures   DG Bone Survey Met    Standing Status:   Future    Standing Expiration Date:   04/29/2022    Order Specific Question:   Reason for Exam (SYMPTOM  OR DIAGNOSIS REQUIRED)    Answer:   MGUS, asses for lytic lesions    Order Specific Question:   Is patient pregnant?    Answer:   No    Order Specific Question:   Preferred imaging location?    Answer:   [facility]   24-Hr Ur UPEP/UIFE/Light Chains/TP    Standing Status:  Future    Standing Expiration Date:   04/28/2022   All questions were answered. The patient knows to call the clinic with any problems, questions or concerns.  A total of more than 30 minutes  were spent on this encounter and over half of that time was spent on counseling and coordination of care as outlined above.   Ledell Peoples, MD Department of Hematology/Oncology [place] [facility] Phone: [phone] Pager: [phone] Email: [email or web address]  04/28/2021 11:35 AM

## 2021-04-28 ENCOUNTER — Inpatient Hospital Stay: Payer: No Typology Code available for payment source | Admitting: Hematology and Oncology

## 2021-04-28 ENCOUNTER — Other Ambulatory Visit: Payer: Self-pay

## 2021-04-28 VITALS — BP 120/80 | HR 89 | Temp 98.5°F | Resp 16 | Wt 267.5 lb

## 2021-04-28 DIAGNOSIS — D472 Monoclonal gammopathy: Secondary | ICD-10-CM

## 2021-05-04 ENCOUNTER — Ambulatory Visit (HOSPITAL_COMMUNITY): Payer: No Typology Code available for payment source

## 2021-05-04 ENCOUNTER — Telehealth: Payer: Self-pay | Admitting: Family

## 2021-05-04 ENCOUNTER — Encounter: Payer: Self-pay | Admitting: Family

## 2021-05-04 NOTE — Telephone Encounter (Signed)
[employer] contacted to check status-per Jeneen Rinks pending insurance verification and pt will be contacted once completed. Pt has been contacted and advised of the status  ?

## 2021-05-04 NOTE — Telephone Encounter (Signed)
Patient called stating that she had sent a MyChart message earlier today in regards to trying to get a new CPAP machine. She found a medical supply place in [place] that she was told she could get one from . She included their fax # in her message. Their phone # is [phone], [facility]. She said that the lady there told her that if we faxed over her sleep apnea info then they can get her a machine. If more info is needed as to what needs to be faxed, please call supplier. ?

## 2021-05-10 NOTE — Telephone Encounter (Signed)
Pt states she wants [facility] to get her cpap from. They need ?Copy of sleep study ?Prescription w/ pressure settings ? ?Pt states whatever was sent to other company, please send to them ? ?Fax [phone] ? ?

## 2021-05-10 NOTE — Telephone Encounter (Signed)
Sleep study from 04/07/20 and insurance demographics faxed to pt request of [facility], we do not have a prescription to send for pressure settings, pt originally referred to [place] which would have been taking care of all CPAP supplies  ?

## 2021-05-17 ENCOUNTER — Other Ambulatory Visit: Payer: Self-pay | Admitting: Family

## 2021-05-17 DIAGNOSIS — K219 Gastro-esophageal reflux disease without esophagitis: Secondary | ICD-10-CM

## 2021-05-17 NOTE — Telephone Encounter (Signed)
Pt called back to report that her sleep apnea machine is missing important information. She says they need her PCP to send in an order including the settings.  ?4-20 CM H20 settings automatic  ?Fax: [phone] (no specific attn:)  ?

## 2021-05-20 NOTE — Telephone Encounter (Signed)
Spoke to pt stated she will call [facility] and advise them that we don't have any info of what CPAP settings should be ?

## 2021-06-03 ENCOUNTER — Ambulatory Visit: Payer: Self-pay | Admitting: *Deleted

## 2021-06-03 ENCOUNTER — Telehealth: Payer: Self-pay | Admitting: Family

## 2021-06-03 ENCOUNTER — Encounter: Payer: Self-pay | Admitting: Family Medicine

## 2021-06-03 NOTE — Telephone Encounter (Signed)
Copied from [place] [id number]. Topic: General - Other ?>> Jun 03, 2021  3:56 PM Pawlus, Brayton Layman A wrote: ?Reason for CRM: Pt spoke with NT today 4/13 and was requesting a call back with an update, please see NT TE. ?

## 2021-06-03 NOTE — Telephone Encounter (Signed)
Letter has been completed by Redmond Pulling, MD  and available for pickup  ?

## 2021-06-03 NOTE — Telephone Encounter (Signed)
Summary: Pt stated she has a medical situation but declined to provide more details  ? Pt requests that a nurse return her call. Pt stated she has a medical situation that she needs to discuss with the nurse. Pt declined to provide more details. Cb# [phone]  ?  ? ?Chief Complaint: Requesting work note ?Symptoms:  ?Frequency:  ?Pertinent Negatives: Patient denies  ?Disposition: '[]'$ ED /'[]'$ Urgent Care (no appt availability in office) / '[]'$ Appointment(In office/virtual)/ '[]'$  [place] [facility]/ '[]'$ Home Care/ '[]'$ Refused Recommended Disposition /'[]'$ [place] [facility]/ '[x]'$  Follow-up with PCP ?Additional Notes: Pt states her mother is in hospital. Requesting work note for today, Friday and Saturday, return to work Sunday. Pt does states her BP is elevated "Nerves torn up" nauseated.  Pt's mother presently being transferred to [facility] and pt unable to discuss further. Unable to triage pt as transporting mother now. Assured pt NT would route to practice for PCPs review of work note. Advised to CB if needed. ?Reason for Disposition ? [1] Caller requesting NON-URGENT health information AND [2] PCP's office is the best resource ? ?Answer Assessment - Initial Assessment Questions ?1. REASON FOR CALL or QUESTION: "What is your reason for calling today?" or "How can I best help you?" or "What question do you have that I can help answer?" ?    Work note requested ? ?Protocols used: Information Only Call - No Triage-A-AH ? ?

## 2021-06-11 NOTE — Telephone Encounter (Signed)
Note was completed for pt by Redmond Pulling, MD  ?

## 2021-06-23 DIAGNOSIS — G5603 Carpal tunnel syndrome, bilateral upper limbs: Secondary | ICD-10-CM | POA: Insufficient documentation

## 2021-06-29 DIAGNOSIS — M47816 Spondylosis without myelopathy or radiculopathy, lumbar region: Secondary | ICD-10-CM | POA: Insufficient documentation

## 2021-08-16 ENCOUNTER — Other Ambulatory Visit: Payer: Self-pay | Admitting: Family

## 2021-08-16 DIAGNOSIS — E038 Other specified hypothyroidism: Secondary | ICD-10-CM

## 2021-08-17 DIAGNOSIS — G8929 Other chronic pain: Secondary | ICD-10-CM | POA: Insufficient documentation

## 2021-08-20 DIAGNOSIS — E039 Hypothyroidism, unspecified: Secondary | ICD-10-CM | POA: Insufficient documentation

## 2021-09-03 ENCOUNTER — Ambulatory Visit
Admission: EM | Admit: 2021-09-03 | Discharge: 2021-09-03 | Disposition: A | Discharge status: Home / Self Care | Payer: No Typology Code available for payment source | Attending: Student | Admitting: Student

## 2021-09-03 DIAGNOSIS — H66005 Acute suppurative otitis media without spontaneous rupture of ear drum, recurrent, left ear: Secondary | ICD-10-CM

## 2021-09-03 MED ORDER — CEFDINIR 300 MG PO CAPS: 300.0000 mg | ORAL_CAPSULE | Freq: Two times a day (BID) | ORAL | 0 refills | Status: AC

## 2021-09-03 NOTE — Discharge Instructions (Addendum)
-  Omnicef twice daily x10 days -Follow-up with ENT if symptoms persist

## 2021-09-03 NOTE — ED Provider Notes (Signed)
[facility]    CSN: [id number] Arrival date & time: 09/03/21  0803      History   Chief Complaint Chief Complaint  Patient presents with   Otalgia    HPI Heather Campos is a 46 y.o. female presenting with left ear pain for approximately 2 weeks.  Symptoms improved following drops prescribed by PCP, but then returned.  Describes muffled hearing and discomfort.  Denies dizziness, tinnitus, drainage.  Denies recent URI, but states that she has had recurrent otitis media since she had COVID months ago.  HPI  Past Medical History:  Diagnosis Date   Allergic rhinitis    Anxiety    Chronic back pain    Family history of systemic lupus erythematosus    GERD (gastroesophageal reflux disease)    Hypothyroidism due to Hashimoto's thyroiditis    IDA (iron deficiency anemia)    Morbid obesity (HCC)    OSA (obstructive sleep apnea)    No CPAP   Vitamin D deficiency     Patient Active Problem List   Diagnosis Date Noted   COVID-19 11/27/2020   Class 3 severe obesity due to excess calories without serious comorbidity with body mass index (BMI) of 40.0 to 44.9 in adult [facility]) 11/27/2020   Insomnia 04/14/2020   Fibroid uterus 07/04/2019   Uterine fibroid 07/04/2019   Tachycardia 05/14/2019   History of COVID-19 05/13/2019   Hair loss 05/13/2019   OSA (obstructive sleep apnea) 02/09/2016   Family history of systemic lupus erythematosus 02/09/2016   Vitamin D deficiency 09/02/2015   Morbid obesity due to excess calories ([place]) 09/02/2015   Iron deficiency anemia due to chronic blood loss 09/02/2015   Hypothyroidism due to Hashimoto's thyroiditis 09/02/2015    Past Surgical History:  Procedure Laterality Date   ABDOMINAL HYSTERECTOMY     CESAREAN SECTION     CHOLECYSTECTOMY     CYSTOSCOPY  07/04/2019   Procedure: CYSTOSCOPY;  Surgeon: Sanjuana Kava, MD;  Location: [place];  Service: Gynecology;;   HYSTERECTOMY ABDOMINAL WITH SALPINGECTOMY Bilateral  07/04/2019   Procedure: ABDOMINAL  suprcervical hysterctomyWITH SALPINGECTOMY ;  Surgeon: Sanjuana Kava, MD;  Location: [place];  Service: Gynecology;  Laterality: Bilateral;   TOTAL LAPAROSCOPIC HYSTERECTOMY WITH SALPINGECTOMY Bilateral 07/04/2019   Procedure: attemted TOTAL LAPAROSCOPIC HYSTERECTOMY WITH SALPINGECTOMY;  Surgeon: Sanjuana Kava, MD;  Location: [place];  Service: Gynecology;  Laterality: Bilateral;  3 hours    OB History   No obstetric history on file.      Home Medications    Prior to Admission medications   Medication Sig Start Date End Date Taking? Authorizing Provider  cefdinir (OMNICEF) 300 MG capsule Take 1 capsule (300 mg total) by mouth 2 (two) times daily for 10 days. 09/03/21 09/13/21 Yes Hazel Sams, PA-C  clindamycin (CLEOCIN T) 1 % lotion Apply 1 application topically daily.  06/03/19   [provider]  diazepam (VALIUM) 5 MG tablet Take 1 tablet (5 mg total) by mouth every 12 (twelve) hours as needed for up to 2 doses for anxiety. 03/12/21   Camillia Herter, NP  gabapentin (NEURONTIN) 300 MG capsule Take 300 mg by mouth daily. 12/13/19   [provider]  HYDROcodone-acetaminophen (NORCO) 10-325 MG tablet Take 1 tablet by mouth every 8 (eight) hours as needed. 11/11/19   [provider]  levothyroxine (SYNTHROID) 137 MCG tablet TAKE 1 TABLET BY MOUTH EVERY DAY 08/16/21   Camillia Herter, NP  omeprazole ([place])  40 MG capsule TAKE 1 CAPSULE BY MOUTH DAILY AS NEEDED. 05/18/21   Camillia Herter, NP  traZODone (DESYREL) 50 MG tablet Take 1 tablet (50 mg total) by mouth at bedtime as needed for sleep. 05/15/20   Nicolette Bang, MD  Vitamin D, Ergocalciferol, (DRISDOL) 1.25 MG (50000 UNIT) CAPS capsule Take 1 capsule (50,000 Units total) by mouth every 7 (seven) days. 03/08/21   Camillia Herter, NP    Family History Family History  Problem Relation Age of Onset   Lupus Mother    Kidney disease  Mother    Lupus Cousin     Social History Social History   Tobacco Use   Smoking status: Former   Smokeless tobacco: Never  Scientific laboratory technician Use: Never used  Substance Use Topics   Alcohol use: No   Drug use: No     Allergies   No known allergies   Review of Systems Review of Systems  HENT:  Positive for ear pain.   All other systems reviewed and are negative.    Physical Exam Triage Vital Signs ED Triage Vitals [09/03/21 0819]  Enc Vitals Group     BP (!) 148/97     Pulse Rate 92     Resp 18     Temp 98.3 F (36.8 C)     Temp Source Oral     SpO2 95 %     Weight      Height      Head Circumference      Peak Flow      Pain Score 5     Pain Loc      Pain Edu?      Excl. in [place]?    No data found.  Updated Vital Signs BP (!) 148/97 (BP Location: Left Arm)   Pulse 92   Temp 98.3 F (36.8 C) (Oral)   Resp 18   LMP 06/24/2019   SpO2 95%   Visual Acuity Right Eye Distance:   Left Eye Distance:   Bilateral Distance:    Right Eye Near:   Left Eye Near:    Bilateral Near:     Physical Exam Vitals reviewed.  Constitutional:      General: She is not in acute distress.    Appearance: Normal appearance. She is not ill-appearing.  HENT:     Head: Normocephalic and atraumatic.     Right Ear: Hearing, tympanic membrane, ear canal and external ear normal. No tenderness. No middle ear effusion. There is no impacted cerumen. No foreign body. No mastoid tenderness. Tympanic membrane is not injected, scarred, perforated, erythematous, retracted or bulging.     Left Ear: Hearing, ear canal and external ear normal. No tenderness.  No middle ear effusion. There is no impacted cerumen. No foreign body. No mastoid tenderness. Tympanic membrane is injected. Tympanic membrane is not scarred, perforated, erythematous, retracted or bulging.  Pulmonary:     Effort: Pulmonary effort is normal.  Neurological:     General: No focal deficit present.     Mental Status:  She is alert and oriented to person, place, and time.  Psychiatric:        Mood and Affect: Mood normal.        Behavior: Behavior normal.        Thought Content: Thought content normal.        Judgment: Judgment normal.      UC Treatments / Results  Labs (all labs ordered are  listed, but only abnormal results are displayed) Labs Reviewed - No data to display  EKG   Radiology No results found.  Procedures Procedures (including critical care time)  Medications Ordered in UC Medications - No data to display  Initial Impression / Assessment and Plan / UC Course  I have reviewed the triage vital signs and the nursing notes.  Pertinent labs & imaging results that were available during my care of the patient were reviewed by me and considered in my medical decision making (see chart for details).     This patient is a very pleasant 46 y.o. year old female presenting with L AOM. Afebrile, nontachy. Recently completed drops for otitis externa. Today, the L TM is injected. Will proceed with omnicef. ED return precautions discussed. Patient verbalizes understanding and agreement.   Final Clinical Impressions(s) / UC Diagnoses   Final diagnoses:  Recurrent acute suppurative otitis media without spontaneous rupture of left tympanic membrane     Discharge Instructions      -Omnicef twice daily x10 days -Follow-up with ENT if symptoms persist    ED Prescriptions     Medication Sig Dispense Auth. Provider   cefdinir (OMNICEF) 300 MG capsule Take 1 capsule (300 mg total) by mouth 2 (two) times daily for 10 days. 20 capsule Hazel Sams, PA-C      PDMP not reviewed this encounter.   Hazel Sams, PA-C 09/03/21 [phone]

## 2021-09-03 NOTE — ED Triage Notes (Signed)
Pt present left ear pain, symptom started two week ago. Pt was recently prescribed ear drops  two weeks ago but her ear has not gotten better.

## 2021-09-15 ENCOUNTER — Emergency Department (HOSPITAL_COMMUNITY)
Admission: EM | Admit: 2021-09-15 | Discharge: 2021-09-15 | Disposition: A | Discharge status: Home / Self Care | Payer: No Typology Code available for payment source | Attending: Emergency Medicine | Admitting: Emergency Medicine

## 2021-09-15 ENCOUNTER — Encounter (HOSPITAL_COMMUNITY): Payer: Self-pay

## 2021-09-15 DIAGNOSIS — H6692 Otitis media, unspecified, left ear: Secondary | ICD-10-CM | POA: Diagnosis not present

## 2021-09-15 DIAGNOSIS — H9202 Otalgia, left ear: Secondary | ICD-10-CM | POA: Diagnosis present

## 2021-09-15 DIAGNOSIS — H6690 Otitis media, unspecified, unspecified ear: Secondary | ICD-10-CM

## 2021-09-15 MED ORDER — CARBAMIDE PEROXIDE 6.5 % OT SOLN
5.0000 [drp] | Freq: Two times a day (BID) | OTIC | Status: DC
  Administered 2021-09-15: 5 [drp] via OTIC
  Filled 2021-09-15: qty 15

## 2021-09-15 MED ORDER — AMOXICILLIN-POT CLAVULANATE 875-125 MG PO TABS: 1.0000 | ORAL_TABLET | Freq: Two times a day (BID) | ORAL | 0 refills | Status: DC

## 2021-09-15 MED ORDER — AMOXICILLIN-POT CLAVULANATE 875-125 MG PO TABS: 1.0000 | ORAL_TABLET | Freq: Two times a day (BID) | ORAL | 0 refills | Status: AC

## 2021-09-15 NOTE — ED Triage Notes (Signed)
Pt states she has been having left sided ear pain since June. Pt has completed 2 abx without relief.

## 2021-09-15 NOTE — Discharge Instructions (Addendum)
Please start taking antibiotics. Please call ENT, Dr. Janace Hoard and try to get earlier appointment.

## 2021-09-15 NOTE — ED Provider Triage Note (Signed)
Emergency Medicine Provider Triage Evaluation Note  Heather Campos , a 46 y.o. female  was evaluated in triage.  Pt complains of left ear pain since mid June.  She states that she was originally seen by an urgent care and they told her that she had external ear infection and she was placed on eardrops.  She says the symptoms got little bit better following this, but quickly returned when she stopped the antibiotic drops.  She was then seen again by another provider who prescribed her [place] for possible ear infection.  She has an ENT appointment in 2 weeks.  She states that the [facility] did not help at all, and she feels like her symptoms are worsening.  She feels like she has muffled hearing, and pain along the external side of her ear.  She denies any tinnitus, fevers, or other symptoms.  Review of Systems  Positive: Ear pain Negative:   Physical Exam  BP (!) 151/98 (BP Location: Left Arm)   Pulse 98   Temp 99.8 F (37.7 C) (Oral)   Resp 18   Ht '5\' 4"'$  (1.626 m)   Wt 113.4 kg   LMP 06/24/2019   SpO2 99%   BMI 42.91 kg/m  Gen:   Awake, no distress   Resp:  Normal effort  MSK:   Moves extremities without difficulty  Other:  Left TM looks like it has a deep wax impaction.  Tympanic membrane is partially visualized but did not look erythematous.  She had some minimal tenderness to palpation of the external ear.  There is no mastoid fluctuance or pain to tenderness behind the ear.  Medical Decision Making  Medically screening exam initiated at 6:31 PM.  Appropriate orders placed.  Heather Campos was informed that the remainder of the evaluation will be completed by another provider, this initial triage assessment does not replace that evaluation, and the importance of remaining in the ED until their evaluation is complete.     Adolphus Birchwood, [place] 09/15/21 [phone]

## 2021-09-15 NOTE — ED Provider Notes (Signed)
[facility] DEPT Provider Note   CSN: [id number] Arrival date & time: 09/15/21  1747     History PMH: Obesity, OSA,  Chief Complaint  Patient presents with   Otalgia    Heather Campos is a 46 y.o. female. Pt complains of left ear pain since mid June.  She states that she was originally seen by an urgent care and they told her that she had external ear infection and she was placed on eardrops.  She says the symptoms got little bit better following this, but quickly returned when she stopped the antibiotic drops.  She was then seen again by another provider who prescribed her Cochran for possible ear infection.  She has an ENT appointment in 2 weeks.  She states that the [facility] did not help at all, and she feels like her symptoms are worsening.  She feels like she has muffled hearing, and pain along the external side of her ear.  She denies any tinnitus, fevers, or other symptoms.   Otalgia Associated symptoms: no fever        Home Medications Prior to Admission medications   Medication Sig Start Date End Date Taking? Authorizing Provider  amoxicillin-clavulanate (AUGMENTIN) 875-125 MG tablet Take 1 tablet by mouth 2 (two) times daily for 10 days. 09/15/21 09/25/21  Markell Sciascia, Adora Fridge, PA-C  clindamycin (CLEOCIN T) 1 % lotion Apply 1 application topically daily.  06/03/19   [provider]  diazepam (VALIUM) 5 MG tablet Take 1 tablet (5 mg total) by mouth every 12 (twelve) hours as needed for up to 2 doses for anxiety. 03/12/21   Camillia Herter, NP  gabapentin (NEURONTIN) 300 MG capsule Take 300 mg by mouth daily. 12/13/19   [provider]  HYDROcodone-acetaminophen (NORCO) 10-325 MG tablet Take 1 tablet by mouth every 8 (eight) hours as needed. 11/11/19   [provider]  levothyroxine (SYNTHROID) 137 MCG tablet TAKE 1 TABLET BY MOUTH EVERY DAY 08/16/21   Camillia Herter, NP  omeprazole (PRILOSEC) 40 MG capsule TAKE 1 CAPSULE BY  MOUTH DAILY AS NEEDED. 05/18/21   Camillia Herter, NP  traZODone (DESYREL) 50 MG tablet Take 1 tablet (50 mg total) by mouth at bedtime as needed for sleep. 05/15/20   Nicolette Bang, MD  Vitamin D, Ergocalciferol, (DRISDOL) 1.25 MG (50000 UNIT) CAPS capsule Take 1 capsule (50,000 Units total) by mouth every 7 (seven) days. 03/08/21   Camillia Herter, NP      Allergies    No known allergies    Review of Systems   Review of Systems  Constitutional:  Negative for chills and fever.  HENT:  Positive for ear pain.   All other systems reviewed and are negative.   Physical Exam Updated Vital Signs BP (!) 150/80 (BP Location: Left Arm)   Pulse 90   Temp 99.6 F (37.6 C) (Oral)   Resp 18   Ht '5\' 4"'$  (1.626 m)   Wt 113.4 kg   LMP 06/24/2019   SpO2 100%   BMI 42.91 kg/m  Physical Exam Vitals and nursing note reviewed.  Constitutional:      General: She is not in acute distress.    Appearance: Normal appearance. She is well-developed. She is not ill-appearing, toxic-appearing or diaphoretic.  HENT:     Head: Normocephalic and atraumatic.     Ears:     Comments: Left TM looks like it has a deep wax impaction.  Tympanic membrane is partially visualized but  did not look erythematous.  She had some minimal tenderness to palpation of the external ear.  There is no mastoid fluctuance or pain to tenderness behind the ear.     Nose: No nasal deformity.     Mouth/Throat:     Lips: Pink. No lesions.  Eyes:     General: Gaze aligned appropriately. No scleral icterus.       Right eye: No discharge.        Left eye: No discharge.     Conjunctiva/sclera: Conjunctivae normal.     Right eye: Right conjunctiva is not injected. No exudate or hemorrhage.    Left eye: Left conjunctiva is not injected. No exudate or hemorrhage. Pulmonary:     Effort: Pulmonary effort is normal. No respiratory distress.  Skin:    General: Skin is warm and dry.  Neurological:     Mental Status: She is alert  and oriented to person, place, and time.  Psychiatric:        Mood and Affect: Mood normal.        Speech: Speech normal.        Behavior: Behavior normal. Behavior is cooperative.     ED Results / Procedures / Treatments   Labs (all labs ordered are listed, but only abnormal results are displayed) Labs Reviewed - No data to display  EKG None  Radiology No results found.  Procedures Procedures   Medications Ordered in ED Medications  carbamide peroxide (DEBROX) 6.5 % OTIC (EAR) solution 5 drop (5 drops Left EAR Given 09/15/21 2026)    ED Course/ Medical Decision Making/ A&P                           Medical Decision Making Risk OTC drugs. Prescription drug management.   Patient presents with recurrent otalgia in the setting of presumed otitis media.  She is in no acute distress and is hemodynamically stable with no fever. He originally noticed the symptoms in mid June and she was prescribed otic drops with some relief but the symptoms recurred.  She has recently been on Omnicef and got no improvement. On exam today she did have a partial impaction noted.  We used Debrox drops and tried to irrigate this out. On reassessment I do note that tympanic membrane appears very erythematous.  It is not ruptured. On exam, there are no signs of mastoiditis, otitis externa, or malignant otitis externa. Will prescribed Augmentin.  Plan to have patient follow up with ENT sooner than her original appointment date.   Final Clinical Impression(s) / ED Diagnoses Final diagnoses:  Subacute otitis media, unspecified otitis media type    Rx / DC Orders ED Discharge Orders          Ordered    amoxicillin-clavulanate (AUGMENTIN) 875-125 MG tablet  Every 12 hours,   Status:  Discontinued        09/15/21 2159    amoxicillin-clavulanate (AUGMENTIN) 875-125 MG tablet  2 times daily        09/15/21 2200              Mackenna Kamer, Adora Fridge, PA-C 09/15/21 [place], [place],  DO 09/15/21 2225

## 2021-09-15 NOTE — ED Notes (Signed)
I provided reinforced discharge education based off of discharge summary/care provided. Pt acknowledged and understood my education. Pt had no further questions/concerns for provider/myself.   

## 2021-09-19 NOTE — Progress Notes (Signed)
Erroneous encounter-disregard

## 2021-09-21 ENCOUNTER — Other Ambulatory Visit: Payer: Self-pay | Admitting: Family

## 2021-09-21 DIAGNOSIS — E038 Other specified hypothyroidism: Secondary | ICD-10-CM

## 2021-09-21 NOTE — Telephone Encounter (Signed)
Ordered 08/16/2021 for 6 month supply. Call patient to determine medical necessity of early refills request.

## 2021-09-28 ENCOUNTER — Encounter: Payer: No Typology Code available for payment source | Admitting: Family

## 2021-09-28 DIAGNOSIS — H66005 Acute suppurative otitis media without spontaneous rupture of ear drum, recurrent, left ear: Secondary | ICD-10-CM

## 2021-09-29 ENCOUNTER — Encounter (INDEPENDENT_AMBULATORY_CARE_PROVIDER_SITE_OTHER): Payer: Self-pay

## 2021-09-30 DIAGNOSIS — H6122 Impacted cerumen, left ear: Secondary | ICD-10-CM | POA: Insufficient documentation

## 2021-10-22 ENCOUNTER — Inpatient Hospital Stay: Payer: No Typology Code available for payment source | Attending: Hematology and Oncology

## 2021-10-28 ENCOUNTER — Telehealth (HOSPITAL_BASED_OUTPATIENT_CLINIC_OR_DEPARTMENT_OTHER): Payer: Self-pay | Admitting: Hematology and Oncology

## 2021-10-28 NOTE — Telephone Encounter (Signed)
Scheduled per 9/7 in basket, message has been left

## 2021-10-29 ENCOUNTER — Other Ambulatory Visit: Payer: Self-pay | Admitting: Hematology and Oncology

## 2021-10-29 ENCOUNTER — Inpatient Hospital Stay: Payer: No Typology Code available for payment source

## 2021-10-29 ENCOUNTER — Inpatient Hospital Stay: Payer: No Typology Code available for payment source | Admitting: Hematology and Oncology

## 2021-10-29 DIAGNOSIS — D472 Monoclonal gammopathy: Secondary | ICD-10-CM

## 2021-11-03 NOTE — Progress Notes (Deleted)
Patient ID: Heather Campos, female    DOB: [birth year]  MRN: [id number]  CC: Chronic Care Management  Subjective: Heather Campos is a 46 y.o. female who presents for chronic care management.   Her concerns today include:  Levothyroxine Omeprazole     Patient Active Problem List   Diagnosis Date Noted   COVID-19 11/27/2020   Class 3 severe obesity due to excess calories without serious comorbidity with body mass index (BMI) of 40.0 to 44.9 in adult [facility]) 11/27/2020   Insomnia 04/14/2020   Fibroid uterus 07/04/2019   Uterine fibroid 07/04/2019   Tachycardia 05/14/2019   History of COVID-19 05/13/2019   Hair loss 05/13/2019   OSA (obstructive sleep apnea) 02/09/2016   Family history of systemic lupus erythematosus 02/09/2016   Vitamin D deficiency 09/02/2015   Morbid obesity due to excess calories ([place]) 09/02/2015   Iron deficiency anemia due to chronic blood loss 09/02/2015   Hypothyroidism due to Hashimoto's thyroiditis 09/02/2015     Current Outpatient Medications on File Prior to Visit  Medication Sig Dispense Refill   clindamycin (CLEOCIN T) 1 % lotion Apply 1 application topically daily.      diazepam (VALIUM) 5 MG tablet Take 1 tablet (5 mg total) by mouth every 12 (twelve) hours as needed for up to 2 doses for anxiety. 2 tablet 0   gabapentin (NEURONTIN) 300 MG capsule Take 300 mg by mouth daily.     HYDROcodone-acetaminophen (NORCO) 10-325 MG tablet Take 1 tablet by mouth every 8 (eight) hours as needed.     levothyroxine (SYNTHROID) 137 MCG tablet TAKE 1 TABLET BY MOUTH EVERY DAY 90 tablet 1   omeprazole (PRILOSEC) 40 MG capsule TAKE 1 CAPSULE BY MOUTH DAILY AS NEEDED. 90 capsule 0   traZODone (DESYREL) 50 MG tablet Take 1 tablet (50 mg total) by mouth at bedtime as needed for sleep. 90 tablet 1   Vitamin D, Ergocalciferol, (DRISDOL) 1.25 MG (50000 UNIT) CAPS capsule Take 1 capsule (50,000 Units total) by mouth every 7 (seven) days. 4 capsule 2   No current  facility-administered medications on file prior to visit.    Allergies  Allergen Reactions   No Known Allergies     Social History   Socioeconomic History   Marital status: Married    Spouse name: Not on file   Number of children: Not on file   Years of education: Not on file   Highest education level: Not on file  Occupational History   Not on file  Tobacco Use   Smoking status: Former   Smokeless tobacco: Never  Vaping Use   Vaping Use: Never used  Substance and Sexual Activity   Alcohol use: No   Drug use: No   Sexual activity: Yes    Birth control/protection: None  Other Topics Concern   Not on file  Social History Narrative   Not on file   Social Determinants of Health   Financial Resource Strain: Not on file  Food Insecurity: Not on file  Transportation Needs: Not on file  Physical Activity: Not on file  Stress: Not on file  Social Connections: Not on file  Intimate Partner Violence: Not on file    Family History  Problem Relation Age of Onset   Lupus Mother    Kidney disease Mother    Lupus Cousin     Past Surgical History:  Procedure Laterality Date   ABDOMINAL HYSTERECTOMY     CESAREAN SECTION     CHOLECYSTECTOMY  CYSTOSCOPY  07/04/2019   Procedure: CYSTOSCOPY;  Surgeon: Sanjuana Kava, MD;  Location: [facility];  Service: Gynecology;;   HYSTERECTOMY ABDOMINAL WITH SALPINGECTOMY Bilateral 07/04/2019   Procedure: ABDOMINAL  suprcervical hysterctomyWITH SALPINGECTOMY ;  Surgeon: Sanjuana Kava, MD;  Location: [facility];  Service: Gynecology;  Laterality: Bilateral;   TOTAL LAPAROSCOPIC HYSTERECTOMY WITH SALPINGECTOMY Bilateral 07/04/2019   Procedure: attemted TOTAL LAPAROSCOPIC HYSTERECTOMY WITH SALPINGECTOMY;  Surgeon: Sanjuana Kava, MD;  Location: [facility];  Service: Gynecology;  Laterality: Bilateral;  3 hours    ROS: Review of Systems Negative except as stated above  PHYSICAL EXAM: LMP  06/24/2019   Physical Exam  {female adult master:310786} {female adult master:310785}     Latest Ref Rng & Units 04/21/2021    7:57 AM 10/16/2020   10:58 AM 04/15/2020   10:10 AM  CMP  Glucose 70 - 99 mg/dL 109  96  94   BUN 6 - 20 mg/dL '8  9  7   '$ Creatinine 0.44 - 1.00 mg/dL 0.81  0.91  0.79   Sodium 135 - 145 mmol/L 138  140  138   Potassium 3.5 - 5.1 mmol/L 4.1  4.3  4.1   Chloride 98 - 111 mmol/L 106  104  105   CO2 22 - 32 mmol/L '25  27  24   '$ Calcium 8.9 - 10.3 mg/dL 9.0  9.4  8.8   Total Protein 6.5 - 8.1 g/dL 7.7  7.8  7.4   Total Bilirubin 0.3 - 1.2 mg/dL 0.6  0.9  0.7   Alkaline Phos 38 - 126 U/L 84  100  78   AST 15 - 41 U/L '14  20  18   '$ ALT 0 - 44 U/L '16  27  18    '$ Lipid Panel     Component Value Date/Time   CHOL 189 05/15/2020 1104   TRIG 73 05/15/2020 1104   HDL 60 05/15/2020 1104   CHOLHDL 3.2 05/15/2020 1104   LDLCALC 116 (H) 05/15/2020 1104    CBC    Component Value Date/Time   WBC 9.2 04/21/2021 0757   WBC 10.0 11/30/2019 1631   RBC 4.64 04/21/2021 0757   HGB 12.9 04/21/2021 0757   HGB 13.0 05/27/2019 0850   HCT 37.8 04/21/2021 0757   HCT 39.9 05/27/2019 0850   PLT 271 04/21/2021 0757   PLT 343 05/27/2019 0850   MCV 81.5 04/21/2021 0757   MCV 80 05/27/2019 0850   MCH 27.8 04/21/2021 0757   MCHC 34.1 04/21/2021 0757   RDW 13.7 04/21/2021 0757   RDW 18.2 (H) 05/27/2019 0850   LYMPHSABS 3.9 04/21/2021 0757   LYMPHSABS 2.8 05/27/2019 0850   MONOABS 0.7 04/21/2021 0757   EOSABS 0.2 04/21/2021 0757   EOSABS 0.2 05/27/2019 0850   BASOSABS 0.1 04/21/2021 0757   BASOSABS 0.0 05/27/2019 0850    ASSESSMENT AND PLAN:  There are no diagnoses linked to this encounter.   Patient was given the opportunity to ask questions.  Patient verbalized understanding of the plan and was able to repeat key elements of the plan. Patient was given clear instructions to go to Emergency Department or return to medical center if symptoms don't improve, worsen, or new  problems develop.The patient verbalized understanding.   No orders of the defined types were placed in this encounter.    Requested Prescriptions    No prescriptions requested or ordered in this encounter    No follow-ups on file.  Camillia Herter, NP

## 2021-11-17 ENCOUNTER — Other Ambulatory Visit: Payer: Self-pay | Admitting: Family

## 2021-11-17 ENCOUNTER — Ambulatory Visit: Payer: No Typology Code available for payment source | Admitting: Family

## 2021-11-17 ENCOUNTER — Encounter: Payer: Self-pay | Admitting: Family

## 2021-11-17 VITALS — BP 126/88 | HR 103 | Temp 98.3°F | Resp 16 | Ht 64.02 in | Wt 267.0 lb

## 2021-11-17 DIAGNOSIS — Z7689 Persons encountering health services in other specified circumstances: Secondary | ICD-10-CM

## 2021-11-17 DIAGNOSIS — F40243 Fear of flying: Secondary | ICD-10-CM

## 2021-11-17 DIAGNOSIS — E038 Other specified hypothyroidism: Secondary | ICD-10-CM | POA: Diagnosis not present

## 2021-11-17 DIAGNOSIS — R635 Abnormal weight gain: Secondary | ICD-10-CM

## 2021-11-17 DIAGNOSIS — Z1321 Encounter for screening for nutritional disorder: Secondary | ICD-10-CM

## 2021-11-17 DIAGNOSIS — E559 Vitamin D deficiency, unspecified: Secondary | ICD-10-CM

## 2021-11-17 DIAGNOSIS — K219 Gastro-esophageal reflux disease without esophagitis: Secondary | ICD-10-CM

## 2021-11-17 DIAGNOSIS — Z13228 Encounter for screening for other metabolic disorders: Secondary | ICD-10-CM

## 2021-11-17 DIAGNOSIS — Z8269 Family history of other diseases of the musculoskeletal system and connective tissue: Secondary | ICD-10-CM

## 2021-11-17 DIAGNOSIS — E063 Autoimmune thyroiditis: Secondary | ICD-10-CM

## 2021-11-17 DIAGNOSIS — Z6841 Body Mass Index (BMI) 40.0 and over, adult: Secondary | ICD-10-CM

## 2021-11-17 MED ORDER — LEVOTHYROXINE SODIUM 137 MCG PO TABS: 137.0000 ug | ORAL_TABLET | Freq: Every day | ORAL | 2 refills | Status: DC

## 2021-11-17 MED ORDER — OMEPRAZOLE 40 MG PO CPDR: 40.0000 mg | DELAYED_RELEASE_CAPSULE | Freq: Every day | ORAL | 0 refills | Status: DC | PRN

## 2021-11-17 MED ORDER — PHENTERMINE HCL 15 MG PO CAPS: 15.0000 mg | ORAL_CAPSULE | ORAL | 0 refills | Status: DC

## 2021-11-17 MED ORDER — DIAZEPAM 5 MG PO TABS: 5.0000 mg | ORAL_TABLET | Freq: Two times a day (BID) | ORAL | 0 refills | Status: DC | PRN

## 2021-11-17 NOTE — Telephone Encounter (Signed)
Considering Phentermine is a controlled substance. Please call pharmacy initial Phentermine prescription was sent ([facility] [address]) to ensure cancelled prior to my sending to patient's new pharmacy request. Notify me once complete.

## 2021-11-17 NOTE — Progress Notes (Signed)
Pt presents for hypothyroidism follow-up -weight loss options -needs flight anxiety medication refill has to fly to [place] on Tuesday for work -requested refill on Vit D tablets advised may need to recheck Vit D levels before refill

## 2021-11-17 NOTE — Progress Notes (Signed)
Patient ID: England Greb, female    DOB: 08-06-1975  MRN: [id number]  CC: Follow-Up  Subjective: Lauree Yurick is a 46 y.o. female who presents for follow-up.   Her concerns today include:  - Doing well on thyroid medication without issues or concerns.  - Doing well on acid reflux medication without issues or concerns.  - Would like vitamin D and vitamin b12 checked.  - Would like to begin weight loss medication. Tried Phentermine in the past which helped initially. Does not want to try injectable weight loss medications because heard it can cause cancer. She is trying to monitor what she eats. She is not currently exercising.  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 However, her job does require her to fly for business trips. She is scheduled to fly to [place], [place] on 11/23/2021 and return the same day. There will be a layover during flight time.  - Would like lupus screening rechecked. - Recently seen at hospital in [place], [place]. At that time having difficulty swallowing. Reports she was found to have an enlarged lymph node of the right neck. States this was discovered by ultrasound. Reports her labs returned normal and was told lymph node would eventually go away in 6 weeks and no treatment recommended. Today reports she is feeling back to normal without any residual symptoms. She is aware to follow-up with me as needed.   Patient Active Problem List   Diagnosis Date Noted   COVID-19 11/27/2020   Class 3 severe obesity due to excess calories without serious comorbidity with body mass index (BMI) of 40.0 to 44.9 in adult [facility]) 11/27/2020   Insomnia 04/14/2020   Fibroid uterus 07/04/2019   Uterine fibroid 07/04/2019   Tachycardia 05/14/2019   History of COVID-19 05/13/2019   Hair loss 05/13/2019   OSA (obstructive sleep apnea) 02/09/2016   Family history of systemic lupus erythematosus 02/09/2016   Vitamin D deficiency 09/02/2015   Morbid obesity due to excess  calories ([place]) 09/02/2015   Iron deficiency anemia due to chronic blood loss 09/02/2015   Hypothyroidism due to Hashimoto's thyroiditis 09/02/2015     Current Outpatient Medications on File Prior to Visit  Medication Sig Dispense Refill   clindamycin (CLEOCIN T) 1 % lotion Apply 1 application topically daily.      gabapentin (NEURONTIN) 300 MG capsule Take 300 mg by mouth daily.     Vitamin D, Ergocalciferol, (DRISDOL) 1.25 MG (50000 UNIT) CAPS capsule Take 1 capsule (50,000 Units total) by mouth every 7 (seven) days. 4 capsule 2   No current facility-administered medications on file prior to visit.    Allergies  Allergen Reactions   No Known Allergies     Social History   Socioeconomic History   Marital status: Married    Spouse name: Not on file   Number of children: Not on file   Years of education: Not on file   Highest education level: Not on file  Occupational History   Not on file  Tobacco Use   Smoking status: Former   Smokeless tobacco: Never  Vaping Use   Vaping Use: Never used  Substance and Sexual Activity   Alcohol use: No   Drug use: No   Sexual activity: Yes    Birth control/protection: None  Other Topics Concern   Not on file  Social History Narrative   Not on file   Social Determinants of Health   Financial Resource Strain: Not on file  Food Insecurity:  Not on file  Transportation Needs: Not on file  Physical Activity: Not on file  Stress: Not on file  Social Connections: Not on file  Intimate Partner Violence: Not on file    Family History  Problem Relation Age of Onset   Lupus Mother    Kidney disease Mother    Lupus Cousin     Past Surgical History:  Procedure Laterality Date   ABDOMINAL HYSTERECTOMY     CESAREAN SECTION     CHOLECYSTECTOMY     CYSTOSCOPY  07/04/2019   Procedure: CYSTOSCOPY;  Surgeon: Sanjuana Kava, MD;  Location: [facility];  Service: Gynecology;;   HYSTERECTOMY ABDOMINAL WITH SALPINGECTOMY  Bilateral 07/04/2019   Procedure: ABDOMINAL  suprcervical hysterctomyWITH SALPINGECTOMY ;  Surgeon: Sanjuana Kava, MD;  Location: [place];  Service: Gynecology;  Laterality: Bilateral;   TOTAL LAPAROSCOPIC HYSTERECTOMY WITH SALPINGECTOMY Bilateral 07/04/2019   Procedure: attemted TOTAL LAPAROSCOPIC HYSTERECTOMY WITH SALPINGECTOMY;  Surgeon: Sanjuana Kava, MD;  Location: [facility];  Service: Gynecology;  Laterality: Bilateral;  3 hours    ROS: Review of Systems Negative except as stated above  PHYSICAL EXAM: BP 126/88 (BP Location: Left Arm, Patient Position: Sitting, Cuff Size: Large)   Pulse (!) 103   Temp 98.3 F (36.8 C)   Resp 16   Ht 5' 4.02" (1.626 m)   Wt 267 lb (121.1 kg)   LMP 06/24/2019   SpO2 97%   BMI 45.81 kg/m   Physical Exam HENT:     Head: Normocephalic and atraumatic.  Eyes:     Extraocular Movements: Extraocular movements intact.     Conjunctiva/sclera: Conjunctivae normal.     Pupils: Pupils are equal, round, and reactive to light.  Cardiovascular:     Rate and Rhythm: Tachycardia present.     Pulses: Normal pulses.     Heart sounds: Normal heart sounds.  Pulmonary:     Effort: Pulmonary effort is normal.     Breath sounds: Normal breath sounds.  Musculoskeletal:     Cervical back: Normal range of motion and neck supple.  Neurological:     General: No focal deficit present.     Mental Status: She is alert and oriented to person, place, and time.  Psychiatric:        Mood and Affect: Mood normal.        Behavior: Behavior normal.     ASSESSMENT AND PLAN: Note: I did check the [facility] prescription drug database. Patient last prescribed Hydrocodone-Acetaminophen by Vickki Hearing, MD on 11/12/2021. Today patient reports she doesn't take Hydrocodone-Acetaminophen often only takes as needed. I had a detailed discussion with patient that she should not take Hydrocodone-Acetaminophen with the Phentermine and/or Diazepam  prescribed on today. Discussed spacing medications at least 6 to 8 hours apart to prevent adverse events. Patient verbalized understanding.    1. Hypothyroidism due to Hashimoto's thyroiditis - Continue Levothyroxine as prescribed.  - Follow-up with primary provider in 3 months or sooner if needed.  - levothyroxine (SYNTHROID) 137 MCG tablet; Take 1 tablet (137 mcg total) by mouth daily.  Dispense: 30 tablet; Refill: 2  2. Gastroesophageal reflux disease, unspecified whether esophagitis present - Continue Omeprazole as prescribed.  - Follow-up with primary provider as scheduled.  - omeprazole (PRILOSEC) 40 MG capsule; Take 1 capsule (40 mg total) by mouth daily as needed.  Dispense: 90 capsule; Refill: 0  3. Encounter for weight management 4. Class 3 severe obesity with body mass index (BMI) of  45.0 to 49.9 in adult, unspecified obesity type, unspecified whether serious comorbidity present ([place]) 5. Weight gain - Begin Phentermine as prescribed. Counseled on medication adherence and adverse effects.  - Referral to Medical Weight Management for further evaluation and management. Follow-up with me for weight checks every 4 weeks until established with Medical Weight Management.  - phentermine 15 MG capsule; Take 1 capsule (15 mg total) by mouth every morning.  Dispense: 30 capsule; Refill: 0 - Amb Ref to Medical Weight Management  6. Vitamin D deficiency - Update lab.  - Vitamin D, 25-hydroxy  7. Encounter for vitamin deficiency screening - Routine screening.  - Vitamin B12  8. Family history of systemic lupus erythematosus - Update lab.  - ANA  9. Screening for metabolic disorder - Update lab.  - Basic Metabolic Panel  10. Fear of flying - Diazepam as prescribed. This will be available for patient pickup from pharmacy on 11/22/2021. She was counseled on medication adherence and adverse effects.  - diazepam (VALIUM) 5 MG tablet; Take 1 tablet (5 mg total) by mouth every 12 (twelve)  hours as needed for up to 2 doses for anxiety.  Dispense: 2 tablet; Refill: 0     Patient was given the opportunity to ask questions.  Patient verbalized understanding of the plan and was able to repeat key elements of the plan. Patient was given clear instructions to go to Emergency Department or return to medical center if symptoms don't improve, worsen, or new problems develop.The patient verbalized understanding.   Orders Placed This Encounter  Procedures   Vitamin D, 25-hydroxy   Vitamin U98   Basic Metabolic Panel   ANA   Amb Ref to Medical Weight Management     Requested Prescriptions   Signed Prescriptions Disp Refills   phentermine 15 MG capsule 30 capsule 0    Sig: Take 1 capsule (15 mg total) by mouth every morning.   levothyroxine (SYNTHROID) 137 MCG tablet 30 tablet 2    Sig: Take 1 tablet (137 mcg total) by mouth daily.   omeprazole (PRILOSEC) 40 MG capsule 90 capsule 0    Sig: Take 1 capsule (40 mg total) by mouth daily as needed.   diazepam (VALIUM) 5 MG tablet 2 tablet 0    Sig: Take 1 tablet (5 mg total) by mouth every 12 (twelve) hours as needed for up to 2 doses for anxiety.    Return in about 3 months (around 02/16/2022) for Follow-Up or next available chronic care mgmt .  Camillia Herter, NP

## 2021-11-17 NOTE — Telephone Encounter (Signed)
I did not receive a message from pharmacy. Considering Phentermine is a controlled substance. Please call pharmacy initial Phentermine prescription was sent ([facility] [address]) to ensure cancelled prior to my sending to patient's new pharmacy request. Notify me once complete.

## 2021-11-17 NOTE — Telephone Encounter (Signed)
PT Asking to please transfer Phentermine medication specifically to   [facility] [place], [place] [address] today if possible before PCP leaves office please, per patient.

## 2021-11-17 NOTE — Telephone Encounter (Signed)
Medication Refill - Medication: phentermine 15 MG capsule  Has the patient contacted their pharmacy? No.  Preferred Pharmacy (with phone number or street name):  [facility] [place], [place] [address]   [phone]  Has the patient been seen for an appointment in the last year OR does the patient have an upcoming appointment? Yes.    Current pharmacy does not have medication in stock

## 2021-11-17 NOTE — Telephone Encounter (Signed)
Requested medication (s) are due for refill today: no  Requested medication (s) are on the active medication list: yes  Last refill:  today #30/0  Future visit scheduled: no  Notes to clinic:  [facility] Not delegated.      Requested Prescriptions  Pending Prescriptions Disp Refills   phentermine 15 MG capsule 30 capsule 0    Sig: Take 1 capsule (15 mg total) by mouth every morning.     Not Delegated - Neurology: Anticonvulsants - Controlled - phentermine hydrochloride Failed - 11/17/2021  4:26 PM      Failed - This refill cannot be delegated      Passed - eGFR in normal range and within 360 days    GFR calc Af Amer  Date Value Ref Range Status  12/26/2019 117 >59 mL/min/1.73 Final    Comment:    **In accordance with recommendations from the NKF-ASN Task force,**   [facility] is in the process of updating its eGFR calculation to the   2021 CKD-EPI creatinine equation that estimates kidney function   without a race variable.    GFR, Estimated  Date Value Ref Range Status  04/21/2021 >60 >60 mL/min Final    Comment:    (NOTE) Calculated using the CKD-EPI Creatinine Equation (2021)          Passed - Cr in normal range and within 360 days    Creatinine  Date Value Ref Range Status  04/21/2021 0.81 0.44 - 1.00 mg/dL Final         Passed - Last BP in normal range    BP Readings from Last 1 Encounters:  11/17/21 126/88         Passed - Valid encounter within last 6 months    Recent Outpatient Visits           Today Hypothyroidism due to Hashimoto's thyroiditis   Primary [facility] at [facility], Amy J, NP   8 months ago Hypothyroidism due to Hashimoto's thyroiditis   Primary Care at [facility], Flonnie Hailstone, NP   11 months ago COVID-19   Primary Care at [facility], Kriste Basque, NP   1 year ago Hypothyroidism due to Hashimoto's thyroiditis   Primary [facility] at [facility], [place], NP   1 year ago Otalgia of  left ear   Primary Care at [facility], Bayard Beaver, MD       Future Appointments             In 3 weeks Camillia Herter, NP Primary Care at [facility] - Weight completed in the last 3 months    Wt Readings from Last 1 Encounters:  11/17/21 267 lb (121.1 kg)

## 2021-11-18 LAB — BASIC METABOLIC PANEL
BUN/Creatinine Ratio: 10 (ref 9–23)
BUN: 8 mg/dL (ref 6–24)
CO2: 23 mmol/L (ref 20–29)
Calcium: 10.1 mg/dL (ref 8.7–10.2)
Chloride: 100 mmol/L (ref 96–106)
Creatinine, Ser: 0.77 mg/dL (ref 0.57–1.00)
Glucose: 114 mg/dL — ABNORMAL HIGH (ref 70–99)
Potassium: 4.2 mmol/L (ref 3.5–5.2)
Sodium: 139 mmol/L (ref 134–144)
eGFR: 97 mL/min/{1.73_m2} (ref >59)

## 2021-11-18 LAB — VITAMIN B12: Vitamin B-12: 724 pg/mL (ref 232–1245)

## 2021-11-18 LAB — ANA: Anti Nuclear Antibody (ANA): NEGATIVE

## 2021-11-18 LAB — VITAMIN D 25 HYDROXY (VIT D DEFICIENCY, FRACTURES): Vit D, 25-Hydroxy: 29.1 ng/mL — ABNORMAL LOW (ref 30.0–100.0)

## 2021-11-22 ENCOUNTER — Other Ambulatory Visit: Payer: Self-pay | Admitting: Family

## 2021-11-22 DIAGNOSIS — R635 Abnormal weight gain: Secondary | ICD-10-CM

## 2021-11-22 DIAGNOSIS — Z7689 Persons encountering health services in other specified circumstances: Secondary | ICD-10-CM

## 2021-11-22 MED ORDER — PHENTERMINE HCL 15 MG PO CAPS: 15.0000 mg | ORAL_CAPSULE | ORAL | 0 refills | Status: DC

## 2021-11-22 NOTE — Telephone Encounter (Signed)
Pt called to see if the RX for Phentermine was resent to [place] since [facility]'t have this RX in stock and it is on backorder / please advise when resent today

## 2021-11-22 NOTE — Telephone Encounter (Signed)
Thank you. Order sent to patient's requested pharmacy at [place].

## 2021-11-30 ENCOUNTER — Ambulatory Visit (INDEPENDENT_AMBULATORY_CARE_PROVIDER_SITE_OTHER): Payer: No Typology Code available for payment source

## 2021-11-30 DIAGNOSIS — Z23 Encounter for immunization: Secondary | ICD-10-CM | POA: Diagnosis not present

## 2021-11-30 NOTE — Progress Notes (Signed)
Influenza vaccine administered right deltoid

## 2021-12-08 NOTE — Progress Notes (Signed)
Erroneous encounter-disregard

## 2021-12-14 ENCOUNTER — Encounter: Payer: No Typology Code available for payment source | Admitting: Family

## 2021-12-14 DIAGNOSIS — Z7689 Persons encountering health services in other specified circumstances: Secondary | ICD-10-CM

## 2021-12-16 NOTE — Progress Notes (Signed)
Patient ID: Heather Campos, female    DOB: 1975-08-25  MRN: [id number]  CC: Weight Check  Subjective: Heather Campos is a 46 y.o. female who presents for weight check.   Her concerns today include:  - Doing well on Phentermine. Does cause some jitteriness but tolerable.  - Reports having anxiety attacks and depression. This is not new onset. Reports she has been feeling this way for months but usually keeping her feelings inside. Reports the causes vary. Endorses concerns about things going on in the world. Recalls recently having an anxiety attack around 2 am and EMS was called to her home. States everything turned out to be ok when they evaluated her and she did not go to the emergency department for evaluation. Considered if her weight is causing her to feel this way. States "I feel crazy sometimes" Her husband is supportive. She does pray to help. Reports she does have a concealed carry permit for protection purposes only. She denies thoughts of self-harm, suicidal ideations, and homicidal ideations.    Patient Active Problem List   Diagnosis Date Noted   Cerumen debris on tympanic membrane of left ear 09/30/2021   Hypothyroidism 08/20/2021   Chronic pain 08/17/2021   Lumbar spondylosis 06/29/2021   Bilateral carpal tunnel syndrome 06/23/2021   Pain in joint of left knee 02/04/2021   COVID-19 11/27/2020   Class 3 severe obesity due to excess calories without serious comorbidity with body mass index (BMI) of 40.0 to 44.9 in adult ([place]) 11/27/2020   Pain in joint of right shoulder 10/06/2020   Calcific tendinitis of right shoulder 10/06/2020   Low back pain 08/13/2020   Insomnia 04/14/2020   Pain of toe of right foot 02/24/2020   Stiffness of left shoulder joint 11/14/2019   Adhesive capsulitis of left shoulder 10/02/2019   Shoulder pain, left 09/03/2019   Calcific tendinitis of left shoulder 09/03/2019   Fibroid uterus 07/04/2019   Uterine fibroid 07/04/2019   Tachycardia  05/14/2019   History of COVID-19 05/13/2019   Hair loss 05/13/2019   OSA (obstructive sleep apnea) 02/09/2016   Family history of systemic lupus erythematosus 02/09/2016   Vitamin D deficiency 09/02/2015   Morbid obesity due to excess calories ([place]) 09/02/2015   Iron deficiency anemia due to chronic blood loss 09/02/2015   Hypothyroidism due to Hashimoto's thyroiditis 09/02/2015     Current Outpatient Medications on File Prior to Visit  Medication Sig Dispense Refill   Azelaic Acid 15 % gel 1 application Externally Twice a day for 30 days     doxycycline (VIBRA-TABS) 100 MG tablet Take 100 mg by mouth daily.     meloxicam (MOBIC) 15 MG tablet Take 15 mg by mouth daily.     metFORMIN (GLUCOPHAGE) 500 MG tablet Take 500 mg by mouth 3 (three) times daily.     oxyCODONE-acetaminophen (PERCOCET/ROXICET) 5-325 MG tablet Take 1 tablet by mouth every 8 (eight) hours as needed.     clindamycin (CLEOCIN T) 1 % lotion Apply 1 application topically daily.      diazepam (VALIUM) 5 MG tablet Take 1 tablet (5 mg total) by mouth every 12 (twelve) hours as needed for up to 2 doses for anxiety. 2 tablet 0   gabapentin (NEURONTIN) 300 MG capsule Take 300 mg by mouth daily.     levothyroxine (SYNTHROID) 137 MCG tablet Take 1 tablet (137 mcg total) by mouth daily. 30 tablet 2   omeprazole (PRILOSEC) 40 MG capsule Take 1 capsule (40 mg total)  by mouth daily as needed. 90 capsule 0   Vitamin D, Ergocalciferol, (DRISDOL) 1.25 MG (50000 UNIT) CAPS capsule Take 1 capsule (50,000 Units total) by mouth every 7 (seven) days. 4 capsule 2   No current facility-administered medications on file prior to visit.    Allergies  Allergen Reactions   No Known Allergies     Social History   Socioeconomic History   Marital status: Married    Spouse name: Not on file   Number of children: Not on file   Years of education: Not on file   Highest education level: Not on file  Occupational History   Not on file   Tobacco Use   Smoking status: Former    Passive exposure: Past   Smokeless tobacco: Never  Vaping Use   Vaping Use: Never used  Substance and Sexual Activity   Alcohol use: No   Drug use: No   Sexual activity: Yes    Birth control/protection: None  Other Topics Concern   Not on file  Social History Narrative   Not on file   Social Determinants of Health   Financial Resource Strain: Not on file  Food Insecurity: Not on file  Transportation Needs: Not on file  Physical Activity: Not on file  Stress: Not on file  Social Connections: Not on file  Intimate Partner Violence: Not on file    Family History  Problem Relation Age of Onset   Lupus Mother    Kidney disease Mother    Lupus Cousin     Past Surgical History:  Procedure Laterality Date   ABDOMINAL HYSTERECTOMY     CESAREAN SECTION     CHOLECYSTECTOMY     CYSTOSCOPY  07/04/2019   Procedure: CYSTOSCOPY;  Surgeon: Sanjuana Kava, MD;  Location: [place];  Service: Gynecology;;   HYSTERECTOMY ABDOMINAL WITH SALPINGECTOMY Bilateral 07/04/2019   Procedure: ABDOMINAL  suprcervical hysterctomyWITH SALPINGECTOMY ;  Surgeon: Sanjuana Kava, MD;  Location: [place];  Service: Gynecology;  Laterality: Bilateral;   TOTAL LAPAROSCOPIC HYSTERECTOMY WITH SALPINGECTOMY Bilateral 07/04/2019   Procedure: attemted TOTAL LAPAROSCOPIC HYSTERECTOMY WITH SALPINGECTOMY;  Surgeon: Sanjuana Kava, MD;  Location: [place];  Service: Gynecology;  Laterality: Bilateral;  3 hours    ROS: Review of Systems Negative except as stated above  PHYSICAL EXAM: BP 117/83 (BP Location: Left Arm, Patient Position: Sitting, Cuff Size: Large)   Pulse 86   Temp 98.3 F (36.8 C)   Resp 16   Ht 5' 4.02" (1.626 m)   Wt 257 lb (116.6 kg)   LMP 06/24/2019   SpO2 98%   BMI 44.09 kg/m   Wt Readings from Last 3 Encounters:  12/24/21 257 lb (116.6 kg)  11/17/21 267 lb (121.1 kg)  09/15/21 250 lb (113.4 kg)    Physical Exam HENT:     Head: Normocephalic and atraumatic.  Eyes:     Extraocular Movements: Extraocular movements intact.     Conjunctiva/sclera: Conjunctivae normal.     Pupils: Pupils are equal, round, and reactive to light.  Cardiovascular:     Rate and Rhythm: Normal rate and regular rhythm.     Pulses: Normal pulses.     Heart sounds: Normal heart sounds.  Pulmonary:     Effort: Pulmonary effort is normal.     Breath sounds: Normal breath sounds.  Musculoskeletal:     Cervical back: Normal range of motion and neck supple.  Neurological:     General: No focal deficit  present.     Mental Status: She is alert and oriented to person, place, and time.  Psychiatric:        Mood and Affect: Mood normal. Affect is tearful.     ASSESSMENT AND PLAN: 1. Encounter for weight management 2. Weight gain - Patient doing well with weight loss. She has lost 10 pounds since last visit.  - Increase Phentermine from 15 mg daily to 30 mg daily. - Follow-up with primary provider in 4 weeks or sooner if needed.  - phentermine 30 MG capsule; Take 1 capsule (30 mg total) by mouth every morning.  Dispense: 30 capsule; Refill: 0  3. Anxiety and depression - Patient denies thoughts of self-harm, suicidal ideations, homicidal ideations. - Begin Sertraline and Hydroxyzine as prescribed. Counseled on medication adherence and adverse effects.  - Referral to Rosana Hoes, LCSWA for counseling and community resources.  - Referral to Psychiatry for further evaluation and management. During the interim patient will follow-up with me as needed until established with Psychiatry.  - hydrOXYzine (VISTARIL) 25 MG capsule; Take 1 capsule (25 mg total) by mouth every 8 (eight) hours as needed.  Dispense: 30 capsule; Refill: 0 - sertraline (ZOLOFT) 25 MG tablet; Take 1 tablet (25 mg total) by mouth daily.  Dispense: 30 tablet; Refill: 0 - Ambulatory referral to Psychiatry  Patient was given the opportunity  to ask questions.  Patient verbalized understanding of the plan and was able to repeat key elements of the plan. Patient was given clear instructions to go to Emergency Department or return to medical center if symptoms don't improve, worsen, or new problems develop.The patient verbalized understanding.   Orders Placed This Encounter  Procedures   Ambulatory referral to Psychiatry     Requested Prescriptions   Signed Prescriptions Disp Refills   phentermine 30 MG capsule 30 capsule 0    Sig: Take 1 capsule (30 mg total) by mouth every morning.   hydrOXYzine (VISTARIL) 25 MG capsule 30 capsule 0    Sig: Take 1 capsule (25 mg total) by mouth every 8 (eight) hours as needed.   sertraline (ZOLOFT) 25 MG tablet 30 tablet 0    Sig: Take 1 tablet (25 mg total) by mouth daily.    Return in about 4 weeks (around 01/21/2022) for Follow-Up or next available weight check.  Camillia Herter, NP

## 2021-12-21 ENCOUNTER — Encounter (INDEPENDENT_AMBULATORY_CARE_PROVIDER_SITE_OTHER): Payer: Self-pay

## 2021-12-24 ENCOUNTER — Telehealth: Payer: Self-pay | Admitting: Family

## 2021-12-24 ENCOUNTER — Ambulatory Visit: Payer: No Typology Code available for payment source | Admitting: Family

## 2021-12-24 ENCOUNTER — Other Ambulatory Visit: Payer: Self-pay

## 2021-12-24 ENCOUNTER — Encounter: Payer: Self-pay | Admitting: Family

## 2021-12-24 VITALS — BP 117/83 | HR 86 | Temp 98.3°F | Resp 16 | Ht 64.02 in | Wt 257.0 lb

## 2021-12-24 DIAGNOSIS — E6609 Other obesity due to excess calories: Secondary | ICD-10-CM | POA: Diagnosis not present

## 2021-12-24 DIAGNOSIS — F419 Anxiety disorder, unspecified: Secondary | ICD-10-CM | POA: Diagnosis not present

## 2021-12-24 DIAGNOSIS — F32A Depression, unspecified: Secondary | ICD-10-CM

## 2021-12-24 DIAGNOSIS — R635 Abnormal weight gain: Secondary | ICD-10-CM

## 2021-12-24 DIAGNOSIS — Z6841 Body Mass Index (BMI) 40.0 and over, adult: Secondary | ICD-10-CM

## 2021-12-24 DIAGNOSIS — Z7689 Persons encountering health services in other specified circumstances: Secondary | ICD-10-CM

## 2021-12-24 MED ORDER — SERTRALINE HCL 25 MG PO TABS: 25.0000 mg | ORAL_TABLET | Freq: Every day | ORAL | 0 refills | Status: DC

## 2021-12-24 MED ORDER — PHENTERMINE HCL 30 MG PO CAPS: 30.0000 mg | ORAL_CAPSULE | ORAL | 0 refills | Status: DC

## 2021-12-24 MED ORDER — HYDROXYZINE PAMOATE 25 MG PO CAPS: 25.0000 mg | ORAL_CAPSULE | Freq: Three times a day (TID) | ORAL | 0 refills | Status: DC | PRN

## 2021-12-24 NOTE — Patient Instructions (Signed)
Phentermine Capsules or Tablets What is this medication? PHENTERMINE (FEN ter meen) promotes weight loss. It works by decreasing appetite. It is often used for a short period of time. Changes to diet and exercise are often combined with this medication. This medicine may be used for other purposes; ask your health care provider or pharmacist if you have questions. COMMON BRAND NAME(S): Adipex-P, Atti-Plex P, Atti-Plex P Spansule, Fastin, Lomaira, Pro-Fast, Pro-Fast HS, Pro-Fast SA, Tara-8 What should I tell my care team before I take this medication? They need to know if you have any of these conditions: Agitation or nervousness Diabetes Glaucoma Heart disease High blood pressure History of substance use disorder History of stroke Kidney disease Lung disease called Primary Pulmonary Hypertension (PPH) Taken an MAOI, such as Carbex, Eldepryl, Marplan, Nardil, or Parnate in last 14 days Taking stimulant medications for attention disorders, weight loss, or to stay awake Thyroid disease An unusual or allergic reaction to phentermine, other medications, foods, dyes, or preservatives Pregnant or trying to get pregnant Breastfeeding How should I use this medication? Take this medication by mouth with a glass of water. Follow the directions on the prescription label. Take your medication at regular intervals. Do not take it more often than directed. Do not stop taking except on your care team's advice. Talk to your care team about the use of this medication in children. While this medication may be prescribed for children 17 years or older for selected conditions, precautions do apply. Overdosage: If you think you have taken too much of this medicine contact a poison control center or emergency room at once. NOTE: This medicine is only for you. Do not share this medicine with others. What if I miss a dose? If you miss a dose, take it as soon as you can. If it is almost time for your next dose,  take only that dose. Do not take double or extra doses. What may interact with this medication? Do not take this medication with any of the following: MAOIs, such as Carbex, Eldepryl, Marplan, Nardil, and Parnate This medication may also interact with the following: Alcohol Certain medications for depression, anxiety, or other mental health conditions Certain medications for blood pressure Linezolid Medications for colds or breathing difficulties, such as pseudoephedrine or phenylephrine Medications for diabetes Sibutramine Stimulant medications for ADHD, weight loss, or staying awake This list may not describe all possible interactions. Give your health care provider a list of all the medicines, herbs, non-prescription drugs, or dietary supplements you use. Also tell them if you smoke, drink alcohol, or use illegal drugs. Some items may interact with your medicine. What should I watch for while using this medication? Visit your care team for regular checks on your progress. Do not stop taking except on your care team's advice. You may develop a severe reaction. Your care team will tell you how much medication to take. Do not take this medication close to bedtime. It may prevent you from sleeping. This medication may affect your coordination, reaction time, or judgment. Do not drive or operate machinery until you know how this medication affects you. Sit up or stand slowly to reduce the risk of dizzy or fainting spells. Drinking alcohol with this medication can increase the risk of these side effects. This medication may affect blood sugar levels. Ask your care team if changes in diet or medications are needed if you have diabetes. Inform your care team if you wish to become pregnant or think you might be pregnant. Losing   weight while pregnant is not advised and may cause harm to the unborn child. Talk to your care team for more information. What side effects may I notice from receiving this  medication? Side effects that you should report to your care team as soon as possible: Allergic reactions--skin rash, itching, hives, swelling of the face, lips, tongue, or throat Heart valve disease--shortness of breath, chest pain, unusual weakness or fatigue, dizziness, feeling faint or lightheaded, fever, sudden weight gain, fast or irregular heartbeat Pulmonary hypertension--shortness of breath, chest pain, fast or irregular heartbeat, feeling faint or lightheaded, fatigue, swelling of the ankles or feet Side effects that usually do not require medical attention (report to your care team if they continue or are bothersome): Change in taste Diarrhea Dizziness Dry mouth Restlessness Trouble sleeping This list may not describe all possible side effects. Call your doctor for medical advice about side effects. You may report side effects to FDA at 1-800-FDA-1088. Where should I keep my medication? Keep out of the reach of children. This medication can be abused. Keep your medication in a safe place to protect it from theft. Do not share this medication with anyone. Selling or giving away this medication is dangerous and against the law. This medication may cause harm and death if it is taken by other adults, children, or pets. Return medication that has not been used to an official disposal site. Contact the DEA at 1-800-882-9539 or your city/county government to find a site. If you cannot return the medication, mix any unused medication with a substance like cat litter or coffee grounds. Then throw the medication away in a sealed container like a sealed bag or coffee can with a lid. Do not use the medication after the expiration date. Store at room temperature between 20 and 25 degrees C (68 and 77 degrees F). Keep container tightly closed. NOTE: This sheet is a summary. It may not cover all possible information. If you have questions about this medicine, talk to your doctor, pharmacist, or health  care provider.  2023 Elsevier/Gold Standard (2020-05-25 00:00:00)  

## 2021-12-24 NOTE — Progress Notes (Signed)
.  Pt presents for weight management follow-up   -lost 10lbs since last visit -pt stated she is experiencing some jitteriness

## 2021-12-29 ENCOUNTER — Encounter: Payer: Self-pay | Admitting: Family

## 2021-12-29 ENCOUNTER — Other Ambulatory Visit: Payer: Self-pay | Admitting: Family

## 2021-12-29 DIAGNOSIS — R635 Abnormal weight gain: Secondary | ICD-10-CM

## 2021-12-29 DIAGNOSIS — Z7689 Persons encountering health services in other specified circumstances: Secondary | ICD-10-CM

## 2021-12-29 MED ORDER — PHENTERMINE HCL 30 MG PO CAPS: 30.0000 mg | ORAL_CAPSULE | ORAL | 0 refills | Status: DC

## 2021-12-29 NOTE — Progress Notes (Signed)
Reordered per pharmacy request.

## 2021-12-30 NOTE — Telephone Encounter (Signed)
Order has been completed and updated 12/29/21

## 2022-01-04 ENCOUNTER — Other Ambulatory Visit (HOSPITAL_COMMUNITY): Payer: Self-pay

## 2022-01-04 MED ORDER — MELOXICAM 15 MG PO TABS
ORAL_TABLET | ORAL | 3 refills | Status: DC
  Filled 2022-01-04: qty 30, 30d supply, fill #0
  Filled 2022-02-28: qty 30, 30d supply, fill #1

## 2022-01-04 MED ORDER — HYDROCODONE-ACETAMINOPHEN 10-325 MG PO TABS
1.0000 | ORAL_TABLET | Freq: Three times a day (TID) | ORAL | 0 refills | Status: DC | PRN
  Filled 2022-01-04: qty 60, 20d supply, fill #0

## 2022-01-04 MED ORDER — GABAPENTIN 300 MG PO CAPS
ORAL_CAPSULE | ORAL | 3 refills | Status: DC
  Filled 2022-01-04: qty 30, 30d supply, fill #0
  Filled 2022-02-28: qty 30, 30d supply, fill #1

## 2022-01-04 NOTE — Telephone Encounter (Signed)
LCSWA called patient today to introduce herself and to assess patients' mental health needs. Patient did not answer the phone. LCSWA was able to leave a brief message with the patient asking them to return the call. Patient was referred by PCP for anxiety and depression.

## 2022-01-19 NOTE — Progress Notes (Signed)
Erroneous encounter-disregard

## 2022-01-25 ENCOUNTER — Encounter: Payer: No Typology Code available for payment source | Admitting: Family

## 2022-01-25 ENCOUNTER — Telehealth: Payer: Self-pay

## 2022-01-25 DIAGNOSIS — F419 Anxiety disorder, unspecified: Secondary | ICD-10-CM

## 2022-01-25 DIAGNOSIS — Z7689 Persons encountering health services in other specified circumstances: Secondary | ICD-10-CM

## 2022-01-25 NOTE — Telephone Encounter (Signed)
Att to contact pt to verify if she ever started dose '30mg'$  of Phentermine, as this will determine how provider would like her to proceed. Lvm for pt to call back

## 2022-01-26 ENCOUNTER — Other Ambulatory Visit (HOSPITAL_COMMUNITY): Payer: Self-pay

## 2022-01-26 MED ORDER — GABAPENTIN 300 MG PO CAPS
300.0000 mg | ORAL_CAPSULE | Freq: Every day | ORAL | 3 refills | Status: DC
  Filled 2022-01-26 – 2022-02-02 (×2): qty 30, 30d supply, fill #0
  Filled 2022-02-28 – 2022-05-05 (×3): qty 30, 30d supply, fill #1

## 2022-01-26 MED ORDER — MELOXICAM 15 MG PO TABS
15.0000 mg | ORAL_TABLET | Freq: Every day | ORAL | 3 refills | Status: DC | PRN
  Filled 2022-01-26 – 2022-02-02 (×2): qty 30, 30d supply, fill #0
  Filled 2022-02-21: qty 30, 30d supply, fill #1

## 2022-01-26 MED ORDER — HYDROCODONE-ACETAMINOPHEN 10-325 MG PO TABS
1.0000 | ORAL_TABLET | Freq: Three times a day (TID) | ORAL | 0 refills | Status: DC | PRN
  Filled 2022-01-26: qty 60, 20d supply, fill #0

## 2022-01-28 ENCOUNTER — Other Ambulatory Visit (HOSPITAL_COMMUNITY): Payer: Self-pay

## 2022-02-02 ENCOUNTER — Other Ambulatory Visit (HOSPITAL_COMMUNITY): Payer: Self-pay

## 2022-02-07 NOTE — Progress Notes (Signed)
Erroneous encounter-disregard

## 2022-02-15 ENCOUNTER — Encounter: Payer: No Typology Code available for payment source | Admitting: Family

## 2022-02-15 DIAGNOSIS — E038 Other specified hypothyroidism: Secondary | ICD-10-CM

## 2022-02-15 DIAGNOSIS — Z7689 Persons encountering health services in other specified circumstances: Secondary | ICD-10-CM

## 2022-02-15 DIAGNOSIS — F419 Anxiety disorder, unspecified: Secondary | ICD-10-CM

## 2022-02-15 NOTE — Progress Notes (Addendum)
Patient ID: Heather Campos, female    DOB: 1975-09-07  MRN: [id number]  CC: Follow-Up  Subjective: Trishelle Devora is a 46 y.o. female who presents for follow-up.   Her concerns today include:  - Since last visit anxiety and depression persisting. She denies thoughts of self-harm, suicidal ideations, and homicidal ideations. Reports herself, her husband, and her son moved to [place], [place] 2 weeks ago. States they were not settled in the new home for long before the homeowner told them they had to move out due to paperwork issues. They moved back to [place], [place] into the same home they were living in before. Also, having family issues. States that she is Muslim and does not celebrate Christmas. Her mother and siblings are Christians and do celebrate Christmas. Her mother invited her to her home for Christmas and she planned to go and visit. However, with the events of moving residences twice within 2 weeks being overwhelming she informed her mother that she would be unable to visit during Christmastime. States her mother became upset and felt that the reason she was using was an excuse not to visit. States she feels her mother doesn't understand when she has things to do that prevent her from attending family events but seems to understand when other family members have things to do that prevent them from attending. States her mother feels her husband forced her to become Muslim which she says isn't true. States she doesn't wear her hijab all of the time because of how the public/society views Muslims in a negative way. She tried taking Sertraline and Hydroxyzine prescribed during last visit. States one of the medications "made her feel different" but she isn't sure which one it is but continued to take it because she felt she needed it. Reports she never heard from the Psychiatry referral. Thinks they had the wrong phone number listed for her. Since then she has updated her phone number in patient  contact information. She is requesting 3 days off of work. 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 Reports her job only allows her to take off 3 days at a time. Then she has to go back to work and then can take off another 3 days. She is looking into applying for FMLA soon. - Phentermine shortage at most pharmacies. Has not taken Phentermine since 2 months ago because of this. At that time she was taking the 15 mg dose and doing well on that dose. States she found out that [facility] does have the medication in stock. - Taking Levothyroxine, no issues/concerns.    Patient Active Problem List   Diagnosis Date Noted   Anxiety and depression 12/24/2021   Cerumen debris on tympanic membrane of left ear 09/30/2021   Hypothyroidism 08/20/2021   Chronic pain 08/17/2021   Lumbar spondylosis 06/29/2021   Bilateral carpal tunnel syndrome 06/23/2021   Pain in joint of left knee 02/04/2021   COVID-19 11/27/2020   Class 3 severe obesity due to excess calories without serious comorbidity with body mass index (BMI) of 40.0 to 44.9 in adult ([place]) 11/27/2020   Pain in joint of right shoulder 10/06/2020   Calcific tendinitis of right shoulder 10/06/2020   Low back pain 08/13/2020   Insomnia 04/14/2020   Pain of toe of right foot 02/24/2020   Stiffness of left shoulder joint 11/14/2019   Adhesive capsulitis of left shoulder 10/02/2019   Shoulder pain, left 09/03/2019   Calcific tendinitis of left shoulder 09/03/2019  Fibroid uterus 07/04/2019   Uterine fibroid 07/04/2019   Tachycardia 05/14/2019   History of COVID-19 05/13/2019   Hair loss 05/13/2019   OSA (obstructive sleep apnea) 02/09/2016   Family history of systemic lupus erythematosus 02/09/2016   Vitamin D deficiency 09/02/2015   Morbid obesity due to excess calories ([place]) 09/02/2015   Iron deficiency anemia due to chronic blood loss 09/02/2015   Hypothyroidism due to Hashimoto's thyroiditis 09/02/2015     Current Outpatient  Medications on File Prior to Visit  Medication Sig Dispense Refill   Azelaic Acid 15 % gel 1 application Externally Twice a day for 30 days     clindamycin (CLEOCIN T) 1 % lotion Apply 1 application topically daily.      diazepam (VALIUM) 5 MG tablet Take 1 tablet (5 mg total) by mouth every 12 (twelve) hours as needed for up to 2 doses for anxiety. 2 tablet 0   doxycycline (VIBRA-TABS) 100 MG tablet Take 100 mg by mouth daily.     gabapentin (NEURONTIN) 300 MG capsule Take 300 mg by mouth daily.     gabapentin (NEURONTIN) 300 MG capsule Take 1 capsule by mouth once daily at bedtime 30 capsule 3   gabapentin (NEURONTIN) 300 MG capsule Take 1 capsule (300 mg total) by mouth at bedtime. 30 capsule 3   HYDROcodone-acetaminophen (NORCO) 10-325 MG tablet Take 1 tablet by mouth every 8 (eight) hours as needed. 60 tablet 0   hydrOXYzine (VISTARIL) 25 MG capsule Take 1 capsule (25 mg total) by mouth every 8 (eight) hours as needed. 30 capsule 0   levothyroxine (SYNTHROID) 137 MCG tablet Take 1 tablet (137 mcg total) by mouth daily. 30 tablet 2   meloxicam (MOBIC) 15 MG tablet Take 15 mg by mouth daily.     meloxicam (MOBIC) 15 MG tablet Take 1 tablet by mouth every day as needed. 30 tablet 3   meloxicam (MOBIC) 15 MG tablet Take 1 tablet (15 mg total) by mouth daily as needed. 30 tablet 3   metFORMIN (GLUCOPHAGE) 500 MG tablet Take 500 mg by mouth 3 (three) times daily.     omeprazole (PRILOSEC) 40 MG capsule Take 1 capsule (40 mg total) by mouth daily as needed. 90 capsule 0   oxyCODONE-acetaminophen (PERCOCET/ROXICET) 5-325 MG tablet Take 1 tablet by mouth every 8 (eight) hours as needed.     Vitamin D, Ergocalciferol, (DRISDOL) 1.25 MG (50000 UNIT) CAPS capsule Take 1 capsule (50,000 Units total) by mouth every 7 (seven) days. 4 capsule 2   No current facility-administered medications on file prior to visit.    Allergies  Allergen Reactions   No Known Allergies     Social History    Socioeconomic History   Marital status: Married    Spouse name: Not on file   Number of children: Not on file   Years of education: Not on file   Highest education level: Not on file  Occupational History   Not on file  Tobacco Use   Smoking status: Former    Passive exposure: Past   Smokeless tobacco: Never  Vaping Use   Vaping Use: Never used  Substance and Sexual Activity   Alcohol use: No   Drug use: No   Sexual activity: Yes    Birth control/protection: None  Other Topics Concern   Not on file  Social History Narrative   Not on file   Social Determinants of Health   Financial Resource Strain: Not on file  Food Insecurity: Not  on file  Transportation Needs: Not on file  Physical Activity: Not on file  Stress: Not on file  Social Connections: Not on file  Intimate Partner Violence: Not on file    Family History  Problem Relation Age of Onset   Lupus Mother    Kidney disease Mother    Lupus Cousin     Past Surgical History:  Procedure Laterality Date   ABDOMINAL HYSTERECTOMY     CESAREAN SECTION     CHOLECYSTECTOMY     CYSTOSCOPY  07/04/2019   Procedure: CYSTOSCOPY;  Surgeon: Sanjuana Kava, MD;  Location: [facility];  Service: Gynecology;;   HYSTERECTOMY ABDOMINAL WITH SALPINGECTOMY Bilateral 07/04/2019   Procedure: ABDOMINAL  suprcervical hysterctomyWITH SALPINGECTOMY ;  Surgeon: Sanjuana Kava, MD;  Location: [facility];  Service: Gynecology;  Laterality: Bilateral;   TOTAL LAPAROSCOPIC HYSTERECTOMY WITH SALPINGECTOMY Bilateral 07/04/2019   Procedure: attemted TOTAL LAPAROSCOPIC HYSTERECTOMY WITH SALPINGECTOMY;  Surgeon: Sanjuana Kava, MD;  Location: [facility];  Service: Gynecology;  Laterality: Bilateral;  3 hours    ROS: Review of Systems Negative except as stated above  PHYSICAL EXAM: BP (!) 145/84   Pulse 75   Temp 98.3 F (36.8 C)   Resp 16   Ht 5' 4.02" (1.626 m)   Wt 256 lb (116.1 kg)   LMP  06/24/2019   SpO2 97%   BMI 43.92 kg/m   Physical Exam HENT:     Head: Normocephalic and atraumatic.  Eyes:     Extraocular Movements: Extraocular movements intact.     Conjunctiva/sclera: Conjunctivae normal.     Pupils: Pupils are equal, round, and reactive to light.  Cardiovascular:     Rate and Rhythm: Normal rate and regular rhythm.     Pulses: Normal pulses.     Heart sounds: Normal heart sounds.  Pulmonary:     Effort: Pulmonary effort is normal.     Breath sounds: Normal breath sounds.  Musculoskeletal:     Cervical back: Normal range of motion and neck supple.  Neurological:     General: No focal deficit present.     Mental Status: She is alert and oriented to person, place, and time.  Psychiatric:        Mood and Affect: Affect is tearful.    ASSESSMENT AND PLAN: 1. Anxiety and depression - Patient denies thoughts of self-harm, suicidal ideations, homicidal ideations. - Sertraline discontinued due to patient side effects.  - Begin Escitalopram as prescribed. Counseled on medication adherence/adverse effects.  - Referral to Psychiatry for further evaluation/management. During the interim patient will follow-up with me in 4 weeks or sooner if needed until established with referral. - Patient provided with work letter. - escitalopram (LEXAPRO) 10 MG tablet; Take 1 tablet (10 mg total) by mouth daily.  Dispense: 30 tablet; Refill: 0 - Ambulatory referral to Psychiatry  2. Hypothyroidism, unspecified type - Routine lab. Will update medication regimen once lab results.  - TSH  3. Encounter for weight management 4. Weight gain - Phentermine as prescribed. Counseled on medication adherence/adverse effects.  - Follow-up with primary provider in 4 weeks or sooner if needed for weight check. - phentermine 15 MG capsule; Take 1 capsule (15 mg total) by mouth every morning.  Dispense: 30 capsule; Refill: 0  5. Elevated blood pressure reading in office without diagnosis of  hypertension - Blood pressure not at goal during today's visit. Patient asymptomatic without chest pressure, chest pain, palpitations, shortness of breath, worst headache of  life, and any additional red flag symptoms. - Today's reading may be secondary to anxiety depression.  - Follow-up with primary provider in 2 to 4 weeks for blood pressure check or sooner if needed.    Patient was given the opportunity to ask questions.  Patient verbalized understanding of the plan and was able to repeat key elements of the plan. Patient was given clear instructions to go to Emergency Department or return to medical center if symptoms don't improve, worsen, or new problems develop.The patient verbalized understanding.   Orders Placed This Encounter  Procedures   TSH   Ambulatory referral to Psychiatry     Requested Prescriptions   Signed Prescriptions Disp Refills   escitalopram (LEXAPRO) 10 MG tablet 30 tablet 0    Sig: Take 1 tablet (10 mg total) by mouth daily.   phentermine 15 MG capsule 30 capsule 0    Sig: Take 1 capsule (15 mg total) by mouth every morning.    Return in about 3 months (around 05/20/2022) for Follow-Up or next available chronic care mgmt and 4 weeks weight check.  Camillia Herter, NP

## 2022-02-18 ENCOUNTER — Telehealth: Payer: Self-pay | Admitting: Family

## 2022-02-18 ENCOUNTER — Encounter: Payer: Self-pay | Admitting: Family

## 2022-02-18 ENCOUNTER — Emergency Department (HOSPITAL_COMMUNITY): Payer: No Typology Code available for payment source

## 2022-02-18 ENCOUNTER — Emergency Department (HOSPITAL_COMMUNITY)
Admission: EM | Admit: 2022-02-18 | Discharge: 2022-02-18 | Discharge status: Against Medical Advice | Payer: No Typology Code available for payment source | Attending: Student | Admitting: Student

## 2022-02-18 ENCOUNTER — Ambulatory Visit (INDEPENDENT_AMBULATORY_CARE_PROVIDER_SITE_OTHER): Payer: No Typology Code available for payment source | Admitting: Family

## 2022-02-18 ENCOUNTER — Other Ambulatory Visit: Payer: Self-pay

## 2022-02-18 ENCOUNTER — Other Ambulatory Visit (HOSPITAL_COMMUNITY): Payer: Self-pay

## 2022-02-18 VITALS — BP 145/84 | HR 75 | Temp 98.3°F | Resp 16 | Ht 64.02 in | Wt 256.0 lb

## 2022-02-18 DIAGNOSIS — R030 Elevated blood-pressure reading, without diagnosis of hypertension: Secondary | ICD-10-CM

## 2022-02-18 DIAGNOSIS — R519 Headache, unspecified: Secondary | ICD-10-CM | POA: Diagnosis present

## 2022-02-18 DIAGNOSIS — R635 Abnormal weight gain: Secondary | ICD-10-CM | POA: Diagnosis not present

## 2022-02-18 DIAGNOSIS — E039 Hypothyroidism, unspecified: Secondary | ICD-10-CM

## 2022-02-18 DIAGNOSIS — Z7689 Persons encountering health services in other specified circumstances: Secondary | ICD-10-CM

## 2022-02-18 DIAGNOSIS — F419 Anxiety disorder, unspecified: Secondary | ICD-10-CM | POA: Diagnosis not present

## 2022-02-18 DIAGNOSIS — Z5321 Procedure and treatment not carried out due to patient leaving prior to being seen by health care provider: Secondary | ICD-10-CM | POA: Insufficient documentation

## 2022-02-18 DIAGNOSIS — F32A Depression, unspecified: Secondary | ICD-10-CM

## 2022-02-18 LAB — CBC WITH DIFFERENTIAL/PLATELET
Abs Immature Granulocytes: 0.02 10*3/uL (ref 0.00–0.07)
Basophils Absolute: 0 10*3/uL (ref 0.0–0.1)
Basophils Relative: 0 %
Eosinophils Absolute: 0.2 10*3/uL (ref 0.0–0.5)
Eosinophils Relative: 2 %
HCT: 38.3 % (ref 36.0–46.0)
Hemoglobin: 12.4 g/dL (ref 12.0–15.0)
Immature Granulocytes: 0 %
Lymphocytes Relative: 42 %
Lymphs Abs: 4 10*3/uL (ref 0.7–4.0)
MCH: 27 pg (ref 26.0–34.0)
MCHC: 32.4 g/dL (ref 30.0–36.0)
MCV: 83.4 fL (ref 80.0–100.0)
Monocytes Absolute: 0.7 10*3/uL (ref 0.1–1.0)
Monocytes Relative: 7 %
Neutro Abs: 4.6 10*3/uL (ref 1.7–7.7)
Neutrophils Relative %: 49 %
Platelets: 284 10*3/uL (ref 150–400)
RBC: 4.59 MIL/uL (ref 3.87–5.11)
RDW: 13.7 % (ref 11.5–15.5)
WBC: 9.5 10*3/uL (ref 4.0–10.5)
nRBC: 0 % (ref 0.0–0.2)

## 2022-02-18 LAB — BASIC METABOLIC PANEL
Anion gap: 9 (ref 5–15)
BUN: 8 mg/dL (ref 6–20)
CO2: 25 mmol/L (ref 22–32)
Calcium: 9.1 mg/dL (ref 8.9–10.3)
Chloride: 106 mmol/L (ref 98–111)
Creatinine, Ser: 0.79 mg/dL (ref 0.44–1.00)
GFR, Estimated: >60 mL/min (ref >60)
Glucose, Bld: 109 mg/dL — ABNORMAL HIGH (ref 70–99)
Potassium: 3.5 mmol/L (ref 3.5–5.1)
Sodium: 140 mmol/L (ref 135–145)

## 2022-02-18 MED ORDER — PHENTERMINE HCL 15 MG PO CAPS
15.0000 mg | ORAL_CAPSULE | ORAL | 0 refills | Status: DC
  Filled 2022-02-18: qty 30, 30d supply, fill #0

## 2022-02-18 MED ORDER — ESCITALOPRAM OXALATE 10 MG PO TABS
10.0000 mg | ORAL_TABLET | Freq: Every day | ORAL | 0 refills | Status: DC
  Filled 2022-02-18: qty 30, 30d supply, fill #0

## 2022-02-18 NOTE — Telephone Encounter (Signed)
Opened in error

## 2022-02-18 NOTE — ED Notes (Signed)
Pt called to let me know she left/

## 2022-02-18 NOTE — ED Triage Notes (Signed)
Patient reports her head has been hurting and her blood pressure has been up. Sitting on couch today and felt a "pop" on right side of head  (temple area) and she has felt funny since. Headache on right side only now. Pulsating. Pain rated 10/10 in triage. Denies blurred vision, dizziness. No facial droop or unilateral weakness in triage. Denies history of CVA.

## 2022-02-18 NOTE — Patient Instructions (Signed)
Levothyroxine; Liothyronine Tablets What is this medication? LEVOTHYROXINE (lee voe thye ROX een) and LIOTHYRONINE (lye oh THYE roe neen) treats low thyroid levels (hypothyroidism) in your body. It works by replacing thyroid hormones normally made by the body. Thyroid hormones play an important role in your overall health. They help support metabolism and energy levels. This medicine may be used for other purposes; ask your health care provider or pharmacist if you have questions. COMMON BRAND NAME(S): Thyrolar What should I tell my care team before I take this medication? They need to know if you have any of these conditions: Addison's disease or other adrenal gland problem Angina Bone problems Diabetes Dieting or on a weight loss program Fertility problems Heart disease Pituitary gland problem Take medications that treat or prevent blood clots An unusual or allergic reaction to levothyroxine, liothyronine, other medications, foods, dyes, or preservatives Pregnant or trying to get pregnant Breast-feeding How should I use this medication? Take this medication by mouth with water. Take it as directed on the prescription label at the same time every day. It is best to take on an empty stomach, at least 30 minutes to one hour before breakfast. Keep taking it unless your health care provider tells you to stop. Talk to your care team about the use of this medication in children. While this medication may be prescribed for children for selected conditions, precautions do apply. Overdosage: If you think you have taken too much of this medicine contact a poison control center or emergency room at once. NOTE: This medicine is only for you. Do not share this medicine with others. What if I miss a dose? If you miss a dose, take it as soon as you can. If it is almost time for your next dose, take only that dose. Do not take double or extra doses. What may interact with this  medication? Amiodarone Antacids Calcium supplements Carbamazepine Certain medications for depression Certain medications to treat cancer Cholestyramine Clofibrate Colesevelam Colestipol Digoxin Estrogen or progestin hormones Iron supplements Ketamine Liquid nutrition products, such as Ensure Lithium Medications for colds and breathing difficulties Medications for diabetes Medications for hyperthyroidsm Medications or dietary supplements for weight loss Methadone Niacin Orlistat Oxandrolone Phenobarbital or other barbiturates Phenytoin Rifampin Sevelamer Simethicone Sodium polystyrene sulfonate Soy isoflavones Steroid medications, such as prednisone or cortisone Sucralfate Testosterone Theophylline Warfarin This list may not describe all possible interactions. Give your health care provider a list of all the medicines, herbs, non-prescription drugs, or dietary supplements you use. Also tell them if you smoke, drink alcohol, or use illegal drugs. Some items may interact with your medicine. What should I watch for while using this medication? Do not switch brands of this medication unless your care team agrees with the change. Ask questions if you are uncertain. You will need regular exams and occasional blood tests to check the response to treatment. If you are receiving this medication for an underactive thyroid, it may be several weeks before you notice an improvement. Check with your care team if your symptoms do not improve. It may be necessary for you to take this medication for the rest of your life. Do not stop using this medication unless your care team advises you to. This medication can affect blood sugar levels. If you have diabetes, check your blood sugar as directed. You may lose some of your hair when you first start treatment. With time, this usually corrects itself. What side effects may I notice from receiving this medication? Side effects that  you should  report to your care team as soon as possible: Allergic reactions--skin rash, itching, hives, swelling of the face, lips, tongue, or throat Anxiety, nervousness Excessive sweating or sensitivity to heat Fever Heart palpitations--rapid, pounding, or irregular heartbeat Heart rhythm changes--fast or irregular heartbeat, dizziness, feeling faint or lightheaded, chest pain, trouble breathing Irregular menstrual cycles or spotting Severe diarrhea Tremors or shaking Trouble sleeping Side effects that usually do not require medical attention (report to your care team if they continue or are bothersome): Change in appetite Hair loss Headache Nausea Vomiting This list may not describe all possible side effects. Call your doctor for medical advice about side effects. You may report side effects to FDA at 1-800-FDA-1088. Where should I keep my medication? Keep out of the reach of children and pets. Store in the refrigerator between 2 and 8 degrees C (36 and 46 degrees F). Keep container tightly closed. Protect from light. Throw away any unused medication after the expiration date. NOTE: This sheet is a summary. It may not cover all possible information. If you have questions about this medicine, talk to your doctor, pharmacist, or health care provider.  2023 Elsevier/Gold Standard (2020-04-14 00:00:00)

## 2022-02-18 NOTE — Progress Notes (Signed)
Pt presents for weight check -pt states that she was not able to pick up Phentermine due to pharmacies being out of stock  -pt wants something for anxiety, and stress request 3 days off work,  -states she will speak to HR for FMLA paperwork

## 2022-02-18 NOTE — ED Provider Triage Note (Signed)
Emergency Medicine Provider Triage Evaluation Note  Heather Campos , a 46 y.o. female  was evaluated in triage.  Pt complains of sudden onset right-sided headache, currently rated 10 out of 10 in severity.  Patient was seen at primary care earlier today states she had a generalized headache at that time.  She has been under lots of stress and is now having anxiety, was written out of work for 3 days for rest.  She states that upon arriving home she felt a "pop" in the right side of her head and had sudden pain rated 10 out of 10 in severity.  She denies any previous headache like this.  She denies losing consciousness.  Review of Systems  Positive: As above Negative: As above  Physical Exam  BP (!) 139/102 (BP Location: Left Arm)   Pulse 91   Temp 98.2 F (36.8 C) (Oral)   Resp 18   LMP 06/24/2019   SpO2 100%  Gen:   Awake, no distress   Resp:  Normal effort  MSK:   Moves extremities without difficulty  Other:    Medical Decision Making  Medically screening exam initiated at 4:13 PM.  Appropriate orders placed.  Carmaleta Youngers was informed that the remainder of the evaluation will be completed by another provider, this initial triage assessment does not replace that evaluation, and the importance of remaining in the ED until their evaluation is complete.     Dorothyann Peng, PA-C 02/18/22 1619

## 2022-02-19 ENCOUNTER — Other Ambulatory Visit: Payer: Self-pay | Admitting: Family

## 2022-02-19 ENCOUNTER — Telehealth: Payer: No Typology Code available for payment source | Admitting: Nurse Practitioner

## 2022-02-19 ENCOUNTER — Encounter: Payer: Self-pay | Admitting: Family

## 2022-02-19 DIAGNOSIS — E038 Other specified hypothyroidism: Secondary | ICD-10-CM

## 2022-02-19 DIAGNOSIS — G44209 Tension-type headache, unspecified, not intractable: Secondary | ICD-10-CM

## 2022-02-19 LAB — TSH: TSH: 3.67 u[IU]/mL (ref 0.450–4.500)

## 2022-02-19 MED ORDER — LEVOTHYROXINE SODIUM 137 MCG PO TABS: 137.0000 ug | ORAL_TABLET | Freq: Every day | ORAL | 2 refills | Status: DC

## 2022-02-19 MED ORDER — BUTALBITAL-APAP-CAFFEINE 50-300-40 MG PO CAPS: 1.0000 | ORAL_CAPSULE | Freq: Four times a day (QID) | ORAL | 0 refills | Status: DC | PRN

## 2022-02-19 NOTE — Progress Notes (Signed)
Virtual Visit Consent   Heather Campos, you are scheduled for a virtual visit with a [place] provider today. Just as with appointments in the office, your consent must be obtained to participate. Your consent will be active for this visit and any virtual visit you may have with one of our providers in the next 365 days. If you have a MyChart account, a copy of this consent can be sent to you electronically.  As this is a virtual visit, video technology does not allow for your provider to perform a traditional examination. This may limit your provider's ability to fully assess your condition. If your provider identifies any concerns that need to be evaluated in person or the need to arrange testing (such as labs, EKG, etc.), we will make arrangements to do so. Although advances in technology are sophisticated, we cannot ensure that it will always work on either your end or our end. If the connection with a video visit is poor, the visit may have to be switched to a telephone visit. With either a video or telephone visit, we are not always able to ensure that we have a secure connection.  By engaging in this virtual visit, you consent to the provision of healthcare and authorize for your insurance to be billed (if applicable) for the services provided during this visit. Depending on your insurance coverage, you may receive a charge related to this service.  I need to obtain your verbal consent now. Are you willing to proceed with your visit today? Heather Campos has provided verbal consent on 02/19/2022 for a virtual visit (video or telephone). Heather Pounds, NP  Date: 02/19/2022 7:23 PM  Virtual Visit via Video Note   I, Heather Campos, connected with  Heather Campos  ([id number], Sep 02, 1975) on 02/19/22 at  7:00 PM EST by a video-enabled telemedicine application and verified that I am speaking with the correct person using two identifiers.  Location: Patient: Virtual Visit Location  Patient: Home Provider: Virtual Visit Location Provider: Home Office   I discussed the limitations of evaluation and management by telemedicine and the availability of in person appointments. The patient expressed understanding and agreed to proceed.    History of Present Illness: Heather Campos is a 46 y.o. who identifies as a female who was assigned female at birth, and is being seen today for unilateral right sided headache.  Left the ED AMA due to long wait time last night. CT from ED visit did not show any acute intracranial findings. PCP will need to follow up for possible "empty sella" noted on imaging report. She does note BPs have been elevated with average readings from home monitor: 130-140/90-100s. She has been taking tylenol for headaches which have caused the pain to ease however pressure is still present. Pain described as pulsating. She denies visual changes, hemiparesis or facial numbness/droop. Headaches occurring in the temporal area. She states last night she  was sitting on the couch with a headache and felta  pop on the right side of her head and has not been feeling herself since then and this is what she went to the ED for yesterday. She has no prior history of HTN until just several weeks ago and states she has not taken phentermine since October   Problems:  Patient Active Problem List   Diagnosis Date Noted   Anxiety and depression 12/24/2021   Cerumen debris on tympanic membrane of left ear 09/30/2021   Hypothyroidism 08/20/2021   Chronic pain 08/17/2021  Lumbar spondylosis 06/29/2021   Bilateral carpal tunnel syndrome 06/23/2021   Pain in joint of left knee 02/04/2021   COVID-19 11/27/2020   Class 3 severe obesity due to excess calories without serious comorbidity with body mass index (BMI) of 40.0 to 44.9 in adult ([place]) 11/27/2020   Pain in joint of right shoulder 10/06/2020   Calcific tendinitis of right shoulder 10/06/2020   Low back pain 08/13/2020    Insomnia 04/14/2020   Pain of toe of right foot 02/24/2020   Stiffness of left shoulder joint 11/14/2019   Adhesive capsulitis of left shoulder 10/02/2019   Shoulder pain, left 09/03/2019   Calcific tendinitis of left shoulder 09/03/2019   Fibroid uterus 07/04/2019   Uterine fibroid 07/04/2019   Tachycardia 05/14/2019   History of COVID-19 05/13/2019   Hair loss 05/13/2019   OSA (obstructive sleep apnea) 02/09/2016   Family history of systemic lupus erythematosus 02/09/2016   Vitamin D deficiency 09/02/2015   Morbid obesity due to excess calories ([place]) 09/02/2015   Iron deficiency anemia due to chronic blood loss 09/02/2015   Hypothyroidism due to Hashimoto's thyroiditis 09/02/2015    Allergies:  Allergies  Allergen Reactions   No Known Allergies    Medications:  Current Outpatient Medications:    Butalbital-APAP-Caffeine 50-300-40 MG CAPS, Take 1 tablet by mouth every 6 (six) hours as needed., Disp: 40 capsule, Rfl: 0   Azelaic Acid 15 % gel, 1 application Externally Twice a day for 30 days, Disp: , Rfl:    clindamycin (CLEOCIN T) 1 % lotion, Apply 1 application topically daily. , Disp: , Rfl:    diazepam (VALIUM) 5 MG tablet, Take 1 tablet (5 mg total) by mouth every 12 (twelve) hours as needed for up to 2 doses for anxiety., Disp: 2 tablet, Rfl: 0   doxycycline (VIBRA-TABS) 100 MG tablet, Take 100 mg by mouth daily., Disp: , Rfl:    escitalopram (LEXAPRO) 10 MG tablet, Take 1 tablet (10 mg total) by mouth daily., Disp: 30 tablet, Rfl: 0   gabapentin (NEURONTIN) 300 MG capsule, Take 300 mg by mouth daily., Disp: , Rfl:    gabapentin (NEURONTIN) 300 MG capsule, Take 1 capsule by mouth once daily at bedtime, Disp: 30 capsule, Rfl: 3   gabapentin (NEURONTIN) 300 MG capsule, Take 1 capsule (300 mg total) by mouth at bedtime., Disp: 30 capsule, Rfl: 3   HYDROcodone-acetaminophen (NORCO) 10-325 MG tablet, Take 1 tablet by mouth every 8 (eight) hours as needed., Disp: 60 tablet, Rfl: 0    hydrOXYzine (VISTARIL) 25 MG capsule, Take 1 capsule (25 mg total) by mouth every 8 (eight) hours as needed., Disp: 30 capsule, Rfl: 0   levothyroxine (SYNTHROID) 137 MCG tablet, Take 1 tablet (137 mcg total) by mouth daily., Disp: 30 tablet, Rfl: 2   meloxicam (MOBIC) 15 MG tablet, Take 15 mg by mouth daily., Disp: , Rfl:    meloxicam (MOBIC) 15 MG tablet, Take 1 tablet by mouth every day as needed., Disp: 30 tablet, Rfl: 3   meloxicam (MOBIC) 15 MG tablet, Take 1 tablet (15 mg total) by mouth daily as needed., Disp: 30 tablet, Rfl: 3   metFORMIN (GLUCOPHAGE) 500 MG tablet, Take 500 mg by mouth 3 (three) times daily., Disp: , Rfl:    omeprazole (PRILOSEC) 40 MG capsule, Take 1 capsule (40 mg total) by mouth daily as needed., Disp: 90 capsule, Rfl: 0   oxyCODONE-acetaminophen (PERCOCET/ROXICET) 5-325 MG tablet, Take 1 tablet by mouth every 8 (eight) hours as needed., Disp: ,  Rfl:    phentermine 15 MG capsule, Take 1 capsule (15 mg total) by mouth every morning., Disp: 30 capsule, Rfl: 0   Vitamin D, Ergocalciferol, (DRISDOL) 1.25 MG (50000 UNIT) CAPS capsule, Take 1 capsule (50,000 Units total) by mouth every 7 (seven) days., Disp: 4 capsule, Rfl: 2  Observations/Objective: Patient is well-developed, well-nourished in no acute distress.  Resting comfortably  at home.  Head is normocephalic, atraumatic.  No labored breathing.  Speech is clear and coherent with logical content.  Patient is alert and oriented at baseline.    Assessment and Plan: 1. Acute non intractable tension-type headache - Butalbital-APAP-Caffeine 50-300-40 MG CAPS; Take 1 tablet by mouth every 6 (six) hours as needed.  Dispense: 40 capsule; Refill: 0 Return to ED if headache worsens, vision changes or loss of sensation In face or extremities.   Follow Up Instructions: I discussed the assessment and treatment plan with the patient. The patient was provided an opportunity to ask questions and all were answered. The patient  agreed with the plan and demonstrated an understanding of the instructions.  A copy of instructions were sent to the patient via MyChart unless otherwise noted below.    The patient was advised to call back or seek an in-person evaluation if the symptoms worsen or if the condition fails to improve as anticipated.  Time:  I spent 11 minutes with the patient via telehealth technology discussing the above problems/concerns.    Heather Pounds, NP

## 2022-02-19 NOTE — Patient Instructions (Signed)
Heather Campos, thank you for joining Gildardo Pounds, NP for today's virtual visit.  While this provider is not your primary care provider (PCP), if your PCP is located in our provider database this encounter information will be shared with them immediately following your visit.   [facility] account gives you access to today's visit and all your visits, tests, and labs performed at [facility] " click here if you don't have a [facility] account or go to [email or web address]  Consent: (Patient) Heather Campos provided verbal consent for this virtual visit at the beginning of the encounter.  Current Medications:  Current Outpatient Medications:    Butalbital-APAP-Caffeine 50-300-40 MG CAPS, Take 1 tablet by mouth every 6 (six) hours as needed., Disp: 40 capsule, Rfl: 0   Azelaic Acid 15 % gel, 1 application Externally Twice a day for 30 days, Disp: , Rfl:    clindamycin (CLEOCIN T) 1 % lotion, Apply 1 application topically daily. , Disp: , Rfl:    diazepam (VALIUM) 5 MG tablet, Take 1 tablet (5 mg total) by mouth every 12 (twelve) hours as needed for up to 2 doses for anxiety., Disp: 2 tablet, Rfl: 0   doxycycline (VIBRA-TABS) 100 MG tablet, Take 100 mg by mouth daily., Disp: , Rfl:    escitalopram (LEXAPRO) 10 MG tablet, Take 1 tablet (10 mg total) by mouth daily., Disp: 30 tablet, Rfl: 0   gabapentin (NEURONTIN) 300 MG capsule, Take 300 mg by mouth daily., Disp: , Rfl:    gabapentin (NEURONTIN) 300 MG capsule, Take 1 capsule by mouth once daily at bedtime, Disp: 30 capsule, Rfl: 3   gabapentin (NEURONTIN) 300 MG capsule, Take 1 capsule (300 mg total) by mouth at bedtime., Disp: 30 capsule, Rfl: 3   HYDROcodone-acetaminophen (NORCO) 10-325 MG tablet, Take 1 tablet by mouth every 8 (eight) hours as needed., Disp: 60 tablet, Rfl: 0   hydrOXYzine (VISTARIL) 25 MG capsule, Take 1 capsule (25 mg total) by mouth every 8 (eight) hours as needed., Disp: 30  capsule, Rfl: 0   levothyroxine (SYNTHROID) 137 MCG tablet, Take 1 tablet (137 mcg total) by mouth daily., Disp: 30 tablet, Rfl: 2   meloxicam (MOBIC) 15 MG tablet, Take 15 mg by mouth daily., Disp: , Rfl:    meloxicam (MOBIC) 15 MG tablet, Take 1 tablet by mouth every day as needed., Disp: 30 tablet, Rfl: 3   meloxicam (MOBIC) 15 MG tablet, Take 1 tablet (15 mg total) by mouth daily as needed., Disp: 30 tablet, Rfl: 3   metFORMIN (GLUCOPHAGE) 500 MG tablet, Take 500 mg by mouth 3 (three) times daily., Disp: , Rfl:    omeprazole (PRILOSEC) 40 MG capsule, Take 1 capsule (40 mg total) by mouth daily as needed., Disp: 90 capsule, Rfl: 0   oxyCODONE-acetaminophen (PERCOCET/ROXICET) 5-325 MG tablet, Take 1 tablet by mouth every 8 (eight) hours as needed., Disp: , Rfl:    phentermine 15 MG capsule, Take 1 capsule (15 mg total) by mouth every morning., Disp: 30 capsule, Rfl: 0   Vitamin D, Ergocalciferol, (DRISDOL) 1.25 MG (50000 UNIT) CAPS capsule, Take 1 capsule (50,000 Units total) by mouth every 7 (seven) days., Disp: 4 capsule, Rfl: 2   Medications ordered in this encounter:  Meds ordered this encounter  Medications   Butalbital-APAP-Caffeine 50-300-40 MG CAPS    Sig: Take 1 tablet by mouth every 6 (six) hours as needed.    Dispense:  40 capsule    Refill:  0  Order Specific Question:   Supervising Provider    Answer:   Chase Picket [[id number]]     *If you need refills on other medications prior to your next appointment, please contact your pharmacy*  Follow-Up: Call back or seek an in-person evaluation if the symptoms worsen or if the condition fails to improve as anticipated.  [place] [phone]  Other Instructions Return to ED if headache worsens, vision changes or loss of sensation In face or extremities.    If you have been instructed to have an in-person evaluation today at a local Urgent Care facility, please use the link below. It will take you to a  list of all of our available [facility], including address, phone number and hours of operation. Please do not delay care.  [facility]  If you or a family member do not have a primary care provider, use the link below to schedule a visit and establish care. When you choose a [facility] primary care physician or advanced practice provider, you gain a long-term partner in health. Find a Primary Care Provider  Learn more about [facility] [place]'s in-office and virtual care options: [facility]

## 2022-02-20 ENCOUNTER — Emergency Department (HOSPITAL_COMMUNITY): Payer: No Typology Code available for payment source

## 2022-02-20 ENCOUNTER — Encounter (HOSPITAL_COMMUNITY): Payer: Self-pay

## 2022-02-20 ENCOUNTER — Emergency Department (HOSPITAL_COMMUNITY)
Admission: EM | Admit: 2022-02-20 | Discharge: 2022-02-20 | Disposition: A | Discharge status: Home / Self Care | Payer: No Typology Code available for payment source | Attending: Emergency Medicine | Admitting: Emergency Medicine

## 2022-02-20 DIAGNOSIS — R519 Headache, unspecified: Secondary | ICD-10-CM | POA: Insufficient documentation

## 2022-02-20 DIAGNOSIS — Z1152 Encounter for screening for COVID-19: Secondary | ICD-10-CM | POA: Insufficient documentation

## 2022-02-20 LAB — CBC WITH DIFFERENTIAL/PLATELET
Abs Immature Granulocytes: 0.01 10*3/uL (ref 0.00–0.07)
Basophils Absolute: 0 10*3/uL (ref 0.0–0.1)
Basophils Relative: 0 %
Eosinophils Absolute: 0.1 10*3/uL (ref 0.0–0.5)
Eosinophils Relative: 2 %
HCT: 38.7 % (ref 36.0–46.0)
Hemoglobin: 12.8 g/dL (ref 12.0–15.0)
Immature Granulocytes: 0 %
Lymphocytes Relative: 42 %
Lymphs Abs: 3.3 10*3/uL (ref 0.7–4.0)
MCH: 27.5 pg (ref 26.0–34.0)
MCHC: 33.1 g/dL (ref 30.0–36.0)
MCV: 83 fL (ref 80.0–100.0)
Monocytes Absolute: 0.6 10*3/uL (ref 0.1–1.0)
Monocytes Relative: 7 %
Neutro Abs: 3.9 10*3/uL (ref 1.7–7.7)
Neutrophils Relative %: 49 %
Platelets: 283 10*3/uL (ref 150–400)
RBC: 4.66 MIL/uL (ref 3.87–5.11)
RDW: 13.6 % (ref 11.5–15.5)
WBC: 8 10*3/uL (ref 4.0–10.5)
nRBC: 0 % (ref 0.0–0.2)

## 2022-02-20 LAB — RESP PANEL BY RT-PCR (RSV, FLU A&B, COVID)  RVPGX2
Influenza A by PCR: NEGATIVE
Influenza B by PCR: NEGATIVE
Resp Syncytial Virus by PCR: NEGATIVE
SARS Coronavirus 2 by RT PCR: NEGATIVE

## 2022-02-20 LAB — BASIC METABOLIC PANEL
Anion gap: 8 (ref 5–15)
BUN: 6 mg/dL (ref 6–20)
CO2: 25 mmol/L (ref 22–32)
Calcium: 8.9 mg/dL (ref 8.9–10.3)
Chloride: 105 mmol/L (ref 98–111)
Creatinine, Ser: 0.74 mg/dL (ref 0.44–1.00)
GFR, Estimated: >60 mL/min (ref >60)
Glucose, Bld: 100 mg/dL — ABNORMAL HIGH (ref 70–99)
Potassium: 3.8 mmol/L (ref 3.5–5.1)
Sodium: 138 mmol/L (ref 135–145)

## 2022-02-20 LAB — SEDIMENTATION RATE: Sed Rate: 16 mm/hr (ref 0–22)

## 2022-02-20 MED ORDER — ACETAMINOPHEN 500 MG PO TABS
1000.0000 mg | ORAL_TABLET | Freq: Once | ORAL | Status: AC
  Administered 2022-02-20: 1000 mg via ORAL
  Filled 2022-02-20: qty 2

## 2022-02-20 MED ORDER — DIPHENHYDRAMINE HCL 50 MG/ML IJ SOLN
50.0000 mg | Freq: Once | INTRAMUSCULAR | Status: AC
  Administered 2022-02-20: 50 mg via INTRAVENOUS
  Filled 2022-02-20: qty 1

## 2022-02-20 MED ORDER — SODIUM CHLORIDE 0.9 % IV BOLUS
1000.0000 mL | Freq: Once | INTRAVENOUS | Status: AC
  Administered 2022-02-20: 1000 mL via INTRAVENOUS

## 2022-02-20 MED ORDER — PROCHLORPERAZINE EDISYLATE 10 MG/2ML IJ SOLN
10.0000 mg | Freq: Once | INTRAMUSCULAR | Status: AC
  Administered 2022-02-20: 10 mg via INTRAVENOUS
  Filled 2022-02-20: qty 2

## 2022-02-20 NOTE — ED Notes (Signed)
Pt to MRI at this time.

## 2022-02-20 NOTE — ED Triage Notes (Signed)
Pt arrived via POV, c/o continued headache. States she was seen the other day but left from the lobby before finishing care. Pt has seen PCP and taken OTC and prescribed hydrocodone with no relief. PCP advised pt to come to ED for MRI. No neurological deficits noted.

## 2022-02-20 NOTE — ED Provider Triage Note (Signed)
Emergency Medicine Provider Triage Evaluation Note  Heather Campos , a 46 y.o. female  was evaluated in triage.  Pt complains of headache.  Started 3 days ago acutely, was seen in the ED was noted to be hypertensive and had a CT scan done but left before being seen the back due to prolonged wait time.  Had a virtual care visit with her doctor the next day, they prescribed Fioricet which she has been unable to pick up due to pharmacy being closed.  She has been taking hydrocodone and Tylenol for the headache but is not making any difference.  She is here because the pain has been severe and she was told to come to the ED for emergent MRI by her primary if the headache persisted.    IMPRESSION: CT Head 02/18/22 No acute intracranial findings are seen. There are no signs of bleeding or any focal mass effect. Ventricles are not dilated.   Sella is larger than usual with increased amount of CSF suggesting empty sella. If clinically warranted, follow-up MR brain in nonemergent setting may be considered.    Review of Systems  Per HPI  Physical Exam  BP (!) 178/89 (BP Location: Left Arm)   Pulse 72   Temp 99 F (37.2 C) (Oral)   Resp 20   LMP 06/24/2019   SpO2 100%  Gen:   Awake, no distress   Resp:  Normal effort  MSK:   Moves extremities without difficulty  Other:  Sensation to right side of face slightly decreased compared to left per patient.  Also tenderness over right temporal area on palpation.  Cranial nerves II through XII are grossly intact.  Upper and lower extremity strength symmetric bilaterally.  No pronator drift.  I do not appreciate any papilledema on funduscopy.  Medical Decision Making  Medically screening exam initiated at 8:18 AM.  Appropriate orders placed.  MACKENA PLUMMER was informed that the remainder of the evaluation will be completed by another provider, this initial triage assessment does not replace that evaluation, and the importance of remaining in the ED  until their evaluation is complete.     Sherrill Raring, PA-C 02/20/22 [phone]

## 2022-02-20 NOTE — Discharge Instructions (Signed)
Your imaging today did not show evidence of acute mass, stroke, or bleed.  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  As your symptoms have completely resolved and headache is gone, we agreed to hold on more extensive workup such as lumbar puncture or neurology consultation.  Please rest and stay hydrated and pick up the prescription for medication they prescribed yesterday.  If any symptoms change or worsen acutely, please return to the nearest emergency department.

## 2022-02-20 NOTE — ED Provider Notes (Signed)
[place] DEPT Provider Note   CSN: [id number] Arrival date & time: 02/20/22  0747     History  Chief Complaint  Patient presents with   Headache    Heather Campos is a 46 y.o. female.  The history is provided by the patient, medical records and the spouse. No language interpreter was used.  Headache Pain location:  R temporal Quality:  Dull and sharp Radiates to:  Does not radiate Severity currently:  8/10 Severity at highest:  10/10 Onset quality:  Gradual Duration:  3 days Timing:  Constant Progression:  Waxing and waning Chronicity:  New Similar to prior headaches: no   Context: bright light and loud noise   Relieved by:  Nothing Worsened by:  Nothing Ineffective treatments:  None tried Associated symptoms: photophobia   Associated symptoms: no abdominal pain, no back pain, no blurred vision, no congestion, no cough, no diarrhea, no dizziness, no eye pain, no facial pain, no fatigue, no fever, no focal weakness, no loss of balance, no nausea, no neck pain, no neck stiffness, no numbness, no paresthesias, no seizures, no syncope, no visual change, no vomiting and no weakness        Home Medications Prior to Admission medications   Medication Sig Start Date End Date Taking? Authorizing Provider  Azelaic Acid 15 % gel 1 application Externally Twice a day for 30 days 12/22/21 03/21/22  [provider]  Butalbital-APAP-Caffeine 50-300-40 MG CAPS Take 1 tablet by mouth every 6 (six) hours as needed. 02/19/22   Gildardo Pounds, NP  clindamycin (CLEOCIN T) 1 % lotion Apply 1 application topically daily.  06/03/19   [provider]  diazepam (VALIUM) 5 MG tablet Take 1 tablet (5 mg total) by mouth every 12 (twelve) hours as needed for up to 2 doses for anxiety. 11/22/21   Camillia Herter, NP  escitalopram (LEXAPRO) 10 MG tablet Take 1 tablet (10 mg total) by mouth daily. 02/18/22 03/20/22  Camillia Herter, NP  gabapentin  (NEURONTIN) 300 MG capsule Take 300 mg by mouth daily. 12/13/19   [provider]  gabapentin (NEURONTIN) 300 MG capsule Take 1 capsule by mouth once daily at bedtime 01/04/22     gabapentin (NEURONTIN) 300 MG capsule Take 1 capsule (300 mg total) by mouth at bedtime. 01/26/22   Pedro Earls, MD  HYDROcodone-acetaminophen (NORCO) 10-325 MG tablet Take 1 tablet by mouth every 8 (eight) hours as needed. 01/26/22     hydrOXYzine (VISTARIL) 25 MG capsule Take 1 capsule (25 mg total) by mouth every 8 (eight) hours as needed. 12/24/21   Camillia Herter, NP  levothyroxine (SYNTHROID) 137 MCG tablet Take 1 tablet (137 mcg total) by mouth daily. 02/19/22 05/20/22  Camillia Herter, NP  meloxicam (MOBIC) 15 MG tablet Take 15 mg by mouth daily. 12/05/21   [provider]  meloxicam (MOBIC) 15 MG tablet Take 1 tablet by mouth every day as needed. 01/04/22     meloxicam (MOBIC) 15 MG tablet Take 1 tablet (15 mg total) by mouth daily as needed. 01/26/22     metFORMIN (GLUCOPHAGE) 500 MG tablet Take 500 mg by mouth 3 (three) times daily. 12/08/21   [provider]  omeprazole (PRILOSEC) 40 MG capsule Take 1 capsule (40 mg total) by mouth daily as needed. 11/17/21 02/15/22  Camillia Herter, NP  oxyCODONE-acetaminophen (PERCOCET/ROXICET) 5-325 MG tablet Take 1 tablet by mouth every 8 (eight) hours as needed. 12/21/21   [provider]  phentermine 15 MG capsule Take 1 capsule (15 mg total) by mouth every morning. 02/18/22 03/20/22  Camillia Herter, NP  Vitamin D, Ergocalciferol, (DRISDOL) 1.25 MG (50000 UNIT) CAPS capsule Take 1 capsule (50,000 Units total) by mouth every 7 (seven) days. 03/08/21   Camillia Herter, NP      Allergies    No known allergies    Review of Systems   Review of Systems  Constitutional:  Negative for chills, fatigue and fever.  HENT:  Negative for congestion.   Eyes:  Positive for photophobia. Negative for blurred vision and pain.  Respiratory:  Negative  for cough, chest tightness, shortness of breath and wheezing.   Cardiovascular:  Negative for chest pain and syncope.  Gastrointestinal:  Negative for abdominal pain, constipation, diarrhea, nausea and vomiting.  Genitourinary:  Negative for dysuria and flank pain.  Musculoskeletal:  Negative for back pain, neck pain and neck stiffness.  Skin:  Negative for rash and wound.  Neurological:  Positive for headaches. Negative for dizziness, tremors, focal weakness, seizures, facial asymmetry, weakness, light-headedness, numbness, paresthesias and loss of balance.  Psychiatric/Behavioral:  Negative for agitation and confusion.   All other systems reviewed and are negative.   Physical Exam Updated Vital Signs BP (!) 153/90 (BP Location: Left Arm)   Pulse 69   Temp 99 F (37.2 C) (Oral)   Resp 16   LMP 06/24/2019   SpO2 100%  Physical Exam Vitals and nursing note reviewed.  Constitutional:      General: She is not in acute distress.    Appearance: She is well-developed. She is not ill-appearing, toxic-appearing or diaphoretic.  HENT:     Head: Atraumatic.      Comments: Mild tenderness on right temple and right TMJ area. Eyes:     Extraocular Movements: Extraocular movements intact.     Conjunctiva/sclera: Conjunctivae normal.     Pupils: Pupils are equal, round, and reactive to light. Pupils are equal.  Cardiovascular:     Rate and Rhythm: Normal rate and regular rhythm.     Heart sounds: No murmur heard. Pulmonary:     Effort: Pulmonary effort is normal. No respiratory distress.     Breath sounds: Normal breath sounds. No wheezing, rhonchi or rales.  Chest:     Chest wall: No tenderness.  Abdominal:     Palpations: Abdomen is soft. There is no mass.     Tenderness: There is no abdominal tenderness.  Musculoskeletal:        General: No swelling.     Cervical back: Normal range of motion and neck supple.  Skin:    General: Skin is warm and dry.     Capillary Refill: Capillary  refill takes less than 2 seconds.  Neurological:     General: No focal deficit present.     Mental Status: She is alert and oriented to person, place, and time.     GCS: GCS eye subscore is 4. GCS verbal subscore is 5. GCS motor subscore is 6.     Cranial Nerves: No cranial nerve deficit or dysarthria.     Sensory: Sensation is intact.     Motor: No weakness, tremor, abnormal muscle tone or seizure activity.     Coordination: Finger-Nose-Finger Test normal.  Psychiatric:        Mood and Affect: Mood normal. Mood is not anxious.     ED Results / Procedures / Treatments   Labs (all labs ordered are listed, but only  abnormal results are displayed) Labs Reviewed  BASIC METABOLIC PANEL - Abnormal; Notable for the following components:      Result Value   Glucose, Bld 100 (*)    All other components within normal limits  RESP PANEL BY RT-PCR (RSV, FLU A&B, COVID)  RVPGX2  CBC WITH DIFFERENTIAL/PLATELET  SEDIMENTATION RATE    EKG None  Radiology MR BRAIN WO CONTRAST  Result Date: 02/20/2022 CLINICAL DATA:  46 year old female with headache, hypertension. EXAM: MRI HEAD WITHOUT CONTRAST TECHNIQUE: Multiplanar, multiecho pulse sequences of the brain and surrounding structures were obtained without intravenous contrast. COMPARISON:  Head CT 02/18/2022. FINDINGS: Brain: Normal cerebral volume. No restricted diffusion to suggest acute infarction. No midline shift, mass effect, evidence of mass lesion, ventriculomegaly, extra-axial collection or acute intracranial hemorrhage. Cervicomedullary junction is within normal limits. Partially empty sella. Pearline Cables and white matter signal is within normal limits throughout the brain. No cortical encephalomalacia or chronic cerebral blood products identified. Deep gray matter nuclei, brainstem and cerebellum appear negative. Vascular: Major intracranial vascular flow voids are preserved. Skull and upper cervical spine: Negative visible cervical spine.  Visualized bone marrow signal is within normal limits. Sinuses/Orbits: Normal orbits. Paranasal Visualized paranasal sinuses and mastoids are stable and well aerated. Other: Visible internal auditory structures appear normal. Negative visible scalp and face. IMPRESSION: 1. Partially empty sella, which can be a normal anatomic variant or be associated with idiopathic intracranial hypertension (pseudotumor cerebri). 2. Otherwise normal for age noncontrast MRI appearance of the Brain. Electronically Signed   By: Genevie Ann M.D.   On: 02/20/2022 09:19   CT Head Wo Contrast  Result Date: 02/18/2022 CLINICAL DATA:  Headaches, hypertension EXAM: CT HEAD WITHOUT CONTRAST TECHNIQUE: Contiguous axial images were obtained from the base of the skull through the vertex without intravenous contrast. RADIATION DOSE REDUCTION: This exam was performed according to the departmental dose-optimization program which includes automated exposure control, adjustment of the mA and/or kV according to patient size and/or use of iterative reconstruction technique. COMPARISON:  None Available. FINDINGS: Brain: No acute intracranial findings are seen. There are no signs of bleeding within the cranium. Ventricles are not dilated. There is no focal edema or mass effect. Vascular: Unremarkable. Skull: Unremarkable. Sinuses/Orbits: Unremarkable. Other: Sella is larger than usual in size. Pituitary is difficult to visualize. There is low-density in the enlarged sella, possibly CSF. Suprasellar cistern is unremarkable. IMPRESSION: No acute intracranial findings are seen. There are no signs of bleeding or any focal mass effect. Ventricles are not dilated. Sella is larger than usual with increased amount of CSF suggesting empty sella. If clinically warranted, follow-up MR brain in nonemergent setting may be considered. Electronically Signed   By: Elmer Picker M.D.   On: 02/18/2022 17:49    Procedures Procedures    Medications Ordered in  ED Medications  acetaminophen (TYLENOL) tablet 1,000 mg (1,000 mg Oral Given 02/20/22 1047)  prochlorperazine (COMPAZINE) injection 10 mg (10 mg Intravenous Given 02/20/22 1122)  sodium chloride 0.9 % bolus 1,000 mL (1,000 mLs Intravenous New Bag/Given 02/20/22 1118)  diphenhydrAMINE (BENADRYL) injection 50 mg (50 mg Intravenous Given 02/20/22 1120)    ED Course/ Medical Decision Making/ A&P                           Medical Decision Making Risk Prescription drug management.   LORIENE TAUNTON is a 46 y.o. female with a past medical history significant for previous cholecystectomy, previous hysterectomy, GERD, hypothyroidism, anemia,  anxiety, sleep apnea, and obesity who presents with headache.  Going to patient, she has not had headaches for many years but has had headache for the last 3 days primarily in her right temple area.  She reports it does go towards her right jaw but does not go into the neck or rest of the body.  She denies any nausea, vomiting, or vision changes.  She went to the emergency department several days ago but due to the wait ended up leaving after getting head CT.  The CT showed a partially empty sella and recommended outpatient MRI to further evaluate.  She then had a virtual visit with provider yesterday and was given a prescription for Fioricet but she has not been able to fill it.  She says that the headache is slightly improved but is still having headache so she came in for evaluation to get the MRI.  She otherwise denies any neurologic deficits.  She specifically denies fevers, chills, chest, cough, nausea, vomiting, constipation, diarrhea, or urinary changes.  Denies any numbness, tingling, weakness in extremities.  Denies any difficulty with speech or vision.  Has never had a history of pseudotumor cerebri or intracranial normalities otherwise.  No documentation of clotting disease or previous dural venous sinus thrombus.  She denies history of TMJ troubles but has  had a history of some tension type headaches years ago that related to stress.  On my exam, lungs were clear and chest was nontender.  Abdomen nontender.  No focal neurologic deficits on exam with intact sensation, strength, and pulse in extremities.  Symmetric smile.  Clear speech.  Pupils are symmetric and reactive with normal extract movements.  She was tender in the right temple and right TMJ area.  She reports she did hear a pop at 1 point when she was feeling the pain, unclear if this was her TMJ.  No neck tenderness and she has normal neck movement.  No rigidity.  Exam completely unremarkable otherwise.  No rash seen to suggest shingles.  Clinically I do suspect a tension type headache triggering a migrainous type headache.  She think she may have heard the term migraines in the past.  She does have photophobia and phonophobia.  She was told to get MRI, MRI was ordered in triage and returned without evidence of acute stroke, bleed, mass, or other acute abnormality.  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  As patient is not 3 who has had many years of headaches have low suspicion for intracranial hypertension at this time and given the improving symptoms already, we had a shared decision conversation and agreed to hold on LP at this time.  Low suspicion for meningitis as well.  Given the age have low suspicion for temporal arteritis.  We did have agreed to give a headache cocktail and some screening labs in triage.  Will also check for COVID and flu as we have had several headaches in the community that have been viral related.  If patient is feeling better, anticipate discharge with transfer to pick up her Fioricet as prescribed as well as follow-up with outpatient neurology.  Patient agrees with this plan, anticipate reassessment after medications.  Patient's headache completely resolved and is now 0 out of 10.   We had another shared decision-making conversation and agreed to hold on lumbar puncture or further management.  We will have her follow-up with outpatient neurology and use her outpatient  headache medicines.  She agrees.  Patient other questions or concerns and was discharged in good condition with resolution of symptoms.        Final Clinical Impression(s) / ED Diagnoses Final diagnoses:  Bad headache    Clinical Impression: 1. Bad headache     Disposition: Discharge  Condition: Good  I have discussed the results, Dx and Tx plan with the pt(& family if present). He/she/they expressed understanding and agree(s) with the plan. Discharge instructions discussed at great length. Strict return precautions discussed and pt &/or family have verbalized understanding of the instructions. No further questions at time of discharge.    New Prescriptions   No medications on file    Follow Up: [facility] [place] [facility] [place] [id number] [place], [place], [place] [address] [phone]         Ardyce Heyer, Gwenyth Allegra, MD 02/20/22 1409

## 2022-02-21 ENCOUNTER — Encounter: Payer: Self-pay | Admitting: Nurse Practitioner

## 2022-02-21 ENCOUNTER — Other Ambulatory Visit (HOSPITAL_COMMUNITY): Payer: Self-pay

## 2022-02-22 ENCOUNTER — Telehealth: Payer: Self-pay

## 2022-02-22 ENCOUNTER — Other Ambulatory Visit (HOSPITAL_COMMUNITY): Payer: Self-pay

## 2022-02-22 ENCOUNTER — Other Ambulatory Visit: Payer: Self-pay | Admitting: Nurse Practitioner

## 2022-02-22 ENCOUNTER — Ambulatory Visit (INDEPENDENT_AMBULATORY_CARE_PROVIDER_SITE_OTHER): Payer: No Typology Code available for payment source | Admitting: Family

## 2022-02-22 ENCOUNTER — Encounter: Payer: Self-pay | Admitting: Family

## 2022-02-22 ENCOUNTER — Other Ambulatory Visit: Payer: Self-pay

## 2022-02-22 VITALS — BP 116/83 | HR 93 | Temp 98.3°F | Resp 16 | Ht 64.02 in | Wt 256.0 lb

## 2022-02-22 DIAGNOSIS — Z0130 Encounter for examination of blood pressure without abnormal findings: Secondary | ICD-10-CM

## 2022-02-22 DIAGNOSIS — Z0289 Encounter for other administrative examinations: Secondary | ICD-10-CM

## 2022-02-22 DIAGNOSIS — E236 Other disorders of pituitary gland: Secondary | ICD-10-CM | POA: Diagnosis not present

## 2022-02-22 DIAGNOSIS — G44209 Tension-type headache, unspecified, not intractable: Secondary | ICD-10-CM

## 2022-02-22 DIAGNOSIS — F32A Depression, unspecified: Secondary | ICD-10-CM

## 2022-02-22 DIAGNOSIS — F419 Anxiety disorder, unspecified: Secondary | ICD-10-CM

## 2022-02-22 DIAGNOSIS — R519 Headache, unspecified: Secondary | ICD-10-CM

## 2022-02-22 DIAGNOSIS — Z6841 Body Mass Index (BMI) 40.0 and over, adult: Secondary | ICD-10-CM

## 2022-02-22 DIAGNOSIS — E6609 Other obesity due to excess calories: Secondary | ICD-10-CM

## 2022-02-22 MED ORDER — BUTALBITAL-APAP-CAFFEINE 50-300-40 MG PO CAPS
1.0000 | ORAL_CAPSULE | Freq: Four times a day (QID) | ORAL | 0 refills | Status: DC | PRN
  Filled 2022-02-22: qty 40, 10d supply, fill #0

## 2022-02-22 MED ORDER — HYDROCODONE-ACETAMINOPHEN 10-325 MG PO TABS
1.0000 | ORAL_TABLET | Freq: Three times a day (TID) | ORAL | 0 refills | Status: DC | PRN
  Filled 2022-02-22: qty 60, 20d supply, fill #0

## 2022-02-22 NOTE — Telephone Encounter (Signed)
Appt scheduled

## 2022-02-22 NOTE — Progress Notes (Signed)
Pt presents for follow-up -ED visit 12/29 MRI of brain completed  -FMLA paperwork to put in place in case she needs to take off work again during an mental breakdown

## 2022-02-22 NOTE — Progress Notes (Signed)
Patient ID: Heather Campos, female    DOB: 04-07-1975  MRN: [id number]  CC: Emergency Department Follow-Up  Subjective: Heather Campos is a 47 y.o. female who presents for emergency department follow-up.   Her concerns today include:  02/20/2022 Heather Campos Emergency Department per MD note: Medical Decision Making Risk Prescription drug management.   Heather Campos is a 47 y.o. female with a past medical history significant for previous cholecystectomy, previous hysterectomy, GERD, hypothyroidism, anemia, anxiety, sleep apnea, and obesity who presents with headache.  Going to patient, she has not had headaches for many years but has had headache for the last 3 days primarily in her right temple area.  She reports it does go towards her right jaw but does not go into the neck or rest of the body.  She denies any nausea, vomiting, or vision changes.  She went to the emergency department several days ago but due to the wait ended up leaving after getting head CT.  The CT showed a partially empty sella and recommended outpatient MRI to further evaluate.  She then had a virtual visit with provider yesterday and was given a prescription for Fioricet but she has not been able to fill it.  She says that the headache is slightly improved but is still having headache so she came in for evaluation to get the MRI.  She otherwise denies any neurologic deficits.  She specifically denies fevers, chills, chest, cough, nausea, vomiting, constipation, diarrhea, or urinary changes.  Denies any numbness, tingling, weakness in extremities.  Denies any difficulty with speech or vision.  Has never had a history of pseudotumor cerebri or intracranial normalities otherwise.  No documentation of clotting disease or previous dural venous sinus thrombus.  She denies history of TMJ troubles but has had a history of some tension type headaches years ago that related to stress.   On my exam, lungs were clear and chest  was nontender.  Abdomen nontender.  No focal neurologic deficits on exam with intact sensation, strength, and pulse in extremities.  Symmetric smile.  Clear speech.  Pupils are symmetric and reactive with normal extract movements.  She was tender in the right temple and right TMJ area.  She reports she did hear a pop at 1 point when she was feeling the pain, unclear if this was her TMJ.  No neck tenderness and she has normal neck movement.  No rigidity.  Exam completely unremarkable otherwise.  No rash seen to suggest shingles.   Clinically I do suspect a tension type headache triggering a migrainous type headache.  She think she may have heard the term migraines in the past.  She does have photophobia and phonophobia.   She was told to get MRI, MRI was ordered in triage and returned without evidence of acute stroke, bleed, mass, or other acute abnormality.  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   As patient is not 3 who has had many years of headaches have low suspicion for intracranial hypertension at this time and given the improving symptoms already, we had a shared decision conversation and agreed to hold on LP at this time.  Low suspicion for meningitis as well.  Given the age have low suspicion for temporal arteritis.  We did have agreed to give a headache cocktail and some screening labs in triage.  Will also check for COVID and flu as we have had several  headaches in the community that have been viral related.   If patient is feeling better, anticipate discharge with transfer to pick up her Fioricet as prescribed as well as follow-up with outpatient neurology.  Patient agrees with this plan, anticipate reassessment after medications.   Patient's headache completely resolved and is now 0 out of 10.  We had another shared decision-making conversation and agreed to hold on lumbar puncture or further management.  We  will have her follow-up with outpatient neurology and use her outpatient headache medicines.  She agrees.   Patient other questions or concerns and was discharged in good condition with resolution of symptoms.  Today's visit 02/22/2022: - Since emergency department visit feeling improved. Denies red flag symptoms. Has not began Fioricet as of yet due to needs to be resent to [facility]. Upon review the same was resent to patient's requested pharmacy on today by Malachi Pro, NP.  - Today requests FMLA paperwork completion for anxiety depression. She is an Pharmacologist, job title: Dealer. Reports they have to "bid their shifts" so work shifts/times varies.   Patient Active Problem List   Diagnosis Date Noted   Anxiety and depression 12/24/2021   Cerumen debris on tympanic membrane of left ear 09/30/2021   Hypothyroidism 08/20/2021   Chronic pain 08/17/2021   Lumbar spondylosis 06/29/2021   Bilateral carpal tunnel syndrome 06/23/2021   Pain in joint of left knee 02/04/2021   COVID-19 11/27/2020   Class 3 severe obesity due to excess calories without serious comorbidity with body mass index (BMI) of 40.0 to 44.9 in adult ([place]) 11/27/2020   Pain in joint of right shoulder 10/06/2020   Calcific tendinitis of right shoulder 10/06/2020   Low back pain 08/13/2020   Insomnia 04/14/2020   Pain of toe of right foot 02/24/2020   Stiffness of left shoulder joint 11/14/2019   Adhesive capsulitis of left shoulder 10/02/2019   Shoulder pain, left 09/03/2019   Calcific tendinitis of left shoulder 09/03/2019   Fibroid uterus 07/04/2019   Uterine fibroid 07/04/2019   Tachycardia 05/14/2019   History of COVID-19 05/13/2019   Hair loss 05/13/2019   OSA (obstructive sleep apnea) 02/09/2016   Family history of systemic lupus erythematosus 02/09/2016   Vitamin D deficiency 09/02/2015   Morbid obesity due to excess calories ([place]) 09/02/2015   Iron deficiency anemia due  to chronic blood loss 09/02/2015   Hypothyroidism due to Hashimoto's thyroiditis 09/02/2015     Current Outpatient Medications on File Prior to Visit  Medication Sig Dispense Refill   Azelaic Acid 15 % gel 1 application Externally Twice a day for 30 days     clindamycin (CLEOCIN T) 1 % lotion Apply 1 application topically daily.      diazepam (VALIUM) 5 MG tablet Take 1 tablet (5 mg total) by mouth every 12 (twelve) hours as needed for up to 2 doses for anxiety. 2 tablet 0   escitalopram (LEXAPRO) 10 MG tablet Take 1 tablet (10 mg total) by mouth daily. 30 tablet 0   gabapentin (NEURONTIN) 300 MG capsule Take 300 mg by mouth daily.     gabapentin (NEURONTIN) 300 MG capsule Take 1 capsule by mouth once daily at bedtime 30 capsule 3   gabapentin (NEURONTIN) 300 MG capsule Take 1 capsule (300 mg total) by mouth at bedtime. 30 capsule 3   HYDROcodone-acetaminophen (NORCO) 10-325 MG tablet Take 1 tablet by mouth every 8 (eight) hours as needed. 60 tablet 0   hydrOXYzine (  VISTARIL) 25 MG capsule Take 1 capsule (25 mg total) by mouth every 8 (eight) hours as needed. 30 capsule 0   levothyroxine (SYNTHROID) 137 MCG tablet Take 1 tablet (137 mcg total) by mouth daily. 30 tablet 2   meloxicam (MOBIC) 15 MG tablet Take 15 mg by mouth daily.     meloxicam (MOBIC) 15 MG tablet Take 1 tablet by mouth every day as needed. 30 tablet 3   meloxicam (MOBIC) 15 MG tablet Take 1 tablet (15 mg total) by mouth daily as needed. 30 tablet 3   metFORMIN (GLUCOPHAGE) 500 MG tablet Take 500 mg by mouth 3 (three) times daily.     omeprazole (PRILOSEC) 40 MG capsule Take 1 capsule (40 mg total) by mouth daily as needed. 90 capsule 0   oxyCODONE-acetaminophen (PERCOCET/ROXICET) 5-325 MG tablet Take 1 tablet by mouth every 8 (eight) hours as needed.     phentermine 15 MG capsule Take 1 capsule (15 mg total) by mouth every morning. 30 capsule 0   Vitamin D, Ergocalciferol, (DRISDOL) 1.25 MG (50000 UNIT) CAPS capsule Take 1  capsule (50,000 Units total) by mouth every 7 (seven) days. 4 capsule 2   No current facility-administered medications on file prior to visit.    Allergies  Allergen Reactions   No Known Allergies     Social History   Socioeconomic History   Marital status: Married    Spouse name: Not on file   Number of children: Not on file   Years of education: Not on file   Highest education level: Not on file  Occupational History   Not on file  Tobacco Use   Smoking status: Former    Passive exposure: Past   Smokeless tobacco: Never  Vaping Use   Vaping Use: Never used  Substance and Sexual Activity   Alcohol use: No   Drug use: No   Sexual activity: Yes    Birth control/protection: None  Other Topics Concern   Not on file  Social History Narrative   Not on file   Social Determinants of Health   Financial Resource Strain: Not on file  Food Insecurity: Not on file  Transportation Needs: Not on file  Physical Activity: Not on file  Stress: Not on file  Social Connections: Not on file  Intimate Partner Violence: Not on file    Family History  Problem Relation Age of Onset   Lupus Mother    Kidney disease Mother    Lupus Cousin     Past Surgical History:  Procedure Laterality Date   ABDOMINAL HYSTERECTOMY     CESAREAN SECTION     CHOLECYSTECTOMY     CYSTOSCOPY  07/04/2019   Procedure: CYSTOSCOPY;  Surgeon: Sanjuana Kava, MD;  Location: [place];  Service: Gynecology;;   HYSTERECTOMY ABDOMINAL WITH SALPINGECTOMY Bilateral 07/04/2019   Procedure: ABDOMINAL  suprcervical hysterctomyWITH SALPINGECTOMY ;  Surgeon: Sanjuana Kava, MD;  Location: [facility];  Service: Gynecology;  Laterality: Bilateral;   TOTAL LAPAROSCOPIC HYSTERECTOMY WITH SALPINGECTOMY Bilateral 07/04/2019   Procedure: attemted TOTAL LAPAROSCOPIC HYSTERECTOMY WITH SALPINGECTOMY;  Surgeon: Sanjuana Kava, MD;  Location: [facility];  Service: Gynecology;  Laterality:  Bilateral;  3 hours    ROS: Review of Systems Negative except as stated above  PHYSICAL EXAM: BP 116/83 (BP Location: Left Arm, Patient Position: Sitting, Cuff Size: Large)   Pulse 93   Temp 98.3 F (36.8 C)   Resp 16   Ht 5' 4.02" (1.626 m)  Wt 256 lb (116.1 kg)   LMP 06/24/2019   SpO2 96%   BMI 43.92 kg/m   Physical Exam HENT:     Head: Normocephalic and atraumatic.  Eyes:     Extraocular Movements: Extraocular movements intact.     Conjunctiva/sclera: Conjunctivae normal.     Pupils: Pupils are equal, round, and reactive to light.  Cardiovascular:     Rate and Rhythm: Normal rate and regular rhythm.     Pulses: Normal pulses.     Heart sounds: Normal heart sounds.  Pulmonary:     Effort: Pulmonary effort is normal.     Breath sounds: Normal breath sounds.  Musculoskeletal:     Cervical back: Normal range of motion and neck supple.  Neurological:     General: No focal deficit present.     Mental Status: She is alert and oriented to person, place, and time.  Psychiatric:        Mood and Affect: Mood normal.        Behavior: Behavior normal.    ASSESSMENT AND PLAN: 1. Empty sella ([place]) 2. Nonintractable headache, unspecified chronicity pattern, unspecified headache type - Continue Fioricet as prescribed. No refills needed as of present. - Referral to Neurology for further evaluation/management.  - Ambulatory referral to Neurology  3. Encounter for completion of form with patient 4. Anxiety and depression - Employee's Serious Health Condition FMLA paperwork completed today in office.   5. Blood pressure check - Blood pressure improved compared to previous and normal on today.  - Follow-up with primary provider as scheduled.    Patient was given the opportunity to ask questions.  Patient verbalized understanding of the plan and was able to repeat key elements of the plan. Patient was given clear instructions to go to Emergency Department or return to medical  center if symptoms don't improve, worsen, or new problems develop.The patient verbalized understanding.   Orders Placed This Encounter  Procedures   Ambulatory referral to Neurology    Follow-up with primary provider as scheduled.   Camillia Herter, NP

## 2022-02-22 NOTE — Telephone Encounter (Signed)
PRIOR AUTH FOR BUTALBITAL-APAP-CAFF SUBMITTED TO [employer] TODAY VIA [employer] Key: [id number]

## 2022-02-22 NOTE — Patient Instructions (Signed)
Tension Headache, Adult A tension headache is a feeling of pain, pressure, or aching over the front and sides of the head. The pain can be dull, or it can feel tight. There are two types of tension headache: Episodic tension headache. This is when the headaches happen fewer than 15 days a month. Chronic tension headache. This is when the headaches happen more than 15 days a month during a 76-monthperiod. A tension headache can last from 30 minutes to several days. It is the most common kind of headache. Tension headaches are not normally associated with nausea or vomiting, and they do not get worse with physical activity. What are the causes? The exact cause of this condition is not known. Tension headaches are often triggered by stress, anxiety, or depression. Other triggers may include: Alcohol. Too much caffeine or caffeine withdrawal. Respiratory infections, such as colds, flu, or sinus infections. Dental problems or teeth clenching. Fatigue. Holding your head and neck in the same position for a long period of time, such as while using a computer. Smoking. Arthritis of the neck. What are the signs or symptoms? Symptoms of this condition include: A feeling of pressure or tightness around the head. Dull, aching head pain. Pain over the front and sides of the head. Tenderness in the muscles of the head, neck, and shoulders. How is this diagnosed? This condition may be diagnosed based on your symptoms, your medical history, and a physical exam. If your symptoms are severe or unusual, you may have imaging tests, such as a CT scan or an MRI of your head. Your vision may also be checked. How is this treated? This condition may be treated with lifestyle changes and with medicines that help relieve symptoms. Follow these instructions at home: Managing pain Take over-the-counter and prescription medicines only as told by your health care provider. When you have a headache, lie down in a dark,  quiet room. If directed, put ice on your head and neck. To do this: Put ice in a plastic bag. Place a towel between your skin and the bag. Leave the ice on for 20 minutes, 2-3 times a day. Remove the ice if your skin turns bright red. This is very important. If you cannot feel pain, heat, or cold, you have a greater risk of damage to the area. If directed, apply heat to the back of your neck as often as told by your health care provider. Use the heat source that your health care provider recommends, such as a moist heat pack or a heating pad. Place a towel between your skin and the heat source. Leave the heat on for 20-30 minutes. Remove the heat if your skin turns bright red. This is especially important if you are unable to feel pain, heat, or cold. You have a greater risk of getting burned. Eating and drinking Eat meals on a regular schedule. If you drink alcohol: Limit how much you have to: 0-1 drink a day for women who are not pregnant. 0-2 drinks a day for men. Know how much alcohol is in your drink. In the [place] one drink equals one 12 oz bottle of beer (355 mL), one 5 oz glass of wine (148 mL), or one 1 oz glass of hard liquor (44 mL). Drink enough fluid to keep your urine pale yellow. Decrease your caffeine intake, or stop using caffeine. Lifestyle Get 7-9 hours of sleep each night, or get the amount of sleep recommended by your health care provider. At bedtime,  remove computers, phones, and tablets from your room. Find ways to manage your stress. This may include: Exercise. Deep breathing exercises. Yoga. Listening to music. Positive mental imagery. Try to sit up straight and avoid tensing your muscles. Do not use any products that contain nicotine or tobacco. These include cigarettes, chewing tobacco, and vaping devices, such as e-cigarettes. If you need help quitting, ask your health care provider. General instructions  Avoid any headache triggers. Keep a journal to help  find out what may trigger your headaches. For example, write down: What you eat and drink. How much sleep you get. Any change to your diet or medicines. Keep all follow-up visits. This is important. Contact a health care provider if: Your headache does not get better. Your headache comes back. You are sensitive to sounds, light, or smells because of a headache. You have nausea or you vomit. Your stomach hurts. Get help right away if: You suddenly develop a severe headache, along with any of the following: A stiff neck. Nausea and vomiting. Confusion. Weakness in one part or one side of your body. Double vision or loss of vision. Shortness of breath. Rash. Unusual sleepiness. Fever or chills. Trouble speaking. Pain in your eye or ear. Trouble walking or balancing. Feeling faint or passing out. Summary A tension headache is a feeling of pain, pressure, or aching over the front and sides of the head. A tension headache can last from 30 minutes to several days. It is the most common kind of headache. This condition may be diagnosed based on your symptoms, your medical history, and a physical exam. This condition may be treated with lifestyle changes and with medicines that help relieve symptoms. This information is not intended to replace advice given to you by your health care provider. Make sure you discuss any questions you have with your health care provider. Document Revised: 11/07/2019 Document Reviewed: 11/07/2019 Elsevier Patient Education  [employer].

## 2022-02-23 ENCOUNTER — Other Ambulatory Visit (HOSPITAL_COMMUNITY): Payer: Self-pay

## 2022-02-25 ENCOUNTER — Telehealth: Payer: Self-pay | Admitting: Internal Medicine

## 2022-02-25 ENCOUNTER — Telehealth: Payer: Self-pay | Admitting: Family

## 2022-02-25 ENCOUNTER — Encounter: Payer: Self-pay | Admitting: Family

## 2022-02-25 NOTE — Telephone Encounter (Signed)
PT had a sleep study done and the CPAP Provider stated she needs a new RX because the one we submitted expired. Please call her to advise: [phone]  [employer] she thinks @ [phone]

## 2022-02-25 NOTE — Telephone Encounter (Signed)
This will need to be obtained from Baird Lyons, MD at Sleep Medicine.

## 2022-02-25 NOTE — Telephone Encounter (Signed)
Butalbital-APAP-Caffeine has been denied.

## 2022-02-28 NOTE — Progress Notes (Unsigned)
03/01/22- 28 yoF former smoker for sleep evaluation with concern of OSA Medical problem list includes OSA, Allergic Rhinitis, GERD, Hypothyroid, Morbid Obesity, Anxiety/ Depression,  NPSG 09/17/18-[facility]- AHI 2.4/ hr, desaturation to 75%, body weight 252 lbs HST 04/07/20-[facility] 22.8/ hr, desaturation to 77%, body weight  Epworth score-13 Body weight today-263 lbs Covid vax- Flu vax-- Hx of OSA. Slept much better with CPAP until machine lost in a move. Wants to re-start. Husband c/o her snore. Tired in day. Sleep feels fragmented, nonrestorative. No ENT surgery, heart or lung problems.  Prior to Admission medications   Medication Sig Start Date End Date Taking? Authorizing Provider  Azelaic Acid 15 % gel 1 application Externally Twice a day for 30 days 12/22/21 03/21/22 Yes [provider]  Butalbital-APAP-Caffeine 50-300-40 MG CAPS Take 1 tablet by mouth every 6 (six) hours as needed. 02/22/22  Yes Gildardo Pounds, NP  clindamycin (CLEOCIN T) 1 % lotion Apply 1 application topically daily.  06/03/19  Yes [provider]  doxycycline (VIBRA-TABS) 100 MG tablet Take 100 mg by mouth daily. 02/28/22  Yes [provider]  escitalopram (LEXAPRO) 10 MG tablet Take 1 tablet (10 mg total) by mouth daily. 02/18/22 03/24/22 Yes Minette Brine, Amy J, NP  gabapentin (NEURONTIN) 300 MG capsule Take 1 capsule (300 mg total) by mouth at bedtime. 01/26/22  Yes Pedro Earls, MD  HYDROcodone-acetaminophen (NORCO) 10-325 MG tablet Take 1 tablet by mouth every 8 (eight) hours as needed. 02/21/22  Yes   levothyroxine (SYNTHROID) 137 MCG tablet Take 1 tablet (137 mcg total) by mouth daily. 02/19/22 05/20/22 Yes Minette Brine, Amy J, NP  meloxicam (MOBIC) 15 MG tablet Take 1 tablet (15 mg total) by mouth daily as needed. 01/26/22  Yes   metFORMIN (GLUCOPHAGE) 500 MG tablet Take 500 mg by mouth 3 (three) times daily. 12/08/21  Yes [provider]  phentermine 15 MG capsule Take 1 capsule (15 mg total)  by mouth every morning. 02/18/22 03/24/22 Yes Minette Brine, Amy J, NP  Vitamin D, Ergocalciferol, (DRISDOL) 1.25 MG (50000 UNIT) CAPS capsule Take 1 capsule (50,000 Units total) by mouth every 7 (seven) days. 03/08/21  Yes Minette Brine, Amy J, NP  diazepam (VALIUM) 5 MG tablet Take 1 tablet (5 mg total) by mouth every 12 (twelve) hours as needed for up to 2 doses for anxiety. Patient not taking: Reported on 03/01/2022 11/22/21   Camillia Herter, NP  gabapentin (NEURONTIN) 300 MG capsule Take 300 mg by mouth daily. 12/13/19   [provider]  gabapentin (NEURONTIN) 300 MG capsule Take 1 capsule by mouth once daily at bedtime 01/04/22     hydrOXYzine (VISTARIL) 25 MG capsule Take 1 capsule (25 mg total) by mouth every 8 (eight) hours as needed. 12/24/21   Camillia Herter, NP  meloxicam (MOBIC) 15 MG tablet Take 15 mg by mouth daily. 12/05/21   [provider]  meloxicam (MOBIC) 15 MG tablet Take 1 tablet by mouth every day as needed. 01/04/22     omeprazole (PRILOSEC) 40 MG capsule Take 1 capsule (40 mg total) by mouth daily as needed. 11/17/21 02/15/22  Camillia Herter, NP  oxyCODONE-acetaminophen (PERCOCET/ROXICET) 5-325 MG tablet Take 1 tablet by mouth every 8 (eight) hours as needed. 12/21/21   [provider]   Past Medical History:  Diagnosis Date   Allergic rhinitis    Anxiety    Chronic back pain    Family history of systemic lupus erythematosus    GERD (gastroesophageal reflux disease)  Hypothyroidism due to Hashimoto's thyroiditis    IDA (iron deficiency anemia)    Morbid obesity (HCC)    OSA (obstructive sleep apnea)    No CPAP   Vitamin D deficiency    Past Surgical History:  Procedure Laterality Date   ABDOMINAL HYSTERECTOMY     CESAREAN SECTION     CHOLECYSTECTOMY     CYSTOSCOPY  07/04/2019   Procedure: CYSTOSCOPY;  Surgeon: Sanjuana Kava, MD;  Location: [place];  Service: Gynecology;;   HYSTERECTOMY ABDOMINAL WITH SALPINGECTOMY Bilateral  07/04/2019   Procedure: ABDOMINAL  suprcervical hysterctomyWITH SALPINGECTOMY ;  Surgeon: Sanjuana Kava, MD;  Location: [place];  Service: Gynecology;  Laterality: Bilateral;   TOTAL LAPAROSCOPIC HYSTERECTOMY WITH SALPINGECTOMY Bilateral 07/04/2019   Procedure: attemted TOTAL LAPAROSCOPIC HYSTERECTOMY WITH SALPINGECTOMY;  Surgeon: Sanjuana Kava, MD;  Location: [place];  Service: Gynecology;  Laterality: Bilateral;  3 hours   Family History  Problem Relation Age of Onset   Lupus Mother    Kidney disease Mother    Lupus Cousin    Social History   Socioeconomic History   Marital status: Married    Spouse name: Not on file   Number of children: Not on file   Years of education: Not on file   Highest education level: Not on file  Occupational History   Not on file  Tobacco Use   Smoking status: Former    Passive exposure: Past   Smokeless tobacco: Never  Vaping Use   Vaping Use: Never used  Substance and Sexual Activity   Alcohol use: No   Drug use: No   Sexual activity: Yes    Birth control/protection: None  Other Topics Concern   Not on file  Social History Narrative   Not on file   Social Determinants of Health   Financial Resource Strain: Not on file  Food Insecurity: Not on file  Transportation Needs: Not on file  Physical Activity: Not on file  Stress: Not on file  Social Connections: Not on file  Intimate Partner Violence: Not on file   ROS-see HPI   + = positive Constitutional:    weight loss, night sweats, fevers, chills, fatigue, lassitude. HEENT:    +headaches, difficulty swallowing, tooth/dental problems, sore throat,       sneezing, itching, ear ache, nasal congestion, post nasal drip, snoring CV:    chest pain, orthopnea, PND, swelling in lower extremities, anasarca,                                   dizziness, palpitations Resp:   shortness of breath with exertion or at rest.                productive cough,    non-productive cough, coughing up of blood.              change in color of mucus.  wheezing.   Skin:    rash or lesions. GI:  No-   heartburn, indigestion, abdominal pain, nausea, vomiting, diarrhea,                 change in bowel habits, loss of appetite GU: dysuria, change in color of urine, no urgency or frequency.   flank pain. MS:   joint pain, stiffness, decreased range of motion, back pain. Neuro-     nothing unusual Psych:  change in mood or affect.  depression  or +anxiety.   memory loss.  OBJ- Physical Exam    + hijab General- Alert, Oriented, Affect-appropriate, Distress- none acute, +obese,  Skin- rash-none, lesions- none, excoriation- none Lymphadenopathy- none Head- atraumatic            Eyes- Gross vision intact, PERRLA, conjunctivae and secretions clear            Ears- Hearing, canals-normal            Nose- Clear, no-Septal dev, mucus, polyps, erosion, perforation             Throat- Mallampati IV , mucosa clear , drainage- none, tonsils- atrophic Neck- flexible , trachea midline, no stridor , thyroid nl, carotid no bruit Chest - symmetrical excursion , unlabored           Heart/CV- RRR , no murmur , no gallop  , no rub, nl s1 s2                           - JVD- none , edema- none, stasis changes- none, varices- none           Lung- clear to P&A, wheeze- none, cough- none , dullness-none, rub- none           Chest wall-  Abd-  Br/ Gen/ Rectal- Not done, not indicated Extrem- cyanosis- none, clubbing, none, atrophy- none, strength- nl Neuro- grossly intact to observation

## 2022-02-28 NOTE — Telephone Encounter (Signed)
Spoke with patient who states PCP advised her to contact Dr. Annamaria Boots in regards to place a cpap order, pt has never been seen here.   Do you mind getting her scheduled with Dr. Annamaria Boots for a new patient appointment?

## 2022-02-28 NOTE — Telephone Encounter (Signed)
Pt is scheduled to see CY 1/9. Nothing further needed.

## 2022-03-01 ENCOUNTER — Ambulatory Visit (INDEPENDENT_AMBULATORY_CARE_PROVIDER_SITE_OTHER): Payer: No Typology Code available for payment source | Admitting: Internal Medicine

## 2022-03-01 ENCOUNTER — Encounter: Payer: Self-pay | Admitting: Internal Medicine

## 2022-03-01 VITALS — BP 122/82 | HR 81 | Ht 63.0 in | Wt 263.8 lb

## 2022-03-01 DIAGNOSIS — G4733 Obstructive sleep apnea (adult) (pediatric): Secondary | ICD-10-CM | POA: Diagnosis not present

## 2022-03-01 NOTE — Assessment & Plan Note (Signed)
Encourage ongoing effort- diet, exercise

## 2022-03-01 NOTE — Patient Instructions (Signed)
[facility] 5-15, mask of choice, humidifier, supplies, AirView/ card  Please call if we can help

## 2022-03-01 NOTE — Assessment & Plan Note (Signed)
Appropriate to restart CPAP- plan auto 5-15

## 2022-03-02 ENCOUNTER — Other Ambulatory Visit: Payer: Self-pay

## 2022-03-02 ENCOUNTER — Encounter (INDEPENDENT_AMBULATORY_CARE_PROVIDER_SITE_OTHER): Payer: BLUE CROSS/BLUE SHIELD | Admitting: Internal Medicine

## 2022-03-02 ENCOUNTER — Other Ambulatory Visit (HOSPITAL_COMMUNITY): Payer: Self-pay

## 2022-03-03 ENCOUNTER — Other Ambulatory Visit (HOSPITAL_COMMUNITY): Payer: Self-pay

## 2022-03-04 ENCOUNTER — Other Ambulatory Visit (HOSPITAL_COMMUNITY): Payer: Self-pay

## 2022-03-07 ENCOUNTER — Other Ambulatory Visit: Payer: Self-pay | Admitting: Nurse Practitioner

## 2022-03-07 ENCOUNTER — Other Ambulatory Visit: Payer: Self-pay

## 2022-03-07 MED ORDER — NAPROXEN 500 MG PO TABS: 500.0000 mg | ORAL_TABLET | Freq: Two times a day (BID) | ORAL | 0 refills | Status: DC

## 2022-03-08 ENCOUNTER — Ambulatory Visit: Payer: No Typology Code available for payment source | Admitting: Family

## 2022-03-08 ENCOUNTER — Other Ambulatory Visit (HOSPITAL_COMMUNITY): Payer: Self-pay

## 2022-03-08 MED ORDER — DOXYCYCLINE HYCLATE 100 MG PO TABS
100.0000 mg | ORAL_TABLET | Freq: Every day | ORAL | 0 refills | Status: DC
  Filled 2022-03-08 – 2022-03-15 (×3): qty 30, 30d supply, fill #0

## 2022-03-10 ENCOUNTER — Other Ambulatory Visit (HOSPITAL_COMMUNITY): Payer: Self-pay

## 2022-03-15 ENCOUNTER — Encounter (INDEPENDENT_AMBULATORY_CARE_PROVIDER_SITE_OTHER): Payer: Self-pay | Admitting: Family Medicine

## 2022-03-15 ENCOUNTER — Other Ambulatory Visit (HOSPITAL_COMMUNITY): Payer: Self-pay

## 2022-03-15 ENCOUNTER — Encounter (INDEPENDENT_AMBULATORY_CARE_PROVIDER_SITE_OTHER): Payer: Self-pay

## 2022-03-15 ENCOUNTER — Ambulatory Visit (INDEPENDENT_AMBULATORY_CARE_PROVIDER_SITE_OTHER): Payer: Self-pay | Admitting: Family Medicine

## 2022-03-15 VITALS — Ht 63.0 in

## 2022-03-15 DIAGNOSIS — Z91199 Patient's noncompliance with other medical treatment and regimen due to unspecified reason: Secondary | ICD-10-CM

## 2022-03-28 ENCOUNTER — Other Ambulatory Visit (HOSPITAL_COMMUNITY): Payer: Self-pay

## 2022-03-28 MED ORDER — HYDROCODONE-ACETAMINOPHEN 10-325 MG PO TABS
1.0000 | ORAL_TABLET | Freq: Three times a day (TID) | ORAL | 0 refills | Status: DC | PRN
  Filled 2022-03-28: qty 60, 20d supply, fill #0

## 2022-03-29 ENCOUNTER — Other Ambulatory Visit (HOSPITAL_COMMUNITY): Payer: Self-pay

## 2022-03-29 MED ORDER — DOXYCYCLINE HYCLATE 100 MG PO TABS
100.0000 mg | ORAL_TABLET | Freq: Every day | ORAL | 2 refills | Status: DC
  Filled 2022-03-29 – 2022-05-05 (×3): qty 30, 30d supply, fill #0

## 2022-04-12 ENCOUNTER — Ambulatory Visit (HOSPITAL_COMMUNITY): Payer: No Typology Code available for payment source | Admitting: Psychiatry

## 2022-04-16 ENCOUNTER — Other Ambulatory Visit (HOSPITAL_COMMUNITY): Payer: Self-pay

## 2022-04-25 ENCOUNTER — Other Ambulatory Visit (HOSPITAL_COMMUNITY): Payer: Self-pay

## 2022-04-25 NOTE — Progress Notes (Signed)
Not seen

## 2022-05-05 ENCOUNTER — Other Ambulatory Visit (HOSPITAL_COMMUNITY): Payer: Self-pay

## 2022-05-06 ENCOUNTER — Other Ambulatory Visit (HOSPITAL_COMMUNITY): Payer: Self-pay

## 2022-05-06 ENCOUNTER — Other Ambulatory Visit: Payer: Self-pay

## 2022-05-06 MED ORDER — HYDROCODONE-ACETAMINOPHEN 10-325 MG PO TABS
1.0000 | ORAL_TABLET | Freq: Three times a day (TID) | ORAL | 0 refills | Status: DC | PRN
  Filled 2022-05-06: qty 60, 20d supply, fill #0

## 2022-05-09 DIAGNOSIS — G8929 Other chronic pain: Secondary | ICD-10-CM | POA: Insufficient documentation

## 2022-05-09 DIAGNOSIS — D472 Monoclonal gammopathy: Secondary | ICD-10-CM | POA: Insufficient documentation

## 2022-05-09 DIAGNOSIS — Z87891 Personal history of nicotine dependence: Secondary | ICD-10-CM | POA: Diagnosis not present

## 2022-05-09 DIAGNOSIS — Z90710 Acquired absence of both cervix and uterus: Secondary | ICD-10-CM | POA: Insufficient documentation

## 2022-05-09 NOTE — Progress Notes (Unsigned)
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  2) 01/13/2020: establish care with Dr. Lorenso Courier. Kappa 18.3, Lambda 15.8, Ratio 1.16. M protein 0.6 3) 04/15/2020: M protein 0.5, Lambda 18.7, Kappa 21.8, ratio 1.17 4) 10/16/2020: WBC 10.3, Hgb 12.8, MCV 82.5, Plt 285. M protein 0.7, kappa 25.7, Lambda 18.9, ratio 1.36  Interval History:  Heather Campos 47 y.o. female with medical history significant for IgG Lambda MGUS who presents for a follow up visit. The patient's last visit was on 04/28/2021. In the interim since the last visit she has had no major changes in her health.   On exam today Heather Campos is accompanied by her husband.  She reports that she has been well overall interim since her visit 1 year ago.  She has had no major changes in her health.  She started no new medications and had no new diagnoses.  She reports that she does have some chronic back pain from working at her desk.  Her husband has been telling her to do some stretches in order to help with this.  She reports her energy levels are "so-so".  She currently ranks them at about a 7 out of 10.  She has been eating well.  She has had no dark urine or bubbling in her urine.  She reports that for the most part her urine has been quite clear.  Otherwise her health has been steady.  She otherwise denies any fevers, chills, sweats, nausea, vomiting or diarrhea.  A full 10 point ROS is listed below.  MEDICAL HISTORY:  Past Medical History:  Diagnosis Date   Allergic rhinitis    Anxiety    Chronic back pain    Family history of systemic lupus erythematosus    GERD (gastroesophageal reflux disease)    Hypothyroidism due to Hashimoto's  thyroiditis    IDA (iron deficiency anemia)    Morbid obesity (HCC)    OSA (obstructive sleep apnea)    No CPAP   Vitamin D deficiency     SURGICAL HISTORY: Past Surgical History:  Procedure Laterality Date   ABDOMINAL HYSTERECTOMY     CESAREAN SECTION     CHOLECYSTECTOMY     CYSTOSCOPY  07/04/2019   Procedure: CYSTOSCOPY;  Surgeon: Sanjuana Kava, MD;  Location: [facility];  Service: Gynecology;;   HYSTERECTOMY ABDOMINAL WITH SALPINGECTOMY Bilateral 07/04/2019   Procedure: ABDOMINAL  suprcervical hysterctomyWITH SALPINGECTOMY ;  Surgeon: Sanjuana Kava, MD;  Location: [facility];  Service: Gynecology;  Laterality: Bilateral;   TOTAL LAPAROSCOPIC HYSTERECTOMY WITH SALPINGECTOMY Bilateral 07/04/2019   Procedure: attemted TOTAL LAPAROSCOPIC HYSTERECTOMY WITH SALPINGECTOMY;  Surgeon: Sanjuana Kava, MD;  Location: [facility];  Service: Gynecology;  Laterality: Bilateral;  3 hours    SOCIAL HISTORY: Social History   Socioeconomic History   Marital status: Married    Spouse name: Not on file   Number of children: Not on file   Years of education: Not on file   Highest education level: Not on file  Occupational History   Not on file  Tobacco Use   Smoking status: Former    Passive exposure: Past   Smokeless tobacco: Never  Vaping Use   Vaping Use: Never used  Substance and Sexual Activity  Alcohol use: No   Drug use: No   Sexual activity: Yes    Birth control/protection: None  Other Topics Concern   Not on file  Social History Narrative   Not on file   Social Determinants of Health   Financial Resource Strain: Not on file  Food Insecurity: Not on file  Transportation Needs: Not on file  Physical Activity: Not on file  Stress: Not on file  Social Connections: Not on file  Intimate Partner Violence: Not on file    FAMILY HISTORY: Family History  Problem Relation Age of Onset   Lupus Mother    Kidney disease Mother    Lupus  Cousin     ALLERGIES:  is allergic to no known allergies.  MEDICATIONS:  Current Outpatient Medications  Medication Sig Dispense Refill   Butalbital-APAP-Caffeine 50-300-40 MG CAPS Take 1 tablet by mouth every 6 (six) hours as needed. 40 capsule 0   clindamycin (CLEOCIN T) 1 % lotion Apply 1 application topically daily.      diazepam (VALIUM) 5 MG tablet Take 1 tablet (5 mg total) by mouth every 12 (twelve) hours as needed for up to 2 doses for anxiety. 2 tablet 0   doxycycline (VIBRA-TABS) 100 MG tablet Take 1 tablet (100 mg total) by mouth daily with food 30 tablet 2   gabapentin (NEURONTIN) 300 MG capsule Take 1 capsule (300 mg total) by mouth at bedtime. 30 capsule 3   HYDROcodone-acetaminophen (NORCO) 10-325 MG tablet Take 1 tablet by mouth every 8 (eight) hours as needed. 60 tablet 0   levothyroxine (SYNTHROID) 137 MCG tablet Take 1 tablet (137 mcg total) by mouth daily. 30 tablet 2   meloxicam (MOBIC) 15 MG tablet Take 1 tablet (15 mg total) by mouth daily as needed. 30 tablet 3   metFORMIN (GLUCOPHAGE) 500 MG tablet Take 500 mg by mouth 3 (three) times daily.     Vitamin D, Ergocalciferol, (DRISDOL) 1.25 MG (50000 UNIT) CAPS capsule Take 1 capsule (50,000 Units total) by mouth every 7 (seven) days. 4 capsule 2   doxycycline (VIBRA-TABS) 100 MG tablet Take 100 mg by mouth daily.     gabapentin (NEURONTIN) 300 MG capsule Take 300 mg by mouth daily.     gabapentin (NEURONTIN) 300 MG capsule Take 1 capsule by mouth once daily at bedtime 30 capsule 3   hydrOXYzine (VISTARIL) 25 MG capsule Take 1 capsule (25 mg total) by mouth every 8 (eight) hours as needed. 30 capsule 0   naproxen (NAPROSYN) 500 MG tablet Take 1 tablet (500 mg total) by mouth 2 (two) times daily with a meal. 30 tablet 0   omeprazole (PRILOSEC) 40 MG capsule Take 1 capsule (40 mg total) by mouth daily as needed. 90 capsule 0   oxyCODONE-acetaminophen (PERCOCET/ROXICET) 5-325 MG tablet Take 1 tablet by mouth every 8 (eight)  hours as needed.     phentermine 15 MG capsule Take 1 capsule (15 mg total) by mouth every morning. 30 capsule 0   No current facility-administered medications for this visit.    REVIEW OF SYSTEMS:   Constitutional: ( - ) fevers, ( - )  chills , ( - ) night sweats Eyes: ( - ) blurriness of vision, ( - ) double vision, ( - ) watery eyes Ears, nose, mouth, throat, and face: ( - ) mucositis, ( - ) sore throat Respiratory: ( - ) cough, ( - ) dyspnea, ( - ) wheezes Cardiovascular: ( - ) palpitation, ( - ) chest discomfort, ( - )  lower extremity swelling Gastrointestinal:  ( - ) nausea, ( - ) heartburn, ( - ) change in bowel habits Skin: ( - ) abnormal skin rashes Lymphatics: ( - ) new lymphadenopathy, ( - ) easy bruising Neurological: ( - ) numbness, ( - ) tingling, ( - ) new weaknesses Behavioral/Psych: ( - ) mood change, ( - ) new changes  All other systems were reviewed with the patient and are negative.  PHYSICAL EXAMINATION:  Vitals:   05/10/22 0853  BP: (!) 145/85  Pulse: (!) 103  Resp: 17  Temp: (!) 97.3 F (36.3 C)  SpO2: 94%      Filed Weights   05/10/22 0853  Weight: 262 lb 3.2 oz (118.9 kg)      GENERAL: well appearing middle aged [place] American female alert, no distress and comfortable SKIN: skin color, texture, turgor are normal, no rashes or significant lesions EYES: conjunctiva are pink and non-injected, sclera clear LUNGS: clear to auscultation and percussion with normal breathing effort HEART: regular rate & rhythm and no murmurs and no lower extremity edema Musculoskeletal: no cyanosis of digits and no clubbing  PSYCH: alert & oriented x 3, fluent speech NEURO: no focal motor/sensory deficits  LABORATORY DATA:  I have reviewed the data as listed    Latest Ref Rng & Units 05/10/2022    8:27 AM 02/20/2022   11:06 AM 02/18/2022    4:42 PM  CBC  WBC 4.0 - 10.5 K/uL 9.2  8.0  9.5   Hemoglobin 12.0 - 15.0 g/dL 13.1  12.8  12.4   Hematocrit 36.0 -  46.0 % 37.5  38.7  38.3   Platelets 150 - 400 K/uL 275  283  284        Latest Ref Rng & Units 05/10/2022    8:27 AM 02/20/2022   11:06 AM 02/18/2022    4:42 PM  CMP  Glucose 70 - 99 mg/dL 126  100  109   BUN 6 - 20 mg/dL 7  6  8    Creatinine 0.44 - 1.00 mg/dL 0.74  0.74  0.79   Sodium 135 - 145 mmol/L 138  138  140   Potassium 3.5 - 5.1 mmol/L 3.7  3.8  3.5   Chloride 98 - 111 mmol/L 105  105  106   CO2 22 - 32 mmol/L 25  25  25    Calcium 8.9 - 10.3 mg/dL 9.2  8.9  9.1   Total Protein 6.5 - 8.1 g/dL 7.8     Total Bilirubin 0.3 - 1.2 mg/dL 0.4     Alkaline Phos 38 - 126 U/L 87     AST 15 - 41 U/L 28     ALT 0 - 44 U/L 28       Lab Results  Component Value Date   MPROTEIN 0.5 (H) 04/21/2021   MPROTEIN 0.7 (H) 10/16/2020   MPROTEIN 0.5 (H) 04/15/2020   Lab Results  Component Value Date   KPAFRELGTCHN 25.6 (H) 04/21/2021   KPAFRELGTCHN 25.7 (H) 10/16/2020   KPAFRELGTCHN 21.8 (H) 04/15/2020   LAMBDASER 19.2 04/21/2021   LAMBDASER 18.9 10/16/2020   LAMBDASER 18.7 04/15/2020   KAPLAMBRATIO 1.33 04/21/2021   KAPLAMBRATIO 1.36 10/16/2020   KAPLAMBRATIO 1.17 04/15/2020    RADIOGRAPHIC STUDIES: No results found.   ASSESSMENT & PLAN Heather Campos 47 y.o. female with medical history significant for IgG Lambda MGUS who presents for a follow up visit.   After review the labs, the records, discussion with the  patient the findings most consistent with an IgG lambda monoclonal gammopathy of undetermined significance.  The patient does not have any of the high risk features that would prompt [place] to perform a bone marrow biopsy.  She has an M protein less than 1.5 which is of IgG specificity.  Her kappa and lambda light chains are also within normal limits with a normal ratio.  Given these findings I would recommend routine follow-up in approximately 6 months time.  Recommend that we obtain yearly UPEP and metastatic surveys.  # IgG Lambda Monoclonal Gammopathy of Undetermined  Significance -- Review of labs showed normal hemoglobin, normal creatinine, and an M protein of 0.5 with a kappa lambda ratio of 1.33.  This appears to be stable. -- Other labs show white blood cell count 9.2, hemoglobin 13.1, MCV 80.6, and platelets of 275 --recommend a metastatic bone survey to be repeated yearly. Last one performed in March 2022 --assure yearly UPEP (will order this today) -- No indication for a bone marrow biopsy at this time.  --RTC in 12 months or sooner if indicated by the above labs.   Orders Placed This Encounter  Procedures   DG Bone Survey Met    Standing Status:   Future    Standing Expiration Date:   05/10/2023    Order Specific Question:   Reason for Exam (SYMPTOM  OR DIAGNOSIS REQUIRED)    Answer:   MGUS, assess for lytic lesions    Order Specific Question:   Preferred imaging location?    Answer:   [facility]   24-Hr Ur UPEP/UIFE/Light Chains/TP    Standing Status:   Future    Number of Occurrences:   1    Standing Expiration Date:   05/10/2023   All questions were answered. The patient knows to call the clinic with any problems, questions or concerns.  A total of more than 25 minutes were spent on this encounter and over half of that time was spent on counseling and coordination of care as outlined above.   Ledell Peoples, MD Department of Hematology/Oncology [place] at [facility] Phone: [phone] Pager: [phone] Email: [email or web address]  05/10/2022 3:13 PM

## 2022-05-10 ENCOUNTER — Inpatient Hospital Stay (HOSPITAL_BASED_OUTPATIENT_CLINIC_OR_DEPARTMENT_OTHER): Payer: No Typology Code available for payment source | Admitting: Hematology and Oncology

## 2022-05-10 ENCOUNTER — Inpatient Hospital Stay: Payer: No Typology Code available for payment source | Attending: Hematology and Oncology

## 2022-05-10 VITALS — BP 145/85 | HR 103 | Temp 97.3°F | Resp 17 | Wt 262.2 lb

## 2022-05-10 DIAGNOSIS — D472 Monoclonal gammopathy: Secondary | ICD-10-CM

## 2022-05-10 LAB — CMP (CANCER CENTER ONLY)
ALT: 28 U/L (ref 0–44)
AST: 28 U/L (ref 15–41)
Albumin: 4.2 g/dL (ref 3.5–5.0)
Alkaline Phosphatase: 87 U/L (ref 38–126)
Anion gap: 8 (ref 5–15)
BUN: 7 mg/dL (ref 6–20)
CO2: 25 mmol/L (ref 22–32)
Calcium: 9.2 mg/dL (ref 8.9–10.3)
Chloride: 105 mmol/L (ref 98–111)
Creatinine: 0.74 mg/dL (ref 0.44–1.00)
GFR, Estimated: >60 mL/min (ref >60)
Glucose, Bld: 126 mg/dL — ABNORMAL HIGH (ref 70–99)
Potassium: 3.7 mmol/L (ref 3.5–5.1)
Sodium: 138 mmol/L (ref 135–145)
Total Bilirubin: 0.4 mg/dL (ref 0.3–1.2)
Total Protein: 7.8 g/dL (ref 6.5–8.1)

## 2022-05-10 LAB — CBC WITH DIFFERENTIAL (CANCER CENTER ONLY)
Abs Immature Granulocytes: 0.04 10*3/uL (ref 0.00–0.07)
Basophils Absolute: 0 10*3/uL (ref 0.0–0.1)
Basophils Relative: 0 %
Eosinophils Absolute: 0.2 10*3/uL (ref 0.0–0.5)
Eosinophils Relative: 2 %
HCT: 37.5 % (ref 36.0–46.0)
Hemoglobin: 13.1 g/dL (ref 12.0–15.0)
Immature Granulocytes: 0 %
Lymphocytes Relative: 36 %
Lymphs Abs: 3.3 10*3/uL (ref 0.7–4.0)
MCH: 28.2 pg (ref 26.0–34.0)
MCHC: 34.9 g/dL (ref 30.0–36.0)
MCV: 80.6 fL (ref 80.0–100.0)
Monocytes Absolute: 0.7 10*3/uL (ref 0.1–1.0)
Monocytes Relative: 8 %
Neutro Abs: 4.9 10*3/uL (ref 1.7–7.7)
Neutrophils Relative %: 54 %
Platelet Count: 275 10*3/uL (ref 150–400)
RBC: 4.65 MIL/uL (ref 3.87–5.11)
RDW: 13.1 % (ref 11.5–15.5)
WBC Count: 9.2 10*3/uL (ref 4.0–10.5)
nRBC: 0 % (ref 0.0–0.2)

## 2022-05-10 LAB — LACTATE DEHYDROGENASE: LDH: 178 U/L (ref 98–192)

## 2022-05-11 LAB — KAPPA/LAMBDA LIGHT CHAINS
Kappa free light chain: 30.6 mg/L — ABNORMAL HIGH (ref 3.3–19.4)
Kappa, lambda light chain ratio: 1.41 (ref 0.26–1.65)
Lambda free light chains: 21.7 mg/L (ref 5.7–26.3)

## 2022-05-11 LAB — BETA 2 MICROGLOBULIN, SERUM: Beta-2 Microglobulin: 1.3 mg/L (ref 0.6–2.4)

## 2022-05-12 LAB — UPEP/UIFE/LIGHT CHAINS/TP, 24-HR UR
% BETA, Urine: 0 %
ALPHA 1 URINE: 0 %
Albumin, U: 100 %
Alpha 2, Urine: 0 %
Free Kappa Lt Chains,Ur: 4.52 mg/L (ref 1.17–86.46)
Free Kappa/Lambda Ratio: 5.38 (ref 1.83–14.26)
Free Lambda Lt Chains,Ur: 0.84 mg/L (ref 0.27–15.21)
GAMMA GLOBULIN URINE: 0 %
Total Protein, Urine-Ur/day: 141 mg/24 hr (ref 30–150)
Total Protein, Urine: 4.7 mg/dL
Total Volume: 3000

## 2022-05-16 LAB — MULTIPLE MYELOMA PANEL, SERUM
Albumin SerPl Elph-Mcnc: 3.8 g/dL (ref 2.9–4.4)
Albumin/Glob SerPl: 1.1 (ref 0.7–1.7)
Alpha 1: 0.2 g/dL (ref 0.0–0.4)
Alpha2 Glob SerPl Elph-Mcnc: 0.7 g/dL (ref 0.4–1.0)
B-Globulin SerPl Elph-Mcnc: 1.2 g/dL (ref 0.7–1.3)
Gamma Glob SerPl Elph-Mcnc: 1.6 g/dL (ref 0.4–1.8)
Globulin, Total: 3.7 g/dL (ref 2.2–3.9)
IgA: 183 mg/dL (ref 87–352)
IgG (Immunoglobin G), Serum: 1750 mg/dL — ABNORMAL HIGH (ref 586–1602)
IgM (Immunoglobulin M), Srm: 34 mg/dL (ref 26–217)
M Protein SerPl Elph-Mcnc: 0.5 g/dL — ABNORMAL HIGH
Total Protein ELP: 7.5 g/dL (ref 6.0–8.5)

## 2022-05-17 NOTE — Progress Notes (Signed)
Patient ID: Heather Campos, female    DOB: 29-Aug-1975  MRN: [id number]  CC: Chronic Care Management   Subjective: Heather Campos is a 47 y.o. female who presents for chronic care management.   Her concerns today include:  - Doing well on Levothyroxine, no issues/concerns. She does not need refills.  - Taking Lexapro as needed for anxiety, no issues/concerns.  - Intermittent headache with no red flag symptoms. States she plans to trial Butalbital-APAP-Caffeine. Reports she needs a new referral to Neurology. States previous referral told her she owes them a payment before she can schedule.  - Would like diabetes and vitamin D screening.  - Would like prescription for daily multivitamin.  - No further issues/concerns for discussion today.      05/24/2022    8:18 AM 02/18/2022    9:44 AM 03/05/2021   10:36 AM 08/28/2020   12:05 PM 05/15/2020   10:07 AM  Depression screen PHQ 2/9  Decreased Interest 0 2 0 0 0  Down, Depressed, Hopeless 0 2 0 0 0  PHQ - 2 Score 0 4 0 0 0  Altered sleeping 0 3  0 0  Tired, decreased energy 0 2  0 0  Change in appetite 0 1  0 0  Feeling bad or failure about yourself  0 0  0 0  Trouble concentrating 0 1  0 0  Moving slowly or fidgety/restless 0 0  0 0  Suicidal thoughts 0 0  0 0  PHQ-9 Score 0 11  0 0  Difficult doing work/chores Not difficult at all Somewhat difficult  Not difficult at all      Patient Active Problem List   Diagnosis Date Noted   Anxiety and depression 12/24/2021   Cerumen debris on tympanic membrane of left ear 09/30/2021   Hypothyroidism 08/20/2021   Chronic pain 08/17/2021   Lumbar spondylosis 06/29/2021   Bilateral carpal tunnel syndrome 06/23/2021   Pain in joint of left knee 02/04/2021   COVID-19 11/27/2020   Class 3 severe obesity due to excess calories without serious comorbidity with body mass index (BMI) of 40.0 to 44.9 in adult 11/27/2020   Pain in joint of right shoulder 10/06/2020   Calcific tendinitis of  right shoulder 10/06/2020   Low back pain 08/13/2020   Insomnia 04/14/2020   Pain of toe of right foot 02/24/2020   Stiffness of left shoulder joint 11/14/2019   Adhesive capsulitis of left shoulder 10/02/2019   Shoulder pain, left 09/03/2019   Calcific tendinitis of left shoulder 09/03/2019   Fibroid uterus 07/04/2019   Uterine fibroid 07/04/2019   Tachycardia 05/14/2019   History of COVID-19 05/13/2019   Hair loss 05/13/2019   OSA (obstructive sleep apnea) 02/09/2016   Family history of systemic lupus erythematosus 02/09/2016   Vitamin D deficiency 09/02/2015   Morbid obesity due to excess calories 09/02/2015   Iron deficiency anemia due to chronic blood loss 09/02/2015   Hypothyroidism due to Hashimoto's thyroiditis 09/02/2015     Current Outpatient Medications on File Prior to Visit  Medication Sig Dispense Refill   metFORMIN (GLUCOPHAGE) 500 MG tablet Take 500 mg by mouth 3 (three) times daily.     Butalbital-APAP-Caffeine 50-300-40 MG CAPS Take 1 tablet by mouth every 6 (six) hours as needed. 40 capsule 0   clindamycin (CLEOCIN T) 1 % lotion Apply 1 application topically daily.      hydrOXYzine (VISTARIL) 25 MG capsule Take 1 capsule (25 mg total) by mouth every 8 (  eight) hours as needed. 30 capsule 0   omeprazole (PRILOSEC) 40 MG capsule Take 1 capsule (40 mg total) by mouth daily as needed. 90 capsule 0   Vitamin D, Ergocalciferol, (DRISDOL) 1.25 MG (50000 UNIT) CAPS capsule Take 1 capsule (50,000 Units total) by mouth every 7 (seven) days. (Patient not taking: Reported on 05/24/2022) 4 capsule 2   No current facility-administered medications on file prior to visit.    Allergies  Allergen Reactions   No Known Allergies     Social History   Socioeconomic History   Marital status: Married    Spouse name: Not on file   Number of children: Not on file   Years of education: Not on file   Highest education level: Some college, no degree  Occupational History   Not on  file  Tobacco Use   Smoking status: Former    Passive exposure: Past   Smokeless tobacco: Never  Vaping Use   Vaping Use: Never used  Substance and Sexual Activity   Alcohol use: No   Drug use: No   Sexual activity: Yes    Birth control/protection: None  Other Topics Concern   Not on file  Social History Narrative   Not on file   Social Determinants of Health   Financial Resource Strain: Low Risk  (05/23/2022)   Overall Financial Resource Strain (CARDIA)    Difficulty of Paying Living Expenses: Not hard at all  Food Insecurity: No Food Insecurity (05/23/2022)   Hunger Vital Sign    Worried About Running Out of Food in the Last Year: Never true    [place] in the Last Year: Never true  Transportation Needs: No Transportation Needs (05/23/2022)   PRAPARE - Hydrologist (Medical): No    Lack of Transportation (Non-Medical): No  Physical Activity: Unknown (05/23/2022)   Exercise Vital Sign    Days of Exercise per Week: 0 days    Minutes of Exercise per Session: Not on file  Stress: Stress Concern Present (05/23/2022)   [place]    Feeling of Stress : To some extent  Social Connections: Socially Integrated (05/23/2022)   Social Connection and Isolation Panel [NHANES]    Frequency of Communication with Friends and Family: More than three times a week    Frequency of Social Gatherings with Friends and Family: Twice a week    Attends Religious Services: More than 4 times per year    Active Member of Clubs or Organizations: Yes    Attends Archivist Meetings: 1 to 4 times per year    Marital Status: Married  Human resources officer Violence: Not on file    Family History  Problem Relation Age of Onset   Lupus Mother    Kidney disease Mother    Lupus Cousin     Past Surgical History:  Procedure Laterality Date   ABDOMINAL HYSTERECTOMY     CESAREAN SECTION     CHOLECYSTECTOMY      CYSTOSCOPY  07/04/2019   Procedure: CYSTOSCOPY;  Surgeon: Sanjuana Kava, MD;  Location: [place];  Service: Gynecology;;   HYSTERECTOMY ABDOMINAL WITH SALPINGECTOMY Bilateral 07/04/2019   Procedure: ABDOMINAL  suprcervical hysterctomyWITH SALPINGECTOMY ;  Surgeon: Sanjuana Kava, MD;  Location: [place];  Service: Gynecology;  Laterality: Bilateral;   TOTAL LAPAROSCOPIC HYSTERECTOMY WITH SALPINGECTOMY Bilateral 07/04/2019   Procedure: attemted TOTAL LAPAROSCOPIC HYSTERECTOMY WITH SALPINGECTOMY;  Surgeon: Sanjuana Kava,  MD;  Location: [place];  Service: Gynecology;  Laterality: Bilateral;  3 hours    ROS: Review of Systems Negative except as stated above  PHYSICAL EXAM: BP 129/89 (BP Location: Right Arm, Patient Position: Sitting, Cuff Size: Large)   Pulse 72   Temp 98.5 F (36.9 C)   Resp 16   Ht 5' 2.99" (1.6 m)   Wt 268 lb 3.2 oz (121.7 kg)   LMP 06/24/2019   SpO2 98%   BMI 47.52 kg/m   Physical Exam HENT:     Head: Normocephalic and atraumatic.  Eyes:     Extraocular Movements: Extraocular movements intact.     Conjunctiva/sclera: Conjunctivae normal.     Pupils: Pupils are equal, round, and reactive to light.  Cardiovascular:     Rate and Rhythm: Normal rate and regular rhythm.     Pulses: Normal pulses.     Heart sounds: Normal heart sounds.  Pulmonary:     Effort: Pulmonary effort is normal.     Breath sounds: Normal breath sounds.  Musculoskeletal:     Cervical back: Normal range of motion and neck supple.  Neurological:     General: No focal deficit present.     Mental Status: She is alert and oriented to person, place, and time.  Psychiatric:        Mood and Affect: Mood normal.        Behavior: Behavior normal.     ASSESSMENT AND PLAN: 1. Hypothyroidism, unspecified type - Continue Levothyroxine as prescribed.  - Follow-up with primary provider in 3 months or sooner if needed.  - levothyroxine (SYNTHROID)  137 MCG tablet; Take 1 tablet (137 mcg total) by mouth daily.  Dispense: 90 tablet; Refill: 0  2. Anxiety and depression - Patient denies thoughts of self-harm, suicidal ideations, homicidal ideations. - Continue Escitalopram as prescribed. No refills needed as of present.  - Follow-up with primary provider in 3 months or sooner if needed.   3. Empty sella 4. Nonintractable headache, unspecified chronicity pattern, unspecified headache type - Referral to Neurology for further evaluation/management.  - Ambulatory referral to Neurology  5. Diabetes mellitus screening - Routine screening.  - Hemoglobin A1c  6. Vitamin D deficiency - Routine screening.  - Multiple Vitamins-Minerals as prescribed.  - Follow-up with primary provider as scheduled.  - Vitamin D, 25-hydroxy - Multiple Vitamins-Minerals (MULTIVITAMIN) tablet; Take 1 tablet by mouth daily.  Dispense: 90 tablet; Refill: 0   Patient was given the opportunity to ask questions.  Patient verbalized understanding of the plan and was able to repeat key elements of the plan. Patient was given clear instructions to go to Emergency Department or return to medical center if symptoms don't improve, worsen, or new problems develop.The patient verbalized understanding.   Orders Placed This Encounter  Procedures   Hemoglobin A1c   Vitamin D, 25-hydroxy   Ambulatory referral to Neurology     Requested Prescriptions   Signed Prescriptions Disp Refills   levothyroxine (SYNTHROID) 137 MCG tablet 90 tablet 0    Sig: Take 1 tablet (137 mcg total) by mouth daily.   Multiple Vitamins-Minerals (MULTIVITAMIN) tablet 90 tablet 0    Sig: Take 1 tablet by mouth daily.    Return in about 3 months (around 08/23/2022) for Follow-Up or next available chronic care mgmt .  Camillia Herter, NP

## 2022-05-24 ENCOUNTER — Ambulatory Visit (INDEPENDENT_AMBULATORY_CARE_PROVIDER_SITE_OTHER): Payer: No Typology Code available for payment source | Admitting: Family

## 2022-05-24 ENCOUNTER — Other Ambulatory Visit (HOSPITAL_COMMUNITY): Payer: Self-pay

## 2022-05-24 VITALS — BP 129/89 | HR 72 | Temp 98.5°F | Resp 16 | Ht 62.99 in | Wt 268.2 lb

## 2022-05-24 DIAGNOSIS — Z131 Encounter for screening for diabetes mellitus: Secondary | ICD-10-CM

## 2022-05-24 DIAGNOSIS — E039 Hypothyroidism, unspecified: Secondary | ICD-10-CM | POA: Diagnosis not present

## 2022-05-24 DIAGNOSIS — Z6841 Body Mass Index (BMI) 40.0 and over, adult: Secondary | ICD-10-CM

## 2022-05-24 DIAGNOSIS — E559 Vitamin D deficiency, unspecified: Secondary | ICD-10-CM

## 2022-05-24 DIAGNOSIS — F32A Depression, unspecified: Secondary | ICD-10-CM

## 2022-05-24 DIAGNOSIS — F419 Anxiety disorder, unspecified: Secondary | ICD-10-CM

## 2022-05-24 DIAGNOSIS — R519 Headache, unspecified: Secondary | ICD-10-CM

## 2022-05-24 DIAGNOSIS — E236 Other disorders of pituitary gland: Secondary | ICD-10-CM

## 2022-05-24 MED ORDER — THERA VITAL M PO TABS
1.0000 | ORAL_TABLET | Freq: Every day | ORAL | 0 refills | Status: DC
  Filled 2022-05-24 – 2022-09-12 (×3): qty 90, 90d supply, fill #0

## 2022-05-24 MED ORDER — LEVOTHYROXINE SODIUM 137 MCG PO TABS
137.0000 ug | ORAL_TABLET | Freq: Every day | ORAL | 0 refills | Status: DC
  Filled 2022-05-24: qty 30, 30d supply, fill #0
  Filled 2022-06-15: qty 30, 30d supply, fill #1
  Filled 2022-07-13: qty 30, 30d supply, fill #2

## 2022-05-24 NOTE — Progress Notes (Signed)
Pt complaining of pain on the right side of her head started Last night Pt denies any pain at this time Denies any injury to same Pt is requesting 90 day refill on medications and requesting A1C and vitamin D be checked

## 2022-05-24 NOTE — Progress Notes (Unsigned)
NEUROLOGY CONSULTATION NOTE  Heather Campos MRN: [id number] DOB: [birth year]  Referring provider: Durene Fruits, NP Primary care provider: Durene Fruits, NP  Reason for consult:  headache  Assessment/Plan:   Right unilateral headache - unclear etiology.  Not consistent with migraine, tension type headache. Does not appear to be TMJ dysfunction or cervicogenic.  No clear autonomic symptoms to suggest hemicrania continua  Check CTA of head Check sed rate and crp Stop Fioricet.  May take Tylenol or ibuprofen but limit to no more than 2 days out of week to prevent rebound headache. Will hold off on starting a daily preventative medication until after testing and monitor if headache continues to improve.  Will start something if headaches worsen     Subjective:  Heather Campos is a 47 year old female with hypothyroidism, MGUS, chronic back pain and OSA who presents for headaches.  History supplemented by ED and referring provider's note.  On 02/18/2022 she was sitting watching TV when she suddenly felt a pop in the right temple followed by onset of severe persistent non-throbbing right temporal headache.  Went to the ED but left due to long wait.  Blood pressure reportedly elevated at 99991111 systolic.  Headache persisted so she returned to the ED on 12/31.  Blood pressure was elevated at 99991111 systolic.  Sed rate was 16.  MRI of brain without contrast personally reviewed showed partially empty sella but otherwise unremarkable.  Treated with headache cocktail which helped and she was discharged on Fioricet.    She was fine until two days ago.  She was laying down that night when she suddenly developed a severe right stabbing parietal pain for 2-3 minutes and gradually reduced to moderate pain involving the right temple as well.  When laying on her right side, the temple felt tender.  Her fight temple feels "puffy".  No change in vision, ptosis, eye lacrimation.  No jaw or neck pain.  She had  a formal eye exam in January which didn't reveal papilledema.  She did need glasses.    Prior to all this, she may have a mild tension type headache from time to time but nothing like these headaches.    No known family history of aneurysm but her grandmother had a brain bleed (unknown etiology).  Past NSAIDS/analgesics:  meloxicam Past abortive triptans:  none Past abortive ergotamine:  none Past muscle relaxants:  methocarbamol Past anti-emetic:  none Past antihypertensive medications:  none Past antidepressant medications:  sertraline Past anticonvulsant medications:  gabapentin Past anti-CGRP:  none Past vitamins/Herbal/Supplements:  none Past antihistamines/decongestants:  Flonase Other past therapies:  none  Current NSAIDS/analgesics:  Fioricet Current triptans:  none Current ergotamine:  none Current anti-emetic:  none Current muscle relaxants:  none Current Antihypertensive medications:  none Current Antidepressant medications:  none Current Anticonvulsant medications:  none Current anti-CGRP:  none Current Vitamins/Herbal/Supplements:  MVI Current Antihistamines/Decongestants:  none Other therapy:  none Birth control:  none Other medications:  hydroxyzine, levothyroxine       PAST MEDICAL HISTORY: Past Medical History:  Diagnosis Date   Allergic rhinitis    Anxiety    Chronic back pain    Family history of systemic lupus erythematosus    GERD (gastroesophageal reflux disease)    Hypothyroidism due to Hashimoto's thyroiditis    IDA (iron deficiency anemia)    Morbid obesity    OSA (obstructive sleep apnea)    No CPAP   Vitamin D deficiency     PAST SURGICAL  HISTORY: Past Surgical History:  Procedure Laterality Date   ABDOMINAL HYSTERECTOMY     CESAREAN SECTION     CHOLECYSTECTOMY     CYSTOSCOPY  07/04/2019   Procedure: CYSTOSCOPY;  Surgeon: Sanjuana Kava, MD;  Location: [place];  Service: Gynecology;;   HYSTERECTOMY ABDOMINAL WITH  SALPINGECTOMY Bilateral 07/04/2019   Procedure: ABDOMINAL  suprcervical hysterctomyWITH SALPINGECTOMY ;  Surgeon: Sanjuana Kava, MD;  Location: [place];  Service: Gynecology;  Laterality: Bilateral;   TOTAL LAPAROSCOPIC HYSTERECTOMY WITH SALPINGECTOMY Bilateral 07/04/2019   Procedure: attemted TOTAL LAPAROSCOPIC HYSTERECTOMY WITH SALPINGECTOMY;  Surgeon: Sanjuana Kava, MD;  Location: [place];  Service: Gynecology;  Laterality: Bilateral;  3 hours    MEDICATIONS: Current Outpatient Medications on File Prior to Visit  Medication Sig Dispense Refill   Butalbital-APAP-Caffeine 50-300-40 MG CAPS Take 1 tablet by mouth every 6 (six) hours as needed. 40 capsule 0   clindamycin (CLEOCIN T) 1 % lotion Apply 1 application topically daily.      hydrOXYzine (VISTARIL) 25 MG capsule Take 1 capsule (25 mg total) by mouth every 8 (eight) hours as needed. 30 capsule 0   levothyroxine (SYNTHROID) 137 MCG tablet Take 1 tablet (137 mcg total) by mouth daily. 90 tablet 0   metFORMIN (GLUCOPHAGE) 500 MG tablet Take 500 mg by mouth 3 (three) times daily.     Multiple Vitamins-Minerals (MULTIVITAMIN) tablet Take 1 tablet by mouth daily. 90 tablet 0   omeprazole (PRILOSEC) 40 MG capsule Take 1 capsule (40 mg total) by mouth daily as needed. 90 capsule 0   Vitamin D, Ergocalciferol, (DRISDOL) 1.25 MG (50000 UNIT) CAPS capsule Take 1 capsule (50,000 Units total) by mouth every 7 (seven) days. (Patient not taking: Reported on 05/24/2022) 4 capsule 2   No current facility-administered medications on file prior to visit.    ALLERGIES: Allergies  Allergen Reactions   No Known Allergies     FAMILY HISTORY: Family History  Problem Relation Age of Onset   Lupus Mother    Kidney disease Mother    Lupus Cousin     Objective:  Blood pressure 134/86, pulse 83, height 5\' 4"  (1.626 m), weight 267 lb 6.4 oz (121.3 kg), last menstrual period 06/24/2019, SpO2 97 %. General: No acute  distress.  Patient appears well-groomed.   Head:  Normocephalic/atraumatic, tenderness to palpation of right temple; no TMJ tenderness Eyes:  fundi examined but not visualized Neck: supple, no paraspinal tenderness, full range of motion Back: No paraspinal tenderness Heart: regular rate and rhythm Lungs: Clear to auscultation bilaterally. Vascular: No carotid bruits. Neurological Exam: Mental status: alert and oriented to person, place, and time, speech fluent and not dysarthric, language intact. Cranial nerves: CN I: not tested CN II: pupils equal, round and reactive to light, visual fields intact CN III, IV, VI:  full range of motion, no nystagmus, no ptosis CN V: facial sensation intact. CN VII: upper and lower face symmetric CN VIII: hearing intact CN IX, X: gag intact, uvula midline CN XI: sternocleidomastoid and trapezius muscles intact CN XII: tongue midline Bulk & Tone: normal, no fasciculations. Motor:  muscle strength 5/5 throughout Sensation:  Temperature and vibratory sensation intact. Deep Tendon Reflexes:  2+ throughout,  toes downgoing.   Finger to nose testing:  Without dysmetria.   Heel to shin:  Without dysmetria.   Gait:  Normal station and stride.  Romberg negative.    Thank you for allowing me to take part in the care of this  patient.  Metta Clines, DO  CC: Durene Fruits, NP

## 2022-05-25 ENCOUNTER — Other Ambulatory Visit: Payer: Self-pay | Admitting: Family

## 2022-05-25 ENCOUNTER — Other Ambulatory Visit (HOSPITAL_COMMUNITY): Payer: Self-pay

## 2022-05-25 ENCOUNTER — Ambulatory Visit: Payer: No Typology Code available for payment source | Admitting: Neurology

## 2022-05-25 ENCOUNTER — Encounter: Payer: Self-pay | Admitting: Neurology

## 2022-05-25 ENCOUNTER — Ambulatory Visit (HOSPITAL_COMMUNITY)
Admission: RE | Admit: 2022-05-25 | Discharge: 2022-05-25 | Disposition: A | Discharge status: Home / Self Care | Payer: No Typology Code available for payment source | Source: Ambulatory Visit | Attending: Hematology and Oncology | Admitting: Hematology and Oncology

## 2022-05-25 ENCOUNTER — Telehealth: Payer: Self-pay | Admitting: Anesthesiology

## 2022-05-25 ENCOUNTER — Other Ambulatory Visit (INDEPENDENT_AMBULATORY_CARE_PROVIDER_SITE_OTHER): Payer: No Typology Code available for payment source

## 2022-05-25 ENCOUNTER — Ambulatory Visit
Admission: RE | Admit: 2022-05-25 | Discharge: 2022-05-25 | Disposition: A | Discharge status: Home / Self Care | Payer: No Typology Code available for payment source | Source: Ambulatory Visit | Attending: Neurology

## 2022-05-25 VITALS — BP 134/86 | HR 83 | Ht 64.0 in | Wt 267.4 lb

## 2022-05-25 DIAGNOSIS — R519 Headache, unspecified: Secondary | ICD-10-CM

## 2022-05-25 DIAGNOSIS — E559 Vitamin D deficiency, unspecified: Secondary | ICD-10-CM

## 2022-05-25 DIAGNOSIS — R930 Abnormal findings on diagnostic imaging of skull and head, not elsewhere classified: Secondary | ICD-10-CM

## 2022-05-25 DIAGNOSIS — D472 Monoclonal gammopathy: Secondary | ICD-10-CM | POA: Insufficient documentation

## 2022-05-25 DIAGNOSIS — E236 Other disorders of pituitary gland: Secondary | ICD-10-CM

## 2022-05-25 DIAGNOSIS — R7303 Prediabetes: Secondary | ICD-10-CM | POA: Insufficient documentation

## 2022-05-25 LAB — SEDIMENTATION RATE: Sed Rate: 38 mm/hr — ABNORMAL HIGH (ref 0–20)

## 2022-05-25 LAB — HEMOGLOBIN A1C
Est. average glucose Bld gHb Est-mCnc: 123 mg/dL
Hgb A1c MFr Bld: 5.9 % — ABNORMAL HIGH (ref 4.8–5.6)

## 2022-05-25 LAB — VITAMIN D 25 HYDROXY (VIT D DEFICIENCY, FRACTURES): Vit D, 25-Hydroxy: 17.9 ng/mL — ABNORMAL LOW (ref 30.0–100.0)

## 2022-05-25 LAB — C-REACTIVE PROTEIN: CRP: 1.1 mg/dL (ref 0.5–20.0)

## 2022-05-25 MED ORDER — VITAMIN D (ERGOCALCIFEROL) 1.25 MG (50000 UNIT) PO CAPS: 50000.0000 [IU] | ORAL_CAPSULE | ORAL | 2 refills | Status: DC

## 2022-05-25 MED ORDER — IOPAMIDOL (ISOVUE-300) INJECTION 61%
78.0000 mL | Freq: Once | INTRAVENOUS | Status: AC | PRN
  Administered 2022-05-25: 78 mL via INTRAVENOUS

## 2022-05-25 NOTE — Patient Instructions (Signed)
Check sed rate and CRP Check CTA head Stop the butalbital medication.  For headache, may take ibuprofen or Tylenol but limit to no more than 2 days out of week to prevent rebound headache Hold off on starting a daily medication for now until at least after testing.  If headaches get worse, let me know and we will start something.

## 2022-05-25 NOTE — Telephone Encounter (Signed)
Pt called requesting lab results

## 2022-05-25 NOTE — Telephone Encounter (Signed)
Awaiting results to be read by neurologist

## 2022-05-26 ENCOUNTER — Other Ambulatory Visit: Payer: No Typology Code available for payment source

## 2022-05-27 ENCOUNTER — Telehealth: Payer: Self-pay | Admitting: *Deleted

## 2022-05-27 NOTE — Telephone Encounter (Signed)
Notified of message below

## 2022-05-27 NOTE — Progress Notes (Signed)
Patient advised of her lab results.

## 2022-05-27 NOTE — Telephone Encounter (Signed)
-----   Message from Jaci Standard, MD sent at 05/27/2022  8:15 AM EDT ----- Please let Heather Campos know that the Bone survey looked good, no evidence of bone damage from the MGUS. We will plan to see her back as scheduled in March 2025.   ----- Message ----- From: Leory Plowman, Rad Results In Sent: 05/27/2022   1:47 AM EDT To: Jaci Standard, MD

## 2022-05-29 NOTE — Progress Notes (Unsigned)
03/01/22- 88 yoF former smoker for sleep evaluation with concern of OSA Medical problem list includes OSA, Allergic Rhinitis, GERD, Hypothyroid, Morbid Obesity, Anxiety/ Depression,  NPSG 09/17/18-[facility]/ [facility]- AHI 2.4/ hr, desaturation to 75%, body weight 252 lbs HST 04/07/20-AHI 22.8/ hr, desaturation to 77%, body weight  Epworth score-13 Body weight today-263 lbs Covid vax- Flu vax-- Hx of OSA. Slept much better with CPAP until machine lost in a move. Wants to re-start. Husband c/o her snore. Tired in day. Sleep feels fragmented, nonrestorative. No ENT surgery, heart or lung problems.  05/31/22- Virtual Visit via Video Note  I connected with Heather Campos on 05/31/22 at  9:30 AM EDT by a video enabled telemedicine application and verified that I am speaking with the correct person using two identifiers.  Location: Patient: home Provider: office   I discussed the limitations of evaluation and management by telemedicine and the availability of in person appointments. The patient expressed understanding and agreed to proceed.  History of Present Illness 31 yoF former smoker followed for OSA,complicated by Allergic Rhinitis, GERD, Hypothyroid, Morbid Obesity, Anxiety/ Depression,  CPAP auto 5-15/ [employer]- ordered 03/01/22 (replacing lost machine) Pt's grandchild had fever, positive for Covid so pt exposed> asked video visit today. Doing fine w CPAP, sleeping ok. No acute needs or concerns for [place].  Observations/Objective: Download compliance- 93%, AHI 0.1/ hr  Assessment and Plan: OSA- continue CPAP 5-15 Headache- to f/u with Neurology Obesity- long term lifestyle issue  Follow Up Instructions:    I discussed the assessment and treatment plan with the patient. The patient was provided an opportunity to ask questions and all were answered. The patient agreed with the plan and demonstrated an understanding of the instructions.   The patient was advised to call back or seek an in-person  evaluation if the symptoms worsen or if the condition fails to improve as anticipated.  I provided 20 minutes of non-face-to-face time during this encounter.   Jetty Duhamel, MD  -------------------------------------------------------------------------  ROS-see HPI   + = positive Constitutional:    weight loss, night sweats, fevers, chills, fatigue, lassitude. HEENT:    +headaches, difficulty swallowing, tooth/dental problems, sore throat,       sneezing, itching, ear ache, nasal congestion, post nasal drip, snoring CV:    chest pain, orthopnea, PND, swelling in lower extremities, anasarca,                                   dizziness, palpitations Resp:   shortness of breath with exertion or at rest.                productive cough,   non-productive cough, coughing up of blood.              change in color of mucus.  wheezing.   Skin:    rash or lesions. GI:  No-   heartburn, indigestion, abdominal pain, nausea, vomiting, diarrhea,                 change in bowel habits, loss of appetite GU: dysuria, change in color of urine, no urgency or frequency.   flank pain. MS:   joint pain, stiffness, decreased range of motion, back pain. Neuro-     nothing unusual Psych:  change in mood or affect.  depression or +anxiety.   memory loss.  OBJ- Physical Exam    + hijab General- Alert, Oriented, Affect-appropriate, Distress- none  acute, +obese,  Skin- rash-none, lesions- none, excoriation- none Lymphadenopathy- none Head- atraumatic            Eyes- Gross vision intact, PERRLA, conjunctivae and secretions clear            Ears- Hearing, canals-normal            Nose- Clear, no-Septal dev, mucus, polyps, erosion, perforation             Throat- Mallampati IV , mucosa clear , drainage- none, tonsils- atrophic Neck- flexible , trachea midline, no stridor , thyroid nl, carotid no bruit Chest - symmetrical excursion , unlabored           Heart/CV- RRR , no murmur , no gallop  , no rub, nl s1  s2                           - JVD- none , edema- none, stasis changes- none, varices- none           Lung- clear to P&A, wheeze- none, cough- none , dullness-none, rub- none           Chest wall-  Abd-  Br/ Gen/ Rectal- Not done, not indicated Extrem- cyanosis- none, clubbing, none, atrophy- none, strength- nl Neuro- grossly intact to observation

## 2022-05-30 NOTE — Telephone Encounter (Signed)
Dr. Everlena Cooper patient.  Called and informed patient of her CTA results which shows "partially empty sella and severe bilateral distal transverse sinus stenoses which can be seen with idiopathic intracranial hypertension."  I recommend that she undergo LP to check CSF pressure, which she is in agreement with.  Patient denies any vision symptoms or ongoing headaches.  All questions answered.  Lizette Pazos K. Allena Katz, DO

## 2022-05-30 NOTE — Telephone Encounter (Signed)
Per Dr.Patel   Please order lumbar puncture to check opening and closing pressure.  Please specific in comments, "If elevated, please do high volume LP."  Check CSF cell count and diff, glucose, and protein.  Thanks.

## 2022-05-31 ENCOUNTER — Telehealth (INDEPENDENT_AMBULATORY_CARE_PROVIDER_SITE_OTHER): Payer: No Typology Code available for payment source | Admitting: Internal Medicine

## 2022-05-31 ENCOUNTER — Encounter: Payer: Self-pay | Admitting: Internal Medicine

## 2022-05-31 DIAGNOSIS — G4733 Obstructive sleep apnea (adult) (pediatric): Secondary | ICD-10-CM | POA: Diagnosis not present

## 2022-05-31 NOTE — Assessment & Plan Note (Signed)
Fitting from CPAP with good compliance and control.  Comfortable with current pressure and mask. Continue auto 5-15

## 2022-05-31 NOTE — Assessment & Plan Note (Signed)
Continues as long-term lifestyle issue

## 2022-06-02 ENCOUNTER — Encounter: Payer: Self-pay | Admitting: Internal Medicine

## 2022-06-02 ENCOUNTER — Telehealth: Payer: Self-pay

## 2022-06-02 NOTE — Telephone Encounter (Signed)
Patient is calling in wondering if she can get Xanax prescribed to help calm her nerves prior to the Lumbar puncture on 4/24.

## 2022-06-07 ENCOUNTER — Other Ambulatory Visit: Payer: Self-pay | Admitting: Neurology

## 2022-06-07 MED ORDER — DIAZEPAM 5 MG PO TABS: ORAL_TABLET | ORAL | 0 refills | Status: DC

## 2022-06-07 NOTE — Telephone Encounter (Signed)
Called and informed pt of message per Dr. Everlena Cooper

## 2022-06-15 ENCOUNTER — Ambulatory Visit
Admission: RE | Admit: 2022-06-15 | Discharge: 2022-06-15 | Disposition: A | Discharge status: Home / Self Care | Payer: No Typology Code available for payment source | Source: Ambulatory Visit | Attending: Neurology | Admitting: Neurology

## 2022-06-15 ENCOUNTER — Other Ambulatory Visit: Payer: Self-pay | Admitting: Family

## 2022-06-15 VITALS — BP 136/87 | HR 71

## 2022-06-15 DIAGNOSIS — R519 Headache, unspecified: Secondary | ICD-10-CM

## 2022-06-15 DIAGNOSIS — E236 Other disorders of pituitary gland: Secondary | ICD-10-CM

## 2022-06-15 DIAGNOSIS — R930 Abnormal findings on diagnostic imaging of skull and head, not elsewhere classified: Secondary | ICD-10-CM

## 2022-06-15 LAB — CSF CELL COUNT WITH DIFFERENTIAL
RBC Count, CSF: 68 cells/uL — ABNORMAL HIGH
TOTAL NUCLEATED CELL: 2 cells/uL (ref 0–5)

## 2022-06-15 LAB — GLUCOSE, CSF: Glucose, CSF: 57 mg/dL (ref 40–80)

## 2022-06-15 LAB — PROTEIN, CSF: Total Protein, CSF: 39 mg/dL (ref 15–45)

## 2022-06-15 NOTE — Discharge Instructions (Signed)

## 2022-06-15 NOTE — Telephone Encounter (Signed)
Clindamycin appears as historical medication and has not been prescribed by primary care. Please call patient to get more details about prescription request and/or schedule appointment.

## 2022-06-16 ENCOUNTER — Telehealth: Payer: Self-pay

## 2022-06-16 ENCOUNTER — Other Ambulatory Visit: Payer: Self-pay

## 2022-06-16 ENCOUNTER — Other Ambulatory Visit (HOSPITAL_COMMUNITY): Payer: Self-pay

## 2022-06-16 MED ORDER — HYDROCODONE-ACETAMINOPHEN 10-325 MG PO TABS
1.0000 | ORAL_TABLET | Freq: Three times a day (TID) | ORAL | 0 refills | Status: DC | PRN
  Filled 2022-06-16: qty 60, 20d supply, fill #0

## 2022-06-16 MED ORDER — TOPIRAMATE 25 MG PO TABS
25.0000 mg | ORAL_TABLET | Freq: Every day | ORAL | 0 refills | Status: DC
  Filled 2022-06-16: qty 30, 30d supply, fill #0

## 2022-06-16 NOTE — Telephone Encounter (Signed)
-----   Message from Drema Dallas, DO sent at 06/15/2022  4:31 PM EDT ----- Spinal tap shows that the spinal fluid pressure is normal, so headaches are not due to increased pressure in the head.  If headaches are stable, we can watch and wait.  If she has been having frequent or worsening headache, we start a daily medication to try and reduce frequency.  If she would like to start a medication, I would like to start TOPIRAMATE  AT BEDTIME.  Side effects may include numbness and tingling, so she shouldn't be alarmed if she experiences this, and symptoms will tend to dissipate as she gets used to the medication.  She should stay hydrated and take precautions not to get pregnant while on this medication.  If headaches do not improve in 30 days, she should contact us and we can increase dose.

## 2022-06-17 ENCOUNTER — Other Ambulatory Visit (HOSPITAL_COMMUNITY): Payer: Self-pay

## 2022-06-17 MED ORDER — GABAPENTIN 300 MG PO CAPS
300.0000 mg | ORAL_CAPSULE | Freq: Every evening | ORAL | 3 refills | Status: DC
  Filled 2022-06-17: qty 30, 30d supply, fill #0
  Filled 2022-07-13: qty 30, 30d supply, fill #1
  Filled 2022-08-15: qty 30, 30d supply, fill #2

## 2022-06-21 NOTE — Telephone Encounter (Signed)
Magda Paganini, please follow-up with patient the following: Clindamycin appears as historical medication and has not been prescribed by primary care. Please call patient to get more details about prescription request and/or schedule appointment.   Thank you

## 2022-06-21 NOTE — Telephone Encounter (Signed)
I called patient and she said do not worry about refill. Patient will contact her dermatologist office.

## 2022-06-23 ENCOUNTER — Other Ambulatory Visit (HOSPITAL_COMMUNITY): Payer: Self-pay

## 2022-06-23 MED ORDER — DOXYCYCLINE HYCLATE 100 MG PO TABS
100.0000 mg | ORAL_TABLET | Freq: Every day | ORAL | 1 refills | Status: DC
  Filled 2022-06-23: qty 30, 30d supply, fill #0
  Filled 2022-07-13 – 2022-08-09 (×3): qty 30, 30d supply, fill #1

## 2022-07-13 ENCOUNTER — Other Ambulatory Visit: Payer: Self-pay | Admitting: Neurology

## 2022-07-13 ENCOUNTER — Other Ambulatory Visit: Payer: Self-pay

## 2022-07-13 ENCOUNTER — Other Ambulatory Visit (HOSPITAL_COMMUNITY): Payer: Self-pay

## 2022-07-14 ENCOUNTER — Other Ambulatory Visit (HOSPITAL_COMMUNITY): Payer: Self-pay

## 2022-07-14 MED ORDER — HYDROCODONE-ACETAMINOPHEN 10-325 MG PO TABS
ORAL_TABLET | ORAL | 0 refills | Status: DC
  Filled 2022-07-14: qty 60, 20d supply, fill #0

## 2022-07-14 MED ORDER — TOPIRAMATE 25 MG PO TABS
25.0000 mg | ORAL_TABLET | Freq: Every day | ORAL | 5 refills | Status: DC
  Filled 2022-07-14: qty 30, 30d supply, fill #0

## 2022-07-31 ENCOUNTER — Other Ambulatory Visit: Payer: Self-pay | Admitting: Family

## 2022-08-01 ENCOUNTER — Other Ambulatory Visit (HOSPITAL_COMMUNITY): Payer: Self-pay

## 2022-08-01 NOTE — Telephone Encounter (Signed)
Call patient with update. Metformin appears as a historical medication that has never been prescribed by our office. Patient's last hemoglobin A1c 5.9% (prediabetic range) on 05/24/2022. Please get more details from patient and notify me. Thank you.

## 2022-08-02 NOTE — Telephone Encounter (Signed)
Call patient with update. Metformin appears as a historical medication that has never been prescribed by our office. Patient's last hemoglobin A1c 5.9% (prediabetic range) on 05/24/2022. Please get more details from patient and notify me. Thank you.

## 2022-08-09 ENCOUNTER — Other Ambulatory Visit (HOSPITAL_COMMUNITY): Payer: Self-pay

## 2022-08-15 ENCOUNTER — Other Ambulatory Visit: Payer: Self-pay

## 2022-08-15 ENCOUNTER — Other Ambulatory Visit (HOSPITAL_COMMUNITY): Payer: Self-pay

## 2022-08-15 MED ORDER — HYDROCODONE-ACETAMINOPHEN 10-325 MG PO TABS
1.0000 | ORAL_TABLET | Freq: Three times a day (TID) | ORAL | 0 refills | Status: DC | PRN
  Filled 2022-08-15: qty 60, 20d supply, fill #0

## 2022-08-19 NOTE — Progress Notes (Signed)
Patient ID: Heather Campos, female    DOB: Jul 03, 1975  MRN: [id number]  CC: Chronic Care Management   Subjective: Heather Campos is a 47 y.o. female who presents for chronic care management.   Her concerns today include:  Hypothyroid - Levothyroxine  Anxiety depression - Escitalopram  Neuro   Patient Active Problem List   Diagnosis Date Noted   Prediabetes 05/25/2022   Anxiety and depression 12/24/2021   Cerumen debris on tympanic membrane of left ear 09/30/2021   Hypothyroidism 08/20/2021   Chronic pain 08/17/2021   Lumbar spondylosis 06/29/2021   Bilateral carpal tunnel syndrome 06/23/2021   Pain in joint of left knee 02/04/2021   COVID-19 11/27/2020   Class 3 severe obesity due to excess calories without serious comorbidity with body mass index (BMI) of 40.0 to 44.9 in adult (HCC) 11/27/2020   Pain in joint of right shoulder 10/06/2020   Calcific tendinitis of right shoulder 10/06/2020   Low back pain 08/13/2020   Insomnia 04/14/2020   Pain of toe of right foot 02/24/2020   Stiffness of left shoulder joint 11/14/2019   Adhesive capsulitis of left shoulder 10/02/2019   Shoulder pain, left 09/03/2019   Calcific tendinitis of left shoulder 09/03/2019   Fibroid uterus 07/04/2019   Uterine fibroid 07/04/2019   Tachycardia 05/14/2019   History of COVID-19 05/13/2019   Hair loss 05/13/2019   OSA (obstructive sleep apnea) 02/09/2016   Family history of systemic lupus erythematosus 02/09/2016   Vitamin D deficiency 09/02/2015   Morbid obesity due to excess calories (HCC) 09/02/2015   Iron deficiency anemia due to chronic blood loss 09/02/2015   Hypothyroidism due to Hashimoto's thyroiditis 09/02/2015     Current Outpatient Medications on File Prior to Visit  Medication Sig Dispense Refill   Butalbital-APAP-Caffeine 50-300-40 MG CAPS Take 1 tablet by mouth every 6 (six) hours as needed. 40 capsule 0   clindamycin (CLEOCIN T) 1 % lotion Apply 1 application topically  daily.      diazepam (VALIUM) 5 MG tablet Take 1 tablet 30-40 minutes prior to procedure 1 tablet 0   doxycycline (VIBRA-TABS) 100 MG tablet Take 1 tablet (100 mg total) by mouth daily with food 30 tablet 1   gabapentin (NEURONTIN) 300 MG capsule Take 1 capsule (300 mg total) by mouth at bedtime. 30 capsule 3   HYDROcodone-acetaminophen (NORCO) 10-325 MG tablet Take 1 tablet by mouth every 8 (eight) hours as needed. 60 tablet 0   levothyroxine (SYNTHROID) 137 MCG tablet Take 1 tablet (137 mcg total) by mouth daily. 90 tablet 0   metFORMIN (GLUCOPHAGE) 500 MG tablet Take 500 mg by mouth 3 (three) times daily.     Multiple Vitamins-Minerals (MULTIVITAMIN) tablet Take 1 tablet by mouth daily. 90 tablet 0   omeprazole (PRILOSEC) 40 MG capsule Take 1 capsule (40 mg total) by mouth daily as needed. 90 capsule 0   topiramate (TOPAMAX) 25 MG tablet Take 1 tablet (25 mg total) by mouth daily. 30 tablet 5   Vitamin D, Ergocalciferol, (DRISDOL) 1.25 MG (50000 UNIT) CAPS capsule Take 1 capsule (50,000 Units total) by mouth every 7 (seven) days. 4 capsule 2   No current facility-administered medications on file prior to visit.    Allergies  Allergen Reactions   No Known Allergies     Social History   Socioeconomic History   Marital status: Married    Spouse name: Not on file   Number of children: Not on file   Years of education:  Not on file   Highest education level: Some college, no degree  Occupational History   Not on file  Tobacco Use   Smoking status: Former    Passive exposure: Past   Smokeless tobacco: Never  Vaping Use   Vaping Use: Never used  Substance and Sexual Activity   Alcohol use: No   Drug use: No   Sexual activity: Yes    Birth control/protection: None  Other Topics Concern   Not on file  Social History Narrative   Not on file   Social Determinants of Health   Financial Resource Strain: Low Risk  (05/23/2022)   Overall Financial Resource Strain (CARDIA)     Difficulty of Paying Living Expenses: Not hard at all  Food Insecurity: No Food Insecurity (05/23/2022)   Hunger Vital Sign    Worried About Running Out of Food in the Last Year: Never true    Ran Out of Food in the Last Year: Never true  Transportation Needs: No Transportation Needs (05/23/2022)   PRAPARE - Administrator, Civil Service (Medical): No    Lack of Transportation (Non-Medical): No  Physical Activity: Unknown (05/23/2022)   Exercise Vital Sign    Days of Exercise per Week: 0 days    Minutes of Exercise per Session: Not on file  Stress: Stress Concern Present (05/23/2022)   [employer] of Occupational Health - Occupational Stress Questionnaire    Feeling of Stress : To some extent  Social Connections: Socially Integrated (05/23/2022)   Social Connection and Isolation Panel [NHANES]    Frequency of Communication with Friends and Family: More than three times a week    Frequency of Social Gatherings with Friends and Family: Twice a week    Attends Religious Services: More than 4 times per year    Active Member of Clubs or Organizations: Yes    Attends Banker Meetings: 1 to 4 times per year    Marital Status: Married  Catering manager Violence: Not on file    Family History  Problem Relation Age of Onset   Lupus Mother    Kidney disease Mother    Lupus Cousin     Past Surgical History:  Procedure Laterality Date   ABDOMINAL HYSTERECTOMY     CESAREAN SECTION     CHOLECYSTECTOMY     CYSTOSCOPY  07/04/2019   Procedure: CYSTOSCOPY;  Surgeon: Essie Hart, MD;  Location: [facility];  Service: Gynecology;;   HYSTERECTOMY ABDOMINAL WITH SALPINGECTOMY Bilateral 07/04/2019   Procedure: ABDOMINAL  suprcervical hysterctomyWITH SALPINGECTOMY ;  Surgeon: Essie Hart, MD;  Location: [facility] [place];  Service: Gynecology;  Laterality: Bilateral;   TOTAL LAPAROSCOPIC HYSTERECTOMY WITH SALPINGECTOMY Bilateral 07/04/2019   Procedure:  attemted TOTAL LAPAROSCOPIC HYSTERECTOMY WITH SALPINGECTOMY;  Surgeon: Essie Hart, MD;  Location: [facility];  Service: Gynecology;  Laterality: Bilateral;  3 hours    ROS: Review of Systems Negative except as stated above  PHYSICAL EXAM: LMP 06/24/2019   Physical Exam  {female adult master:310786} {female adult master:310785}     Latest Ref Rng & Units 05/10/2022    8:27 AM 02/20/2022   11:06 AM 02/18/2022    4:42 PM  CMP  Glucose 70 - 99 mg/dL 161  096  045   BUN 6 - 20 mg/dL 7  6  8    Creatinine 0.44 - 1.00 mg/dL 4.09  8.11  9.14   Sodium 135 - 145 mmol/L 138  138  140  Potassium 3.5 - 5.1 mmol/L 3.7  3.8  3.5   Chloride 98 - 111 mmol/L 105  105  106   CO2 22 - 32 mmol/L 25  25  25    Calcium 8.9 - 10.3 mg/dL 9.2  8.9  9.1   Total Protein 6.5 - 8.1 g/dL 7.8     Total Bilirubin 0.3 - 1.2 mg/dL 0.4     Alkaline Phos 38 - 126 U/L 87     AST 15 - 41 U/L 28     ALT 0 - 44 U/L 28      Lipid Panel     Component Value Date/Time   CHOL 189 05/15/2020 1104   TRIG 73 05/15/2020 1104   HDL 60 05/15/2020 1104   CHOLHDL 3.2 05/15/2020 1104   LDLCALC 116 (H) 05/15/2020 1104    CBC    Component Value Date/Time   WBC 9.2 05/10/2022 0827   WBC 8.0 02/20/2022 1106   RBC 4.65 05/10/2022 0827   HGB 13.1 05/10/2022 0827   HGB 13.0 05/27/2019 0850   HCT 37.5 05/10/2022 0827   HCT 39.9 05/27/2019 0850   PLT 275 05/10/2022 0827   PLT 343 05/27/2019 0850   MCV 80.6 05/10/2022 0827   MCV 80 05/27/2019 0850   MCH 28.2 05/10/2022 0827   MCHC 34.9 05/10/2022 0827   RDW 13.1 05/10/2022 0827   RDW 18.2 (H) 05/27/2019 0850   LYMPHSABS 3.3 05/10/2022 0827   LYMPHSABS 2.8 05/27/2019 0850   MONOABS 0.7 05/10/2022 0827   EOSABS 0.2 05/10/2022 0827   EOSABS 0.2 05/27/2019 0850   BASOSABS 0.0 05/10/2022 0827   BASOSABS 0.0 05/27/2019 0850    ASSESSMENT AND PLAN:  There are no diagnoses linked to this encounter.   Patient was given the opportunity to ask  questions.  Patient verbalized understanding of the plan and was able to repeat key elements of the plan. Patient was given clear instructions to go to Emergency Department or return to medical center if symptoms don't improve, worsen, or new problems develop.The patient verbalized understanding.   No orders of the defined types were placed in this encounter.    Requested Prescriptions    No prescriptions requested or ordered in this encounter    No follow-ups on file.  Rema Fendt, NP

## 2022-08-23 ENCOUNTER — Ambulatory Visit (INDEPENDENT_AMBULATORY_CARE_PROVIDER_SITE_OTHER): Payer: No Typology Code available for payment source | Admitting: Family

## 2022-08-23 ENCOUNTER — Other Ambulatory Visit (HOSPITAL_COMMUNITY): Payer: Self-pay

## 2022-08-23 ENCOUNTER — Encounter: Payer: Self-pay | Admitting: Family

## 2022-08-23 VITALS — BP 114/83 | HR 76 | Temp 98.3°F | Ht 64.0 in | Wt 270.2 lb

## 2022-08-23 DIAGNOSIS — F419 Anxiety disorder, unspecified: Secondary | ICD-10-CM | POA: Diagnosis not present

## 2022-08-23 DIAGNOSIS — F32A Depression, unspecified: Secondary | ICD-10-CM | POA: Diagnosis not present

## 2022-08-23 DIAGNOSIS — E039 Hypothyroidism, unspecified: Secondary | ICD-10-CM

## 2022-08-23 MED ORDER — ESCITALOPRAM OXALATE 10 MG PO TABS
10.0000 mg | ORAL_TABLET | Freq: Every day | ORAL | 0 refills | Status: DC
Start: 2022-08-23 — End: 2022-12-13
  Filled 2022-08-23: qty 30, 30d supply, fill #0

## 2022-08-23 MED ORDER — LEVOTHYROXINE SODIUM 137 MCG PO TABS
137.0000 ug | ORAL_TABLET | Freq: Every day | ORAL | 0 refills | Status: DC
Start: 2022-08-23 — End: 2022-12-13
  Filled 2022-08-23 – 2022-09-12 (×3): qty 30, 30d supply, fill #0
  Filled 2022-10-04 – 2022-10-22 (×2): qty 30, 30d supply, fill #1
  Filled 2022-11-19: qty 30, 30d supply, fill #2

## 2022-09-01 ENCOUNTER — Other Ambulatory Visit (HOSPITAL_COMMUNITY): Payer: Self-pay

## 2022-09-06 ENCOUNTER — Other Ambulatory Visit: Payer: Self-pay | Admitting: Family

## 2022-09-06 DIAGNOSIS — E559 Vitamin D deficiency, unspecified: Secondary | ICD-10-CM

## 2022-09-07 ENCOUNTER — Ambulatory Visit (INDEPENDENT_AMBULATORY_CARE_PROVIDER_SITE_OTHER): Payer: No Typology Code available for payment source

## 2022-09-07 ENCOUNTER — Ambulatory Visit
Admission: EM | Admit: 2022-09-07 | Discharge: 2022-09-07 | Disposition: A | Discharge status: Home / Self Care | Payer: No Typology Code available for payment source | Attending: Internal Medicine | Admitting: Internal Medicine

## 2022-09-07 ENCOUNTER — Ambulatory Visit: Payer: Self-pay | Admitting: *Deleted

## 2022-09-07 ENCOUNTER — Encounter: Payer: Self-pay | Admitting: *Deleted

## 2022-09-07 ENCOUNTER — Other Ambulatory Visit (HOSPITAL_COMMUNITY): Payer: Self-pay

## 2022-09-07 DIAGNOSIS — R0781 Pleurodynia: Secondary | ICD-10-CM

## 2022-09-07 MED ORDER — METHOCARBAMOL 500 MG PO TABS
500.0000 mg | ORAL_TABLET | Freq: Two times a day (BID) | ORAL | 0 refills | Status: DC | PRN
  Filled 2022-09-07: qty 20, 10d supply, fill #0

## 2022-09-07 NOTE — Telephone Encounter (Signed)
Reason for Disposition  [1] Chest pain lasts > 5 minutes AND [2] occurred in past 3 days (72 hours) (Exception: Feels exactly the same as previously diagnosed heartburn and has accompanying sour taste in mouth.)  Answer Assessment - Initial Assessment Questions 1. LOCATION: "Where does it hurt?"       R side under ribs/lungs 2. RADIATION: "Does the pain go anywhere else?" (e.g., into neck, jaw, arms, back)     No referred pain 3. ONSET: "When did the chest pain begin?" (Minutes, hours or days)      Started yesterday 4. PATTERN: "Does the pain come and go, or has it been constant since it started?"  "Does it get worse with exertion?"      Constant, started yesterday afternoon, hard to position, worse with movement 5. DURATION: "How long does it last" (e.g., seconds, minutes, hours)     hours 6. SEVERITY: "How bad is the pain?"  (e.g., Scale 1-10; mild, moderate, or severe)    - MILD (1-3): doesn't interfere with normal activities     - MODERATE (4-7): interferes with normal activities or awakens from sleep    - SEVERE (8-10): excruciating pain, unable to do any normal activities       Severe-8 7. CARDIAC RISK FACTORS: "Do you have any history of heart problems or risk factors for heart disease?" (e.g., angina, prior heart attack; diabetes, high blood pressure, high cholesterol, smoker, or strong family history of heart disease)     no 8. PULMONARY RISK FACTORS: "Do you have any history of lung disease?"  (e.g., blood clots in lung, asthma, emphysema, birth control pills)     no 9. CAUSE: "What do you think is causing the chest pain?"     unsure 10. OTHER SYMPTOMS: "Do you have any other symptoms?" (e.g., dizziness, nausea, vomiting, sweating, fever, difficulty breathing, cough)       no 11. PREGNANCY: "Is there any chance you are pregnant?" "When was your last menstrual period?"       no  Protocols used: Chest Pain-A-AH

## 2022-09-07 NOTE — ED Provider Notes (Signed)
[facility]    CSN: [id number] Arrival date & time: 09/07/22  0851      History   Chief Complaint No chief complaint on file.   HPI Heather Campos is a 47 y.o. female.   Patient presents with right-sided rib pain that started yesterday.  Denies any obvious injury to the area but reports that she was moving yesterday so was doing a lot of pushing, pulling, lifting.  Reports pain with inspiration.  Patient not reporting any recent coughing or fever.  Has applied heat and taken Tylenol with no improvement.  Denies shortness of breath.     Past Medical History:  Diagnosis Date   Allergic rhinitis    Anxiety    Chronic back pain    Family history of systemic lupus erythematosus    GERD (gastroesophageal reflux disease)    Hypothyroidism due to Hashimoto's thyroiditis    IDA (iron deficiency anemia)    Morbid obesity (HCC)    OSA (obstructive sleep apnea)    No CPAP   Vitamin D deficiency     Patient Active Problem List   Diagnosis Date Noted   Prediabetes 05/25/2022   Anxiety and depression 12/24/2021   Cerumen debris on tympanic membrane of left ear 09/30/2021   Hypothyroidism 08/20/2021   Chronic pain 08/17/2021   Lumbar spondylosis 06/29/2021   Bilateral carpal tunnel syndrome 06/23/2021   Pain in joint of left knee 02/04/2021   COVID-19 11/27/2020   Class 3 severe obesity due to excess calories without serious comorbidity with body mass index (BMI) of 40.0 to 44.9 in adult (HCC) 11/27/2020   Pain in joint of right shoulder 10/06/2020   Calcific tendinitis of right shoulder 10/06/2020   Low back pain 08/13/2020   Insomnia 04/14/2020   Pain of toe of right foot 02/24/2020   Stiffness of left shoulder joint 11/14/2019   Adhesive capsulitis of left shoulder 10/02/2019   Shoulder pain, left 09/03/2019   Calcific tendinitis of left shoulder 09/03/2019   Fibroid uterus 07/04/2019   Uterine fibroid 07/04/2019   Tachycardia 05/14/2019   History of  COVID-19 05/13/2019   Hair loss 05/13/2019   OSA (obstructive sleep apnea) 02/09/2016   Family history of systemic lupus erythematosus 02/09/2016   Vitamin D deficiency 09/02/2015   Morbid obesity due to excess calories (HCC) 09/02/2015   Iron deficiency anemia due to chronic blood loss 09/02/2015   Hypothyroidism due to Hashimoto's thyroiditis 09/02/2015    Past Surgical History:  Procedure Laterality Date   ABDOMINAL HYSTERECTOMY     CESAREAN SECTION     CHOLECYSTECTOMY     CYSTOSCOPY  07/04/2019   Procedure: CYSTOSCOPY;  Surgeon: Essie Hart, MD;  Location: [facility];  Service: Gynecology;;   HYSTERECTOMY ABDOMINAL WITH SALPINGECTOMY Bilateral 07/04/2019   Procedure: ABDOMINAL  suprcervical hysterctomyWITH SALPINGECTOMY ;  Surgeon: Essie Hart, MD;  Location: [facility];  Service: Gynecology;  Laterality: Bilateral;   TOTAL LAPAROSCOPIC HYSTERECTOMY WITH SALPINGECTOMY Bilateral 07/04/2019   Procedure: attemted TOTAL LAPAROSCOPIC HYSTERECTOMY WITH SALPINGECTOMY;  Surgeon: Essie Hart, MD;  Location: [facility];  Service: Gynecology;  Laterality: Bilateral;  3 hours    OB History   No obstetric history on file.      Home Medications    Prior to Admission medications   Medication Sig Start Date End Date Taking? Authorizing Provider  clindamycin (CLEOCIN T) 1 % lotion Apply 1 application topically daily.  06/03/19  Yes [provider]  doxycycline (  VIBRA-TABS) 100 MG tablet Take 1 tablet (100 mg total) by mouth daily with food 06/23/22  Yes   levothyroxine (SYNTHROID) 137 MCG tablet Take 1 tablet (137 mcg total) by mouth daily. 08/23/22 11/21/22 Yes Zonia Kief, Amy J, NP  metFORMIN (GLUCOPHAGE) 500 MG tablet Take 500 mg by mouth 3 (three) times daily. 12/08/21  Yes [provider]  methocarbamol (ROBAXIN) 500 MG tablet Take 1 tablet (500 mg total) by mouth 2 (two) times daily as needed for muscle spasms. 09/07/22  Yes Keona Bilyeu,  Acie Fredrickson, FNP  Multiple Vitamins-Minerals (MULTIVITAMIN) tablet Take 1 tablet by mouth daily. 05/24/22  Yes Zonia Kief, Amy J, NP  Vitamin D, Ergocalciferol, (DRISDOL) 1.25 MG (50000 UNIT) CAPS capsule Take 1 capsule (50,000 Units total) by mouth every 7 (seven) days. 05/25/22  Yes Zonia Kief, Amy J, NP  Butalbital-APAP-Caffeine 50-300-40 MG CAPS Take 1 tablet by mouth every 6 (six) hours as needed. Patient not taking: Reported on 08/23/2022 02/22/22   Claiborne Rigg, NP  diazepam (VALIUM) 5 MG tablet Take 1 tablet 30-40 minutes prior to procedure 06/07/22   Drema Dallas, DO  escitalopram (LEXAPRO) 10 MG tablet Take 1 tablet (10 mg total) by mouth daily. 08/23/22 11/21/22  Rema Fendt, NP  gabapentin (NEURONTIN) 300 MG capsule Take 1 capsule (300 mg total) by mouth at bedtime. 06/17/22     HYDROcodone-acetaminophen (NORCO) 10-325 MG tablet Take 1 tablet by mouth every 8 (eight) hours as needed. 08/15/22     omeprazole (PRILOSEC) 40 MG capsule Take 1 capsule (40 mg total) by mouth daily as needed. 11/17/21 08/23/22  Rema Fendt, NP  topiramate (TOPAMAX) 25 MG tablet Take 1 tablet (25 mg total) by mouth daily. 07/14/22   Drema Dallas, DO    Family History Family History  Problem Relation Age of Onset   Lupus Mother    Kidney disease Mother    Lupus Cousin     Social History Social History   Tobacco Use   Smoking status: Former    Passive exposure: Past   Smokeless tobacco: Never  Vaping Use   Vaping status: Never Used  Substance Use Topics   Alcohol use: No   Drug use: No     Allergies   No known allergies   Review of Systems Review of Systems Per HPI  Physical Exam Triage Vital Signs ED Triage Vitals  Encounter Vitals Group     BP 09/07/22 0903 (!) 130/93     Systolic BP Percentile --      Diastolic BP Percentile --      Pulse Rate 09/07/22 0903 86     Resp 09/07/22 0903 20     Temp 09/07/22 0903 98.8 F (37.1 C)     Temp Source 09/07/22 0903 Oral     SpO2 09/07/22 0903 98  %     Weight --      Height --      Head Circumference --      Peak Flow --      Pain Score 09/07/22 0905 10     Pain Loc --      Pain Education --      Exclude from Growth Chart --    No data found.  Updated Vital Signs BP (!) 130/93   Pulse 86   Temp 98.8 F (37.1 C) (Oral)   Resp 20   LMP 06/24/2019   SpO2 98%   Visual Acuity Right Eye Distance:   Left Eye Distance:  Bilateral Distance:    Right Eye Near:   Left Eye Near:    Bilateral Near:     Physical Exam Constitutional:      General: She is not in acute distress.    Appearance: Normal appearance. She is not toxic-appearing or diaphoretic.  HENT:     Head: Normocephalic and atraumatic.  Eyes:     Extraocular Movements: Extraocular movements intact.     Conjunctiva/sclera: Conjunctivae normal.  Cardiovascular:     Rate and Rhythm: Normal rate and regular rhythm.     Pulses: Normal pulses.     Heart sounds: Normal heart sounds.  Pulmonary:     Effort: Pulmonary effort is normal. No respiratory distress.     Breath sounds: Normal breath sounds. No stridor. No wheezing, rhonchi or rales.  Chest:     Comments: Patient has tenderness to palpation to right lateral lower ribs.  There is no discoloration, swelling, crepitus, lacerations, abrasions noted. Neurological:     General: No focal deficit present.     Mental Status: She is alert and oriented to person, place, and time. Mental status is at baseline.  Psychiatric:        Mood and Affect: Mood normal.        Behavior: Behavior normal.        Thought Content: Thought content normal.        Judgment: Judgment normal.      UC Treatments / Results  Labs (all labs ordered are listed, but only abnormal results are displayed) Labs Reviewed - No data to display  EKG   Radiology DG Ribs Unilateral W/Chest Right  Result Date: 09/07/2022 CLINICAL DATA:  Rib pain. Complains of sudden onset pain within ribs while moving boxes. History of MGUS EXAM: RIGHT  RIBS AND CHEST - 3+ VIEW COMPARISON:  Bone survey 05/04/2020 FINDINGS: No fracture or other bone lesions are seen involving the ribs. There is no evidence of pneumothorax or pleural effusion. Both lungs are clear. Heart size and mediastinal contours are within normal limits. Surgical clips noted in the right upper quadrant of the abdomen. IMPRESSION: Negative. Electronically Signed   By: Signa Kell M.D.   On: 09/07/2022 10:13    Procedures Procedures (including critical care time)  Medications Ordered in UC Medications - No data to display  Initial Impression / Assessment and Plan / UC Course  I have reviewed the triage vital signs and the nursing notes.  Pertinent labs & imaging results that were available during my care of the patient were reviewed by me and considered in my medical decision making (see chart for details).     X-ray of ribs was negative for any acute bony abnormality.  Suspect most likely muscular strain of this area given patient was doing a lot of pushing, pulling, lifting yesterday.  Advised patient to apply ice.  Will prescribe muscle relaxer for patient to take as needed.  Patient does take hydrocodone but encouraged her to use these medications separately.  Reminded patient that muscle relaxer can make her drowsy and do not drive or drink alcohol with taking it.  Advised following up with her established orthopedist for further evaluation and management if symptoms persist or worsen.  Advised deep breathing techniques.  Patient verbalized understanding and was agreeable with plan. Final Clinical Impressions(s) / UC Diagnoses   Final diagnoses:  Rib pain on right side     Discharge Instructions      Your rib x-ray was normal.  I have  prescribed you a muscle relaxer to take as needed.  Please be advised that it can make you drowsy.  Take separately from your hydrocodone.  Do not drive or drive while taking it.  Apply ice to area.  Follow-up if any symptoms persist  or worsen.    ED Prescriptions     Medication Sig Dispense Auth. Provider   methocarbamol (ROBAXIN) 500 MG tablet Take 1 tablet (500 mg total) by mouth 2 (two) times daily as needed for muscle spasms. 20 tablet [place], Acie Fredrickson, [place]      PDMP not reviewed this encounter.   Gustavus Bryant, [place] 09/07/22 1034

## 2022-09-07 NOTE — Telephone Encounter (Signed)
Report to Emergency Department/Urgent Care for immediate medical evaluation. Follow-up with Primary Care.

## 2022-09-07 NOTE — ED Triage Notes (Signed)
C/O sudden onset right proximal side pain (points to area of right lateral ribs) onset yesterday; states has been moving boxes lately. States called PCP but they had no appts available. Site is very tender to touch, more painful with movement and deep breathing. Has taken Tyl without relief.

## 2022-09-07 NOTE — Discharge Instructions (Signed)
Your rib x-ray was normal.  I have prescribed you a muscle relaxer to take as needed.  Please be advised that it can make you drowsy.  Take separately from your hydrocodone.  Do not drive or drive while taking it.  Apply ice to area.  Follow-up if any symptoms persist or worsen.

## 2022-09-07 NOTE — Telephone Encounter (Signed)
  Chief Complaint: side/rib pain- chest pain Symptoms: R sided pain- under ribs- R side Frequency: started yesterday Pertinent Negatives: Patient denies dizziness, nausea, vomiting, sweating, fever, difficulty breathing, cough Disposition: [] ED /[x] Urgent Care (no appt availability in office) / [] Appointment(In office/virtual)/ []  [facility]/ [] Home Care/ [] Refused Recommended Disposition /[] Accident Mobile Bus/ []  Follow-up with PCP Additional Notes: Call to office advised UC next door- no open appointment

## 2022-09-08 NOTE — Telephone Encounter (Signed)
Called Pt and she reported to Urgent Care next door.

## 2022-09-09 ENCOUNTER — Emergency Department (HOSPITAL_COMMUNITY): Payer: No Typology Code available for payment source

## 2022-09-09 ENCOUNTER — Emergency Department (HOSPITAL_COMMUNITY)
Admission: EM | Admit: 2022-09-09 | Discharge: 2022-09-09 | Discharge status: Against Medical Advice | Payer: No Typology Code available for payment source | Attending: Emergency Medicine | Admitting: Emergency Medicine

## 2022-09-09 ENCOUNTER — Encounter (HOSPITAL_COMMUNITY): Payer: Self-pay | Admitting: *Deleted

## 2022-09-09 ENCOUNTER — Other Ambulatory Visit: Payer: Self-pay

## 2022-09-09 ENCOUNTER — Emergency Department (HOSPITAL_COMMUNITY)
Admission: EM | Admit: 2022-09-09 | Discharge: 2022-09-09 | Disposition: A | Discharge status: Home / Self Care | Payer: No Typology Code available for payment source | Source: Home / Self Care | Attending: Emergency Medicine | Admitting: Emergency Medicine

## 2022-09-09 DIAGNOSIS — R52 Pain, unspecified: Secondary | ICD-10-CM | POA: Insufficient documentation

## 2022-09-09 DIAGNOSIS — Z5321 Procedure and treatment not carried out due to patient leaving prior to being seen by health care provider: Secondary | ICD-10-CM | POA: Insufficient documentation

## 2022-09-09 DIAGNOSIS — M5489 Other dorsalgia: Secondary | ICD-10-CM | POA: Insufficient documentation

## 2022-09-09 DIAGNOSIS — R109 Unspecified abdominal pain: Secondary | ICD-10-CM | POA: Insufficient documentation

## 2022-09-09 DIAGNOSIS — M549 Dorsalgia, unspecified: Secondary | ICD-10-CM

## 2022-09-09 LAB — CBC WITH DIFFERENTIAL/PLATELET
Abs Immature Granulocytes: 0.03 10*3/uL (ref 0.00–0.07)
Basophils Absolute: 0 10*3/uL (ref 0.0–0.1)
Basophils Relative: 0 %
Eosinophils Absolute: 0.1 10*3/uL (ref 0.0–0.5)
Eosinophils Relative: 2 %
HCT: 38.4 % (ref 36.0–46.0)
Hemoglobin: 12.9 g/dL (ref 12.0–15.0)
Immature Granulocytes: 0 %
Lymphocytes Relative: 47 %
Lymphs Abs: 3.9 10*3/uL (ref 0.7–4.0)
MCH: 28.4 pg (ref 26.0–34.0)
MCHC: 33.6 g/dL (ref 30.0–36.0)
MCV: 84.4 fL (ref 80.0–100.0)
Monocytes Absolute: 0.6 10*3/uL (ref 0.1–1.0)
Monocytes Relative: 7 %
Neutro Abs: 3.8 10*3/uL (ref 1.7–7.7)
Neutrophils Relative %: 44 %
Platelets: 238 10*3/uL (ref 150–400)
RBC: 4.55 MIL/uL (ref 3.87–5.11)
RDW: 13.6 % (ref 11.5–15.5)
WBC: 8.4 10*3/uL (ref 4.0–10.5)
nRBC: 0 % (ref 0.0–0.2)

## 2022-09-09 LAB — URINALYSIS, ROUTINE W REFLEX MICROSCOPIC
Bilirubin Urine: NEGATIVE
Glucose, UA: NEGATIVE mg/dL
Hgb urine dipstick: NEGATIVE
Ketones, ur: NEGATIVE mg/dL
Leukocytes,Ua: NEGATIVE
Nitrite: NEGATIVE
Protein, ur: NEGATIVE mg/dL
Specific Gravity, Urine: 1.011 (ref 1.005–1.030)
pH: 5 (ref 5.0–8.0)

## 2022-09-09 LAB — COMPREHENSIVE METABOLIC PANEL
ALT: 22 U/L (ref 0–44)
AST: 20 U/L (ref 15–41)
Albumin: 3.7 g/dL (ref 3.5–5.0)
Alkaline Phosphatase: 65 U/L (ref 38–126)
Anion gap: 8 (ref 5–15)
BUN: 7 mg/dL (ref 6–20)
CO2: 23 mmol/L (ref 22–32)
Calcium: 8.8 mg/dL — ABNORMAL LOW (ref 8.9–10.3)
Chloride: 106 mmol/L (ref 98–111)
Creatinine, Ser: 0.84 mg/dL (ref 0.44–1.00)
GFR, Estimated: >60 mL/min (ref >60)
Glucose, Bld: 103 mg/dL — ABNORMAL HIGH (ref 70–99)
Potassium: 3.9 mmol/L (ref 3.5–5.1)
Sodium: 137 mmol/L (ref 135–145)
Total Bilirubin: 0.7 mg/dL (ref 0.3–1.2)
Total Protein: 7.3 g/dL (ref 6.5–8.1)

## 2022-09-09 LAB — LIPASE, BLOOD: Lipase: 32 U/L (ref 11–51)

## 2022-09-09 MED ORDER — OXYCODONE-ACETAMINOPHEN 5-325 MG PO TABS
2.0000 | ORAL_TABLET | Freq: Once | ORAL | Status: AC
  Administered 2022-09-09: 2 via ORAL

## 2022-09-09 NOTE — ED Triage Notes (Signed)
Here with family from home for L side and flank pain. Onset Tuesday. Rates 8/10, constant. Pinpoints to R lateral side, now moving to R flank. Denies urinary or other sx. Seen at UC, xray negative. No relief with norco, robaxin or tylenol. Alert, NAD, calm.

## 2022-09-09 NOTE — ED Notes (Signed)
Pt is now waiting in [facility] lobby

## 2022-09-09 NOTE — ED Notes (Signed)
Called ct to verify we were not waiting due to IV.  IV not needed for renal study

## 2022-09-09 NOTE — ED Provider Notes (Signed)
[place] EMERGENCY DEPARTMENT AT [facility] Provider Note   CSN: [id number] Arrival date & time: 09/09/22  6578     History  Chief Complaint  Patient presents with   Abdominal Pain    Heather Campos is a 47 y.o. female.  HPI   47 yo female with right flank and mid back pain for 3 days.  Symptoms began Wednesday.  She was seen at [facility] and took muscle relaxant without relief.  Pain is constant and worse with movement.  Home Medications Prior to Admission medications   Medication Sig Start Date End Date Taking? Authorizing Provider  Butalbital-APAP-Caffeine 50-300-40 MG CAPS Take 1 tablet by mouth every 6 (six) hours as needed. Patient not taking: Reported on 08/23/2022 02/22/22   Claiborne Rigg, NP  clindamycin (CLEOCIN T) 1 % lotion Apply 1 application topically daily.  06/03/19   [provider]  diazepam (VALIUM) 5 MG tablet Take 1 tablet 30-40 minutes prior to procedure 06/07/22   Drema Dallas, DO  doxycycline (VIBRA-TABS) 100 MG tablet Take 1 tablet (100 mg total) by mouth daily with food 06/23/22     escitalopram (LEXAPRO) 10 MG tablet Take 1 tablet (10 mg total) by mouth daily. 08/23/22 11/21/22  Rema Fendt, NP  gabapentin (NEURONTIN) 300 MG capsule Take 1 capsule (300 mg total) by mouth at bedtime. 06/17/22     HYDROcodone-acetaminophen (NORCO) 10-325 MG tablet Take 1 tablet by mouth every 8 (eight) hours as needed. 08/15/22     levothyroxine (SYNTHROID) 137 MCG tablet Take 1 tablet (137 mcg total) by mouth daily. 08/23/22 11/21/22  Rema Fendt, NP  metFORMIN (GLUCOPHAGE) 500 MG tablet Take 500 mg by mouth 3 (three) times daily. 12/08/21   [provider]  methocarbamol (ROBAXIN) 500 MG tablet Take 1 tablet (500 mg total) by mouth 2 (two) times daily as needed for muscle spasms. 09/07/22   Gustavus Bryant, FNP  Multiple Vitamins-Minerals (MULTIVITAMIN) tablet Take 1 tablet by mouth daily. 05/24/22   Rema Fendt, NP  omeprazole (PRILOSEC) 40 MG  capsule Take 1 capsule (40 mg total) by mouth daily as needed. 11/17/21 08/23/22  Rema Fendt, NP  topiramate (TOPAMAX) 25 MG tablet Take 1 tablet (25 mg total) by mouth daily. 07/14/22   Drema Dallas, DO  Vitamin D, Ergocalciferol, (DRISDOL) 1.25 MG (50000 UNIT) CAPS capsule Take 1 capsule (50,000 Units total) by mouth every 7 (seven) days. 05/25/22   Rema Fendt, NP      Allergies    No known allergies    Review of Systems   Review of Systems  Physical Exam Updated Vital Signs BP (!) 140/90   Pulse 71   Temp 98.6 F (37 C) (Oral)   Resp 16   Wt 122.5 kg   LMP 06/24/2019   SpO2 99%   BMI 46.35 kg/m  Physical Exam Vitals and nursing note reviewed.  Constitutional:      General: She is not in acute distress.    Appearance: She is well-developed. She is obese.  HENT:     Head: Normocephalic and atraumatic.     Right Ear: External ear normal.     Left Ear: External ear normal.     Nose: Nose normal.     Mouth/Throat:     Mouth: Mucous membranes are moist.  Eyes:     Conjunctiva/sclera: Conjunctivae normal.     Pupils: Pupils are equal, round, and reactive to light.  Pulmonary:  Effort: Pulmonary effort is normal.  Abdominal:     General: Abdomen is flat. Bowel sounds are normal.     Palpations: Abdomen is soft.     Tenderness: There is no abdominal tenderness. There is right CVA tenderness.  Musculoskeletal:        General: Normal range of motion.     Cervical back: Normal range of motion and neck supple.  Skin:    General: Skin is warm and dry.     Comments: Back with diffuse ttp right midback.no redness or swelling or rash noted  Neurological:     Mental Status: She is alert and oriented to person, place, and time.     Motor: No abnormal muscle tone.     Coordination: Coordination normal.  Psychiatric:        Behavior: Behavior normal.        Thought Content: Thought content normal.     ED Results / Procedures / Treatments   Labs (all labs ordered  are listed, but only abnormal results are displayed) Labs Reviewed  COMPREHENSIVE METABOLIC PANEL - Abnormal; Notable for the following components:      Result Value   Glucose, Bld 103 (*)    Calcium 8.8 (*)    All other components within normal limits  LIPASE, BLOOD  URINALYSIS, ROUTINE W REFLEX MICROSCOPIC  CBC WITH DIFFERENTIAL/PLATELET    EKG None  Radiology CT Renal Stone Study  Result Date: 09/09/2022 CLINICAL DATA:  Abdominal and flank pain EXAM: CT ABDOMEN AND PELVIS WITHOUT CONTRAST TECHNIQUE: Multidetector CT imaging of the abdomen and pelvis was performed following the standard protocol without IV contrast. RADIATION DOSE REDUCTION: This exam was performed according to the departmental dose-optimization program which includes automated exposure control, adjustment of the mA and/or kV according to patient size and/or use of iterative reconstruction technique. COMPARISON:  CT abdomen and pelvis 11/01/2019 FINDINGS: Lower chest: There some stable clustered tree-in-bud opacities in the right lower lobe with some calcifications. Hepatobiliary: No focal liver abnormality is seen. Status post cholecystectomy. No biliary dilatation. Pancreas: Unremarkable. No pancreatic ductal dilatation or surrounding inflammatory changes. Spleen: Normal in size without focal abnormality. Adrenals/Urinary Tract: Adrenal glands are unremarkable. Kidneys are normal, without renal calculi, focal lesion, or hydronephrosis. Bladder is unremarkable. Stomach/Bowel: Stomach is within normal limits. Appendix appears normal. No evidence of bowel wall thickening, distention, or inflammatory changes. Vascular/Lymphatic: No significant vascular findings are present. No enlarged abdominal or pelvic lymph nodes. Reproductive: Prostate is unremarkable. Other: No abdominal wall hernia or abnormality. No abdominopelvic ascites. Musculoskeletal: No acute or significant osseous findings. IMPRESSION: 1. No acute findings in the  abdomen or pelvis. 2. Stable clustered tree-in-bud opacities in the right lower lobe with some calcifications, likely sequela of prior infectious/inflammatory process. Electronically Signed   By: Darliss Cheney M.D.   On: 09/09/2022 16:17    Procedures Procedures    Medications Ordered in ED Medications  oxyCODONE-acetaminophen (PERCOCET/ROXICET) 5-325 MG per tablet 2 tablet (2 tablets Oral Given 09/09/22 1415)    ED Course/ Medical Decision Making/ A&P Clinical Course as of 09/09/22 1624  Fri Sep 09, 2022  1624 CBC reviewed interpreted within normal limits complete metabolic panel reviewed interpreted within normal limits urinalysis without any evidence of infection or hematuria [DR]    Clinical Course User Index [DR] Margarita Grizzle, MD                             Medical Decision  Making Amount and/or Complexity of Data Reviewed Labs: ordered. Radiology: ordered.  Risk Prescription drug management.   47 year old female presents today with right sided mid back pain.  Physical exam most consistent with musculoskeletal.  Patient has been treated with skeletal muscle relaxants and continues to have pain.  Secondary this CT was obtained.  CT unremarkable without acute findings close stable clustered tree-in-bud opacities in right lower lobe with some calcifications which is thought to be sequela of prior infectious/inflammatory process. Based on patient's recent lifting sit suspect that this is musculoskeletal in etiology Will continue anti-inflammatories and muscle relaxants Discussed return precautions need for follow-up patient voiced understanding.        Final Clinical Impression(s) / ED Diagnoses Final diagnoses:  Mid back pain    Rx / DC Orders ED Discharge Orders     None         Margarita Grizzle, MD 09/09/22 1624

## 2022-09-09 NOTE — Discharge Instructions (Addendum)
Please use ibuprofen 800 mg 3 times a day for the next 4 days. You may continue the skeletal muscle relaxants these oftentimes takes several doses to help. Please take plenty of fluids. Your labs and imaging studies were stable here.  Please return if you are having new or worsening symptoms.

## 2022-09-09 NOTE — ED Triage Notes (Signed)
Pt arrived POV and reports she has been having right side pain since Tuesday. Went and seen urgent care MD and was told it was muscular. Patient states pain has been constant with no relief from OTC meds. Denies known injury, N/V/D, any urinary changes or any other symptoms. AAOX4, NAD. Resp even and unlabored

## 2022-09-12 ENCOUNTER — Other Ambulatory Visit (HOSPITAL_COMMUNITY): Payer: Self-pay

## 2022-09-12 ENCOUNTER — Other Ambulatory Visit: Payer: Self-pay

## 2022-09-12 MED ORDER — LIDOCAINE 5 % EX PTCH
MEDICATED_PATCH | CUTANEOUS | 0 refills | Status: DC
  Filled 2022-09-12 (×2): qty 30, 30d supply, fill #0

## 2022-09-12 MED ORDER — MELOXICAM 15 MG PO TABS
15.0000 mg | ORAL_TABLET | Freq: Every day | ORAL | 0 refills | Status: DC | PRN
  Filled 2022-09-12: qty 15, 15d supply, fill #0

## 2022-09-13 ENCOUNTER — Other Ambulatory Visit (HOSPITAL_COMMUNITY): Payer: Self-pay

## 2022-09-16 ENCOUNTER — Telehealth (INDEPENDENT_AMBULATORY_CARE_PROVIDER_SITE_OTHER): Payer: No Typology Code available for payment source | Admitting: Family

## 2022-09-16 ENCOUNTER — Other Ambulatory Visit (HOSPITAL_COMMUNITY): Payer: Self-pay

## 2022-09-16 DIAGNOSIS — M549 Dorsalgia, unspecified: Secondary | ICD-10-CM

## 2022-09-16 NOTE — Progress Notes (Signed)
Virtual Visit via Video Note  I connected with Heather Campos, on 09/16/2022 at 1:11 PM by video and verified that I am speaking with the correct person using two identifiers.  Consent: I discussed the limitations, risks, security and privacy concerns of performing an evaluation and management service by video and the availability of in person appointments. I also discussed with the patient that there may be a patient responsible charge related to this service. The patient expressed understanding and agreed to proceed.   Location of Patient: Home  Location of Provider: [facility]   Persons participating in Telemedicine visit: Aribelle R Carylon Perches, NP Curley Spice, CMA  History of Present Illness: Heather Campos is a 47 y.o. female who presents for Emergency Department follow-up.   09/09/2022 [place] Emergency Department at [facility] per MD note: ED Course/ Medical Decision Making/ A&P    Clinical Course as of 09/09/22 1624  Fri Sep 09, 2022  1624 CBC reviewed interpreted within normal limits complete metabolic panel reviewed interpreted within normal limits urinalysis without any evidence of infection or hematuria [DR]     Clinical Course User Index [DR] Margarita Grizzle, MD                              Medical Decision Making Amount and/or Complexity of Data Reviewed Labs: ordered. Radiology: ordered.   Risk Prescription drug management.     47 year old female presents today with right sided mid back pain.  Physical exam most consistent with musculoskeletal.  Patient has been treated with skeletal muscle relaxants and continues to have pain.  Secondary this CT was obtained.  CT unremarkable without acute findings close stable clustered tree-in-bud opacities in right lower lobe with some calcifications which is thought to be sequela of prior infectious/inflammatory process. Based on patient's recent lifting sit  suspect that this is musculoskeletal in etiology Will continue anti-inflammatories and muscle relaxants Discussed return precautions need for follow-up patient voiced understanding.  Today's visit 09/16/2022: Reports intermittent bilateral back pain persisting. Reports she is established with Orthopedics and has an MRI of back appointment scheduled soon. Reports she feels "pressure" of genitourinary area. Reports she is established with Gynecology and has an annual women's wellness appointment scheduled soon which will include mammogram and pap smear. States the Emergency Department doctor told her to follow-up with Primary Care for ultrasound order to see if there are kidney stones present which wasn't shown on CT scan. Reports she is taking Development worker, community. States this is recommended in Muslim practices to "cure everything but death". She would like to know if this is ok to take. I discussed with patient in detail that I cannot advise her on her personal religious practices. However, I did discuss with patient in detail from a medical standpoint the supplement may cause adverse effects and not prescribed by any of her providers/specialist. Patient verbalized understanding and states she will discontinue taking Black Seed Oil for now until she finds out more details from specialists what is causing her back pain. No further issues/concerns for discussion today.     Past Medical History:  Diagnosis Date   Allergic rhinitis    Anxiety    Chronic back pain    Family history of systemic lupus erythematosus    GERD (gastroesophageal reflux disease)    Hypothyroidism due to Hashimoto's thyroiditis    IDA (iron deficiency anemia)  Morbid obesity (HCC)    OSA (obstructive sleep apnea)    No CPAP   Vitamin D deficiency    Allergies  Allergen Reactions   No Known Allergies     Current Outpatient Medications on File Prior to Visit  Medication Sig Dispense Refill   Butalbital-APAP-Caffeine  50-300-40 MG CAPS Take 1 tablet by mouth every 6 (six) hours as needed. (Patient not taking: Reported on 08/23/2022) 40 capsule 0   clindamycin (CLEOCIN T) 1 % lotion Apply 1 application topically daily.      diazepam (VALIUM) 5 MG tablet Take 1 tablet 30-40 minutes prior to procedure 1 tablet 0   doxycycline (VIBRA-TABS) 100 MG tablet Take 1 tablet (100 mg total) by mouth daily with food 30 tablet 1   escitalopram (LEXAPRO) 10 MG tablet Take 1 tablet (10 mg total) by mouth daily. 90 tablet 0   gabapentin (NEURONTIN) 300 MG capsule Take 1 capsule (300 mg total) by mouth at bedtime. 30 capsule 3   HYDROcodone-acetaminophen (NORCO) 10-325 MG tablet Take 1 tablet by mouth every 8 (eight) hours as needed. 60 tablet 0   levothyroxine (SYNTHROID) 137 MCG tablet Take 1 tablet (137 mcg total) by mouth daily. 90 tablet 0   lidocaine (LIDODERM) 5 % APPLY 1 PATCH TO SKIN ONCE DAILY (MAY WEAR UP TO 12HOURS.) 30 patch 0   meloxicam (MOBIC) 15 MG tablet Take 1 tablet (15 mg total) by mouth daily as needed. 15 tablet 0   metFORMIN (GLUCOPHAGE) 500 MG tablet Take 500 mg by mouth 3 (three) times daily.     methocarbamol (ROBAXIN) 500 MG tablet Take 1 tablet (500 mg total) by mouth 2 (two) times daily as needed for muscle spasms. 20 tablet 0   Multiple Vitamins-Minerals (MULTIVITAMIN) tablet Take 1 tablet by mouth daily. 90 tablet 0   omeprazole (PRILOSEC) 40 MG capsule Take 1 capsule (40 mg total) by mouth daily as needed. 90 capsule 0   topiramate (TOPAMAX) 25 MG tablet Take 1 tablet (25 mg total) by mouth daily. 30 tablet 5   Vitamin D, Ergocalciferol, (DRISDOL) 1.25 MG (50000 UNIT) CAPS capsule Take 1 capsule (50,000 Units total) by mouth every 7 (seven) days. 4 capsule 2   No current facility-administered medications on file prior to visit.    Observations/Objective: Alert and oriented x 3. Not in acute distress. Physical examination not completed as this is a telemedicine visit.  Assessment and Plan: 1.  Mid back pain - Keep all scheduled appointments with Orthopedics.  - Keep all scheduled appointments with Gynecology.  - US Renal for further evaluation.  - Follow-up with primary provider as scheduled.  - US Renal; Future   Follow Up Instructions: Follow-up with primary provider as scheduled.    Patient was given clear instructions to go to Emergency Department or return to medical center if symptoms don't improve, worsen, or new problems develop.The patient verbalized understanding.  I discussed the assessment and treatment plan with the patient. The patient was provided an opportunity to ask questions and all were answered. The patient agreed with the plan and demonstrated an understanding of the instructions.   The patient was advised to call back or seek an in-person evaluation if the symptoms worsen or if the condition fails to improve as anticipated.   I provided 10 minutes total of non-face-to-face time during this encounter.   Rema Fendt, NP  [facility] [place], [place] [phone] 09/16/2022, 1:28 PM

## 2022-09-18 ENCOUNTER — Other Ambulatory Visit (HOSPITAL_COMMUNITY): Payer: Self-pay

## 2022-09-18 MED ORDER — HYDROCODONE-ACETAMINOPHEN 10-325 MG PO TABS
1.0000 | ORAL_TABLET | Freq: Three times a day (TID) | ORAL | 0 refills | Status: DC | PRN
  Filled 2022-09-18: qty 60, 20d supply, fill #0

## 2022-09-19 ENCOUNTER — Other Ambulatory Visit (HOSPITAL_COMMUNITY): Payer: Self-pay

## 2022-09-19 MED ORDER — DOXYCYCLINE HYCLATE 100 MG PO TABS
ORAL_TABLET | ORAL | 0 refills | Status: DC
  Filled 2022-09-19: qty 30, 30d supply, fill #0

## 2022-09-20 ENCOUNTER — Other Ambulatory Visit: Payer: Self-pay

## 2022-09-22 ENCOUNTER — Ambulatory Visit
Admission: RE | Admit: 2022-09-22 | Discharge: 2022-09-22 | Disposition: A | Discharge status: Home / Self Care | Payer: No Typology Code available for payment source | Source: Ambulatory Visit | Attending: Family | Admitting: Family

## 2022-09-22 DIAGNOSIS — M549 Dorsalgia, unspecified: Secondary | ICD-10-CM

## 2022-09-23 ENCOUNTER — Other Ambulatory Visit: Payer: Self-pay | Admitting: Family

## 2022-09-23 ENCOUNTER — Telehealth: Payer: Self-pay | Admitting: Family

## 2022-09-23 DIAGNOSIS — K760 Fatty (change of) liver, not elsewhere classified: Secondary | ICD-10-CM

## 2022-09-23 NOTE — Telephone Encounter (Signed)
Call patient with update. Hepatic steatosis is also known as a "fatty liver". We will need further evaluation/management from Gastroenterology for next steps/advisement. During the interim for any immediate concerns please report to the Urgent Care/Emergency Department.

## 2022-09-23 NOTE — Telephone Encounter (Signed)
Pt. Calling about Renal US. Concerned about diagnosis. Would like PCP to call her at 1:45 to explain. She is at work today.

## 2022-09-23 NOTE — Telephone Encounter (Signed)
Pt is aware of this.  °

## 2022-09-27 ENCOUNTER — Other Ambulatory Visit (HOSPITAL_COMMUNITY): Payer: Self-pay

## 2022-09-27 MED ORDER — IBUPROFEN 800 MG PO TABS
ORAL_TABLET | ORAL | 2 refills | Status: DC
  Filled 2022-09-27: qty 90, 30d supply, fill #0
  Filled 2022-10-22: qty 90, 30d supply, fill #1

## 2022-10-04 ENCOUNTER — Other Ambulatory Visit: Payer: Self-pay | Admitting: Family

## 2022-10-04 ENCOUNTER — Other Ambulatory Visit (HOSPITAL_COMMUNITY): Payer: Self-pay

## 2022-10-04 NOTE — Telephone Encounter (Signed)
Metformin and Clindamycin lotion appear as historical medications. Schedule appointment.

## 2022-10-05 ENCOUNTER — Other Ambulatory Visit (HOSPITAL_COMMUNITY): Payer: Self-pay

## 2022-10-06 ENCOUNTER — Telehealth: Payer: Self-pay | Admitting: Family

## 2022-10-06 ENCOUNTER — Other Ambulatory Visit: Payer: Self-pay | Admitting: Family

## 2022-10-06 DIAGNOSIS — E559 Vitamin D deficiency, unspecified: Secondary | ICD-10-CM

## 2022-10-15 ENCOUNTER — Other Ambulatory Visit (HOSPITAL_COMMUNITY): Payer: Self-pay

## 2022-10-18 ENCOUNTER — Other Ambulatory Visit (HOSPITAL_COMMUNITY): Payer: Self-pay

## 2022-10-18 MED ORDER — HYDROCODONE-ACETAMINOPHEN 10-325 MG PO TABS
1.0000 | ORAL_TABLET | Freq: Three times a day (TID) | ORAL | 0 refills | Status: AC | PRN
Start: 2022-10-18 — End: ?
  Filled 2022-10-18: qty 60, 20d supply, fill #0

## 2022-11-19 ENCOUNTER — Other Ambulatory Visit (HOSPITAL_COMMUNITY): Payer: Self-pay

## 2022-11-21 ENCOUNTER — Other Ambulatory Visit (HOSPITAL_COMMUNITY): Payer: Self-pay

## 2022-11-21 ENCOUNTER — Other Ambulatory Visit: Payer: Self-pay

## 2022-11-21 MED ORDER — HYDROCODONE-ACETAMINOPHEN 10-325 MG PO TABS
1.0000 | ORAL_TABLET | Freq: Three times a day (TID) | ORAL | 0 refills | Status: DC | PRN
Start: 2022-11-20 — End: 2022-12-13
  Filled 2022-11-21: qty 60, 20d supply, fill #0

## 2022-11-22 ENCOUNTER — Other Ambulatory Visit (HOSPITAL_COMMUNITY): Payer: Self-pay

## 2022-11-23 ENCOUNTER — Ambulatory Visit: Payer: No Typology Code available for payment source | Admitting: Family

## 2022-11-23 NOTE — Progress Notes (Deleted)
NEUROLOGY FOLLOW UP OFFICE NOTE  Heather Campos [id number]  Assessment/Plan:   Right unilateral headache - unclear etiology.  Not consistent with migraine, tension type headache. Does not appear to be TMJ dysfunction or cervicogenic.  No clear autonomic symptoms to suggest hemicrania continua   Check CTA of head Check sed rate and crp Stop Fioricet.  May take Tylenol or ibuprofen but limit to no more than 2 days out of week to prevent rebound headache. Will hold off on starting a daily preventative medication until after testing and monitor if headache continues to improve.  Will start something if headaches worsen  Subjective:  Heather Campos is a 47 year old female with hypothyroidism, MGUS, chronic back pain and OSA who follows up for right sided headache.  UPDATE: Sed rate and CRP on 05/25/2022 were 38 and 1.1 respectively.  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  To evaluate for elevated intracranial pressure, she underwent LP on 06/15/2022 which revealed normal opening pressure of 12 cm water.  CSF labs (including cell count, protein, glucose) were normal.  She started topiramate.  Advised to stop Fioricet. Current NSAIDS/analgesics:  ibuprofen, Tylenol *** Current triptans:  none Current ergotamine:  none Current anti-emetic:  none Current muscle relaxants:  none Current Antihypertensive medications:  none Current Antidepressant medications:  none Current Anticonvulsant medications:  topiramate 25mg  at bedtime Current anti-CGRP:  none Current Vitamins/Herbal/Supplements:  MVI Current Antihistamines/Decongestants:  none Other therapy:  none Birth control:  none Other medications:  hydroxyzine, levothyroxine   HISTORY: On 02/18/2022 she was sitting watching TV when she suddenly felt a pop in the right temple followed by onset  of severe persistent non-throbbing right temporal headache.  Went to the ED but left due to long wait.  Blood pressure reportedly elevated at 189 systolic.  Headache persisted so she returned to the ED on 12/31.  Blood pressure was elevated at 189 systolic.  Sed rate was 16.  MRI of brain without contrast personally reviewed showed partially empty sella but otherwise unremarkable.  Treated with headache cocktail which helped and she was discharged on Fioricet.     She was fine until beginning of April 2024.  She was laying down that night when she suddenly developed a severe right stabbing parietal pain for 2-3 minutes and gradually reduced to moderate pain involving the right temple as well.  When laying on her right side, the temple felt tender.  Her fight temple feels "puffy".  No change in vision, ptosis, eye lacrimation.  No jaw or neck pain.   She had a formal eye exam in January 2024 which didn't reveal papilledema.  She did need glasses.     Prior to all this, she may have a mild tension type headache from time to time but nothing like these headaches.     No known family history of aneurysm but her grandmother had a brain bleed (unknown etiology).   Past NSAIDS/analgesics:  meloxicam, Fioricet Past abortive triptans:  none Past abortive ergotamine:  none Past muscle relaxants:  methocarbamol Past anti-emetic:  none Past antihypertensive medications:  none Past antidepressant medications:  sertraline Past anticonvulsant medications:  gabapentin Past anti-CGRP:  none Past vitamins/Herbal/Supplements:  none Past antihistamines/decongestants:  Flonase Other past therapies:  none     PAST MEDICAL HISTORY: Past Medical History:  Diagnosis Date   Allergic rhinitis    Anxiety  Chronic back pain    Family history of systemic lupus erythematosus    GERD (gastroesophageal reflux disease)    Hypothyroidism due to Hashimoto's thyroiditis    IDA (iron deficiency anemia)    Morbid  obesity (HCC)    OSA (obstructive sleep apnea)    No CPAP   Vitamin D deficiency     MEDICATIONS: Current Outpatient Medications on File Prior to Visit  Medication Sig Dispense Refill   Butalbital-APAP-Caffeine 50-300-40 MG CAPS Take 1 tablet by mouth every 6 (six) hours as needed. (Patient not taking: Reported on 08/23/2022) 40 capsule 0   clindamycin (CLEOCIN T) 1 % lotion Apply 1 application topically daily.      diazepam (VALIUM) 5 MG tablet Take 1 tablet 30-40 minutes prior to procedure 1 tablet 0   doxycycline (VIBRA-TABS) 100 MG tablet Take 1 tablet (100 mg total) by mouth daily with food 30 tablet 0   escitalopram (LEXAPRO) 10 MG tablet Take 1 tablet (10 mg total) by mouth daily. 90 tablet 0   gabapentin (NEURONTIN) 300 MG capsule Take 1 capsule (300 mg total) by mouth at bedtime. 30 capsule 3   HYDROcodone-acetaminophen (NORCO) 10-325 MG tablet Take 1 tablet by mouth every 8 (eight) hours as needed. 60 tablet 0   HYDROcodone-acetaminophen (NORCO) 10-325 MG tablet TAKE 1 TABLET BY MOUTH EVERY 8 HOURS AS NEEDED 60 tablet 0   ibuprofen (ADVIL) 800 MG tablet Take 1 tablet by mouth 3 times a day as needed. 90 tablet 2   levothyroxine (SYNTHROID) 137 MCG tablet Take 1 tablet (137 mcg total) by mouth daily. 90 tablet 0   lidocaine (LIDODERM) 5 % APPLY 1 PATCH TO SKIN ONCE DAILY (MAY WEAR UP TO 12HOURS.) 30 patch 0   meloxicam (MOBIC) 15 MG tablet Take 1 tablet (15 mg total) by mouth daily as needed. 15 tablet 0   metFORMIN (GLUCOPHAGE) 500 MG tablet Take 500 mg by mouth 3 (three) times daily.     methocarbamol (ROBAXIN) 500 MG tablet Take 1 tablet (500 mg total) by mouth 2 (two) times daily as needed for muscle spasms. 20 tablet 0   Multiple Vitamins-Minerals (MULTIVITAMIN) tablet Take 1 tablet by mouth daily. 90 tablet 0   omeprazole (PRILOSEC) 40 MG capsule Take 1 capsule (40 mg total) by mouth daily as needed. 90 capsule 0   topiramate (TOPAMAX) 25 MG tablet Take 1 tablet (25 mg total) by  mouth daily. 30 tablet 5   Vitamin D, Ergocalciferol, (DRISDOL) 1.25 MG (50000 UNIT) CAPS capsule Take 1 capsule (50,000 Units total) by mouth every 7 (seven) days. 4 capsule 2   No current facility-administered medications on file prior to visit.    ALLERGIES: Allergies  Allergen Reactions   No Known Allergies     FAMILY HISTORY: Family History  Problem Relation Age of Onset   Lupus Mother    Kidney disease Mother    Lupus Cousin       Objective:  *** General: No acute distress.  Patient appears ***-groomed.   Head:  Normocephalic/atraumatic Eyes:  Fundi examined but not visualized Neck: supple, no paraspinal tenderness, full range of motion Heart:  Regular rate and rhythm Lungs:  Clear to auscultation bilaterally Back: No paraspinal tenderness Neurological Exam: alert and oriented.  Speech fluent and not dysarthric, language intact.  CN II-XII intact. Bulk and tone normal, muscle strength 5/5 throughout.  Sensation to light touch intact.  Deep tendon reflexes 2+ throughout, toes downgoing.  Finger to nose testing intact.  Gait normal, Romberg negative.   Shon Millet, DO  CC: ***

## 2022-11-25 ENCOUNTER — Encounter: Payer: Self-pay | Admitting: Neurology

## 2022-11-25 ENCOUNTER — Ambulatory Visit
Admission: EM | Admit: 2022-11-25 | Discharge: 2022-11-25 | Disposition: A | Discharge status: Home / Self Care | Payer: No Typology Code available for payment source | Attending: Internal Medicine | Admitting: Internal Medicine

## 2022-11-25 ENCOUNTER — Ambulatory Visit: Payer: No Typology Code available for payment source | Admitting: Neurology

## 2022-11-25 DIAGNOSIS — M542 Cervicalgia: Secondary | ICD-10-CM

## 2022-11-25 DIAGNOSIS — Z029 Encounter for administrative examinations, unspecified: Secondary | ICD-10-CM

## 2022-11-25 MED ORDER — METHOCARBAMOL 500 MG PO TABS
500.0000 mg | ORAL_TABLET | Freq: Two times a day (BID) | ORAL | 0 refills | Status: DC | PRN
  Filled 2022-11-25: qty 20, 10d supply, fill #0
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
## 2022-11-25 NOTE — Discharge Instructions (Signed)
I have prescribed a muscle relaxer to take as needed but please be advised that it can make you drowsy so no driving or drinking alcohol with it.  Do not take in the same day of hydrocodone if you can help it.  Follow-up with orthopedist if pain persists or worsens.
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
## 2022-11-26 ENCOUNTER — Other Ambulatory Visit (HOSPITAL_COMMUNITY): Payer: Self-pay

## 2022-11-29 ENCOUNTER — Emergency Department (HOSPITAL_COMMUNITY): Payer: No Typology Code available for payment source

## 2022-11-29 ENCOUNTER — Other Ambulatory Visit: Payer: Self-pay

## 2022-11-29 ENCOUNTER — Encounter (HOSPITAL_COMMUNITY): Payer: Self-pay

## 2022-11-29 ENCOUNTER — Emergency Department (HOSPITAL_COMMUNITY)
Admission: EM | Admit: 2022-11-29 | Discharge: 2022-11-29 | Disposition: A | Discharge status: Home / Self Care | Payer: No Typology Code available for payment source | Attending: Student | Admitting: Student

## 2022-11-29 DIAGNOSIS — Z8616 Personal history of COVID-19: Secondary | ICD-10-CM | POA: Diagnosis not present

## 2022-11-29 DIAGNOSIS — R519 Headache, unspecified: Secondary | ICD-10-CM | POA: Insufficient documentation

## 2022-11-29 DIAGNOSIS — E039 Hypothyroidism, unspecified: Secondary | ICD-10-CM | POA: Diagnosis not present

## 2022-11-29 LAB — HCG, SERUM, QUALITATIVE: Preg, Serum: NEGATIVE

## 2022-11-29 LAB — I-STAT CHEM 8, ED
BUN: 7 mg/dL (ref 6–20)
Calcium, Ion: 1.18 mmol/L (ref 1.15–1.40)
Chloride: 106 mmol/L (ref 98–111)
Creatinine, Ser: 0.8 mg/dL (ref 0.44–1.00)
Glucose, Bld: 105 mg/dL — ABNORMAL HIGH (ref 70–99)
HCT: 36 % (ref 36.0–46.0)
Hemoglobin: 12.2 g/dL (ref 12.0–15.0)
Potassium: 3.8 mmol/L (ref 3.5–5.1)
Sodium: 139 mmol/L (ref 135–145)
TCO2: 22 mmol/L (ref 22–32)

## 2022-11-29 LAB — CBC WITH DIFFERENTIAL/PLATELET
Abs Immature Granulocytes: 0 10*3/uL (ref 0.00–0.07)
Basophils Absolute: 0 10*3/uL (ref 0.0–0.1)
Basophils Relative: 0 %
Eosinophils Absolute: 0.5 10*3/uL (ref 0.0–0.5)
Eosinophils Relative: 4 %
HCT: 36.4 % (ref 36.0–46.0)
Hemoglobin: 12.1 g/dL (ref 12.0–15.0)
Lymphocytes Relative: 44 %
Lymphs Abs: 5.3 10*3/uL — ABNORMAL HIGH (ref 0.7–4.0)
MCH: 27.9 pg (ref 26.0–34.0)
MCHC: 33.2 g/dL (ref 30.0–36.0)
MCV: 83.9 fL (ref 80.0–100.0)
Monocytes Absolute: 0.8 10*3/uL (ref 0.1–1.0)
Monocytes Relative: 7 %
Neutro Abs: 5.4 10*3/uL (ref 1.7–7.7)
Neutrophils Relative %: 45 %
Platelets: 270 10*3/uL (ref 150–400)
RBC: 4.34 MIL/uL (ref 3.87–5.11)
RDW: 13.7 % (ref 11.5–15.5)
WBC: 12 10*3/uL — ABNORMAL HIGH (ref 4.0–10.5)
nRBC: 0 % (ref 0.0–0.2)

## 2022-11-29 MED ORDER — IBUPROFEN 200 MG PO TABS: 400.0000 mg | ORAL_TABLET | Freq: Once | ORAL | Status: DC | PRN

## 2022-11-29 MED ORDER — DIPHENHYDRAMINE HCL 50 MG/ML IJ SOLN
25.0000 mg | Freq: Once | INTRAMUSCULAR | Status: AC
  Administered 2022-11-29: 25 mg via INTRAVENOUS
  Filled 2022-11-29: qty 1

## 2022-11-29 MED ORDER — ACETAMINOPHEN 325 MG PO TABS
650.0000 mg | ORAL_TABLET | Freq: Once | ORAL | Status: AC | PRN
  Administered 2022-11-29: 650 mg via ORAL
  Filled 2022-11-29: qty 2

## 2022-11-29 MED ORDER — IOHEXOL 350 MG/ML SOLN
75.0000 mL | Freq: Once | INTRAVENOUS | Status: AC | PRN
  Administered 2022-11-29: 75 mL via INTRAVENOUS

## 2022-11-29 MED ORDER — PROCHLORPERAZINE EDISYLATE 10 MG/2ML IJ SOLN
10.0000 mg | Freq: Once | INTRAMUSCULAR | Status: AC
  Administered 2022-11-29: 10 mg via INTRAVENOUS
  Filled 2022-11-29: qty 2
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
## 2022-11-29 NOTE — ED Triage Notes (Signed)
Pt arrived from home via POV c/o severe headache. Pt states that it felt like something popped in her head and now has the worst head ache of her life. NIH 0. Pt is Nauseous, but zero other symptoms.

## 2022-11-30 ENCOUNTER — Other Ambulatory Visit (HOSPITAL_COMMUNITY): Payer: Self-pay

## 2022-11-30 MED ORDER — METFORMIN HCL 500 MG PO TABS
1000.0000 mg | ORAL_TABLET | Freq: Two times a day (BID) | ORAL | 1 refills | Status: DC
  Filled 2022-11-30: qty 120, 30d supply, fill #0
  Filled 2023-01-26: qty 120, 30d supply, fill #1

## 2022-11-30 MED ORDER — CLINDAMYCIN PHOSPHATE 1 % EX LOTN
TOPICAL_LOTION | CUTANEOUS | 3 refills | Status: DC
Start: 2022-11-30 — End: 2023-08-21
  Filled 2022-11-30: qty 60, 30d supply, fill #0
  Filled 2023-01-26: qty 60, 30d supply, fill #1
  Filled 2023-03-23 – 2023-03-24 (×2): qty 60, 30d supply, fill #2
  Filled 2023-04-18: qty 60, 30d supply, fill #3

## 2022-12-05 ENCOUNTER — Ambulatory Visit: Payer: No Typology Code available for payment source | Admitting: Physical Medicine and Rehabilitation

## 2022-12-07 ENCOUNTER — Other Ambulatory Visit (HOSPITAL_COMMUNITY): Payer: Self-pay

## 2022-12-07 ENCOUNTER — Ambulatory Visit: Payer: No Typology Code available for payment source | Admitting: Family

## 2022-12-08 ENCOUNTER — Encounter: Payer: Self-pay | Admitting: Physical Medicine and Rehabilitation

## 2022-12-08 ENCOUNTER — Ambulatory Visit: Payer: No Typology Code available for payment source | Admitting: Physical Medicine and Rehabilitation

## 2022-12-08 DIAGNOSIS — M7918 Myalgia, other site: Secondary | ICD-10-CM

## 2022-12-08 DIAGNOSIS — M546 Pain in thoracic spine: Secondary | ICD-10-CM | POA: Diagnosis not present

## 2022-12-08 DIAGNOSIS — S39012A Strain of muscle, fascia and tendon of lower back, initial encounter: Secondary | ICD-10-CM
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
## 2022-12-13 ENCOUNTER — Encounter: Payer: Self-pay | Admitting: Family

## 2022-12-13 ENCOUNTER — Other Ambulatory Visit (HOSPITAL_COMMUNITY): Payer: Self-pay

## 2022-12-13 ENCOUNTER — Ambulatory Visit: Payer: No Typology Code available for payment source | Admitting: Family

## 2022-12-13 VITALS — BP 131/87 | HR 78 | Temp 99.0°F | Ht 64.0 in | Wt 265.4 lb

## 2022-12-13 DIAGNOSIS — Z6841 Body Mass Index (BMI) 40.0 and over, adult: Secondary | ICD-10-CM

## 2022-12-13 DIAGNOSIS — F32A Depression, unspecified: Secondary | ICD-10-CM

## 2022-12-13 DIAGNOSIS — E66813 Obesity, class 3: Secondary | ICD-10-CM

## 2022-12-13 DIAGNOSIS — E6609 Other obesity due to excess calories: Secondary | ICD-10-CM

## 2022-12-13 DIAGNOSIS — E039 Hypothyroidism, unspecified: Secondary | ICD-10-CM | POA: Diagnosis not present

## 2022-12-13 DIAGNOSIS — F419 Anxiety disorder, unspecified: Secondary | ICD-10-CM

## 2022-12-13 DIAGNOSIS — R7303 Prediabetes: Secondary | ICD-10-CM

## 2022-12-13 MED ORDER — LEVOTHYROXINE SODIUM 137 MCG PO TABS
137.0000 ug | ORAL_TABLET | Freq: Every day | ORAL | 0 refills | Status: DC
Start: 2022-12-13 — End: 2023-03-23
  Filled 2022-12-13: qty 30, 30d supply, fill #0
  Filled 2023-01-26: qty 30, 30d supply, fill #1
  Filled 2023-02-20: qty 30, 30d supply, fill #2

## 2022-12-13 MED ORDER — ESCITALOPRAM OXALATE 10 MG PO TABS
10.0000 mg | ORAL_TABLET | Freq: Every day | ORAL | 0 refills | Status: DC
Start: 2022-12-13 — End: 2022-12-19
  Filled 2022-12-13: qty 30, 30d supply, fill #0

## 2022-12-13 NOTE — Progress Notes (Signed)
Patient ID: Heather Campos, female    DOB: [birth year]  MRN: [id number]  CC: Chronic Conditions Follow-Up  Subjective: Heather Campos is a 47 y.o. female who presents for chronic conditions follow-up.   Her concerns today include:  - Doing well on Levothyroxine, no issues/concerns.  - Doing well on Lexapro, no issues/concerns. She denies thoughts of self-harm, suicidal ideations, homicidal ideations.  Patient Active Problem List   Diagnosis Date Noted   Prediabetes 05/25/2022   Anxiety and depression 12/24/2021   Cerumen debris on tympanic membrane of left ear 09/30/2021   Hypothyroidism 08/20/2021   Chronic pain 08/17/2021   Lumbar spondylosis 06/29/2021   Bilateral carpal tunnel syndrome 06/23/2021   Pain in joint of left knee 02/04/2021   COVID-19 11/27/2020   Class 3 severe obesity due to excess calories without serious comorbidity with body mass index (BMI) of 40.0 to 44.9 in adult (HCC) 11/27/2020   Pain in joint of right shoulder 10/06/2020   Calcific tendinitis of right shoulder 10/06/2020   Low back pain 08/13/2020   Insomnia 04/14/2020   Pain of toe of right foot 02/24/2020   Stiffness of left shoulder joint 11/14/2019   Adhesive capsulitis of left shoulder 10/02/2019   Shoulder pain, left 09/03/2019   Calcific tendinitis of left shoulder 09/03/2019   Fibroid uterus 07/04/2019   Uterine fibroid 07/04/2019   Tachycardia 05/14/2019   History of COVID-19 05/13/2019   Hair loss 05/13/2019   OSA (obstructive sleep apnea) 02/09/2016   Family history of systemic lupus erythematosus 02/09/2016   Vitamin D deficiency 09/02/2015   Morbid obesity due to excess calories (HCC) 09/02/2015   Iron deficiency anemia due to chronic blood loss 09/02/2015   Hypothyroidism due to Hashimoto's thyroiditis 09/02/2015     Current Outpatient Medications on File Prior to Visit  Medication Sig Dispense Refill   Butalbital-APAP-Caffeine 50-300-40 MG CAPS Take 1 tablet by mouth  every 6 (six) hours as needed. 40 capsule 0   clindamycin (CLEOCIN T) 1 % lotion Apply 1 application topically daily.      clindamycin (CLEOCIN T) 1 % lotion Apply to affected area daily as directed 60 mL 3   diazepam (VALIUM) 5 MG tablet Take 1 tablet 30-40 minutes prior to procedure 1 tablet 0   HYDROcodone-acetaminophen (NORCO) 10-325 MG tablet Take 1 tablet by mouth every 8 (eight) hours as needed. 60 tablet 0   ibuprofen (ADVIL) 800 MG tablet Take 1 tablet by mouth 3 times a day as needed. 90 tablet 2   meloxicam (MOBIC) 15 MG tablet Take 1 tablet (15 mg total) by mouth daily as needed. 15 tablet 0   metFORMIN (GLUCOPHAGE) 500 MG tablet Take 2 tablets (1,000 mg total) by mouth 2 (two) times daily with a meal. 120 tablet 1   methocarbamol (ROBAXIN) 500 MG tablet Take 1 tablet (500 mg total) by mouth 2 (two) times daily as needed for muscle spasms. 20 tablet 0   omeprazole (PRILOSEC) 40 MG capsule Take 1 capsule (40 mg total) by mouth daily as needed. 90 capsule 0   Vitamin D, Ergocalciferol, (DRISDOL) 1.25 MG (50000 UNIT) CAPS capsule Take 1 capsule (50,000 Units total) by mouth every 7 (seven) days. 4 capsule 2   No current facility-administered medications on file prior to visit.    Allergies  Allergen Reactions   No Known Allergies     Social History   Socioeconomic History   Marital status: Married    Spouse name: Not on file  Number of children: Not on file   Years of education: Not on file   Highest education level: Some college, no degree  Occupational History   Not on file  Tobacco Use   Smoking status: Former    Passive exposure: Past   Smokeless tobacco: Never  Vaping Use   Vaping status: Never Used  Substance and Sexual Activity   Alcohol use: No   Drug use: No   Sexual activity: Yes    Birth control/protection: Surgical  Other Topics Concern   Not on file  Social History Narrative   Not on file   Social Determinants of Health   Financial Resource  Strain: Low Risk  (12/10/2022)   Overall Financial Resource Strain (CARDIA)    Difficulty of Paying Living Expenses: Not hard at all  Food Insecurity: No Food Insecurity (12/10/2022)   Hunger Vital Sign    Worried About Running Out of Food in the Last Year: Never true    Ran Out of Food in the Last Year: Never true  Transportation Needs: No Transportation Needs (12/10/2022)   PRAPARE - Administrator, Civil Service (Medical): No    Lack of Transportation (Non-Medical): No  Physical Activity: Insufficiently Active (12/10/2022)   Exercise Vital Sign    Days of Exercise per Week: 2 days    Minutes of Exercise per Session: 10 min  Stress: No Stress Concern Present (12/10/2022)   [employer] of Occupational Health - Occupational Stress Questionnaire    Feeling of Stress : Only a little  Social Connections: Socially Integrated (12/10/2022)   Social Connection and Isolation Panel [NHANES]    Frequency of Communication with Friends and Family: More than three times a week    Frequency of Social Gatherings with Friends and Family: Once a week    Attends Religious Services: More than 4 times per year    Active Member of Clubs or Organizations: Yes    Attends Engineer, structural: More than 4 times per year    Marital Status: Married  Catering manager Violence: Not on file    Family History  Problem Relation Age of Onset   Lupus Mother    Kidney disease Mother    Lupus Cousin     Past Surgical History:  Procedure Laterality Date   ABDOMINAL HYSTERECTOMY     CESAREAN SECTION     CHOLECYSTECTOMY     CYSTOSCOPY  07/04/2019   Procedure: CYSTOSCOPY;  Surgeon: Essie Hart, MD;  Location: [facility];  Service: Gynecology;;   HYSTERECTOMY ABDOMINAL WITH SALPINGECTOMY Bilateral 07/04/2019   Procedure: ABDOMINAL  suprcervical hysterctomyWITH SALPINGECTOMY ;  Surgeon: Essie Hart, MD;  Location: [facility];  Service: Gynecology;   Laterality: Bilateral;   TOTAL LAPAROSCOPIC HYSTERECTOMY WITH SALPINGECTOMY Bilateral 07/04/2019   Procedure: attemted TOTAL LAPAROSCOPIC HYSTERECTOMY WITH SALPINGECTOMY;  Surgeon: Essie Hart, MD;  Location: [facility] [place];  Service: Gynecology;  Laterality: Bilateral;  3 hours    ROS: Review of Systems Negative except as stated above  PHYSICAL EXAM: BP 131/87   Pulse 78   Temp 99 F (37.2 C) (Oral)   Ht 5\' 4"  (1.626 m)   Wt 265 lb 6.4 oz (120.4 kg)   LMP 06/24/2019   SpO2 94%   BMI 45.56 kg/m   Physical Exam HENT:     Head: Normocephalic and atraumatic.     Nose: Nose normal.     Mouth/Throat:     Mouth: Mucous membranes are moist.  Pharynx: Oropharynx is clear.  Eyes:     Extraocular Movements: Extraocular movements intact.     Conjunctiva/sclera: Conjunctivae normal.     Pupils: Pupils are equal, round, and reactive to light.  Cardiovascular:     Rate and Rhythm: Normal rate and regular rhythm.     Pulses: Normal pulses.     Heart sounds: Normal heart sounds.  Pulmonary:     Effort: Pulmonary effort is normal.     Breath sounds: Normal breath sounds.  Musculoskeletal:        General: Normal range of motion.     Cervical back: Normal range of motion and neck supple.  Neurological:     General: No focal deficit present.     Mental Status: She is alert and oriented to person, place, and time.  Psychiatric:        Mood and Affect: Mood normal.        Behavior: Behavior normal.     ASSESSMENT AND PLAN: 1. Hypothyroidism, unspecified type - Continue Levothyroxine as prescribed. Counseled on medication adherence/adverse effects.  - Routine screening.  - Follow-up with primary provider as scheduled. - TSH - levothyroxine (SYNTHROID) 137 MCG tablet; Take 1 tablet (137 mcg total) by mouth daily.  Dispense: 90 tablet; Refill: 0  2. Anxiety and depression - Patient denies thoughts of self-harm, suicidal ideations, homicidal ideations. - Continue  Escitalopram as prescribed. Counseled on medication adherence/adverse effects.  - Follow-up with primary provider in 3 months or sooner if needed.  - escitalopram (LEXAPRO) 10 MG tablet; Take 1 tablet (10 mg total) by mouth daily.  Dispense: 90 tablet; Refill: 0  3. Prediabetes - Routine screening.  - Hemoglobin A1c    Patient was given the opportunity to ask questions.  Patient verbalized understanding of the plan and was able to repeat key elements of the plan. Patient was given clear instructions to go to Emergency Department or return to medical center if symptoms don't improve, worsen, or new problems develop.The patient verbalized understanding.   Orders Placed This Encounter  Procedures   TSH   Hemoglobin A1c     Requested Prescriptions   Signed Prescriptions Disp Refills   levothyroxine (SYNTHROID) 137 MCG tablet 90 tablet 0    Sig: Take 1 tablet (137 mcg total) by mouth daily.   escitalopram (LEXAPRO) 10 MG tablet 90 tablet 0    Sig: Take 1 tablet (10 mg total) by mouth daily.    Return in about 3 months (around 03/15/2023) for Follow-Up or next available chronic conditions.  Rema Fendt, NP

## 2022-12-13 NOTE — Progress Notes (Signed)
PaTIENT states no concerns to discuss.  Declined Flu vaccine.

## 2022-12-14 LAB — HEMOGLOBIN A1C
Est. average glucose Bld gHb Est-mCnc: 123 mg/dL
Hgb A1c MFr Bld: 5.9 % — ABNORMAL HIGH (ref 4.8–5.6)

## 2022-12-14 LAB — TSH: TSH: 1.54 u[IU]/mL (ref 0.450–4.500)

## 2022-12-15 ENCOUNTER — Other Ambulatory Visit (HOSPITAL_COMMUNITY): Payer: Self-pay

## 2022-12-15 MED ORDER — HYDROCODONE-ACETAMINOPHEN 10-325 MG PO TABS
ORAL_TABLET | ORAL | 0 refills | Status: DC
Start: 2022-12-15 — End: 2023-01-17
  Filled 2022-12-15: qty 60, 20d supply, fill #0

## 2022-12-16 ENCOUNTER — Other Ambulatory Visit (HOSPITAL_COMMUNITY): Payer: Self-pay

## 2022-12-19 ENCOUNTER — Other Ambulatory Visit (HOSPITAL_COMMUNITY): Payer: Self-pay | Admitting: Medical

## 2022-12-19 ENCOUNTER — Other Ambulatory Visit (HOSPITAL_COMMUNITY): Payer: Self-pay

## 2022-12-20 ENCOUNTER — Ambulatory Visit: Payer: Self-pay | Admitting: Physical Therapy

## 2023-01-09 IMAGING — CR DG BONE SURVEY MET
10 series · 10 of 10 positions shown · non-contrast
Comparison: 01/29/2020

CLINICAL DATA: MGUS

EXAM:
METASTATIC BONE SURVEY

[skull lat]
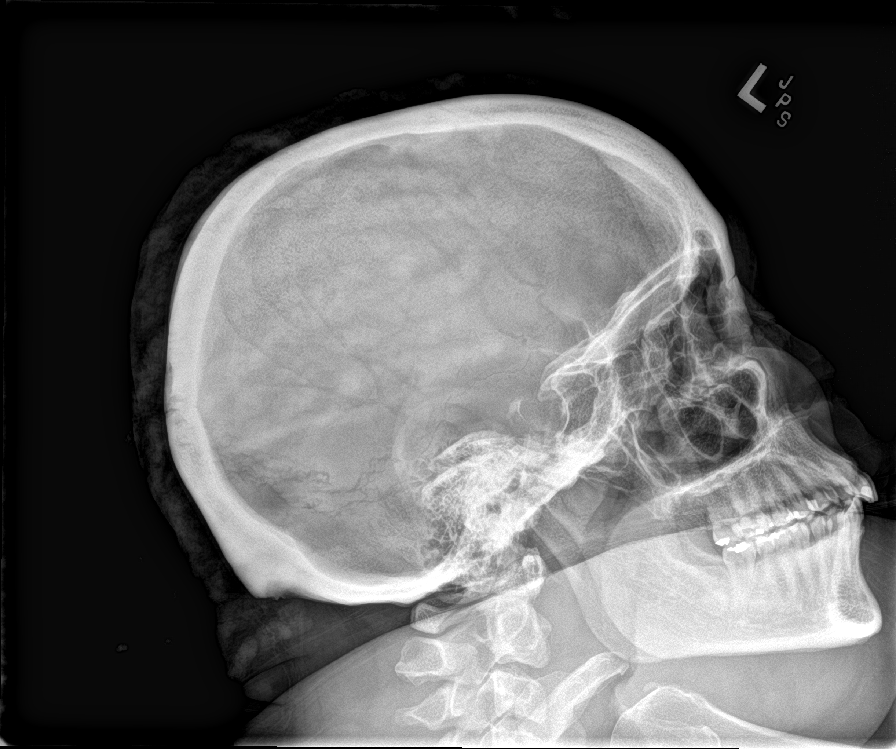

[shoulder ap (1 of 2)]
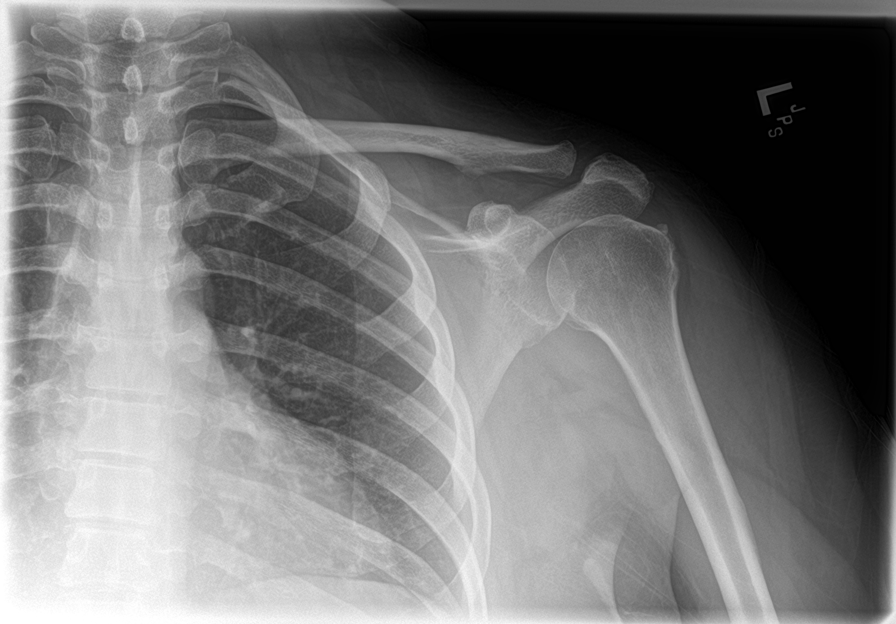

[shoulder ap (2 of 2)]
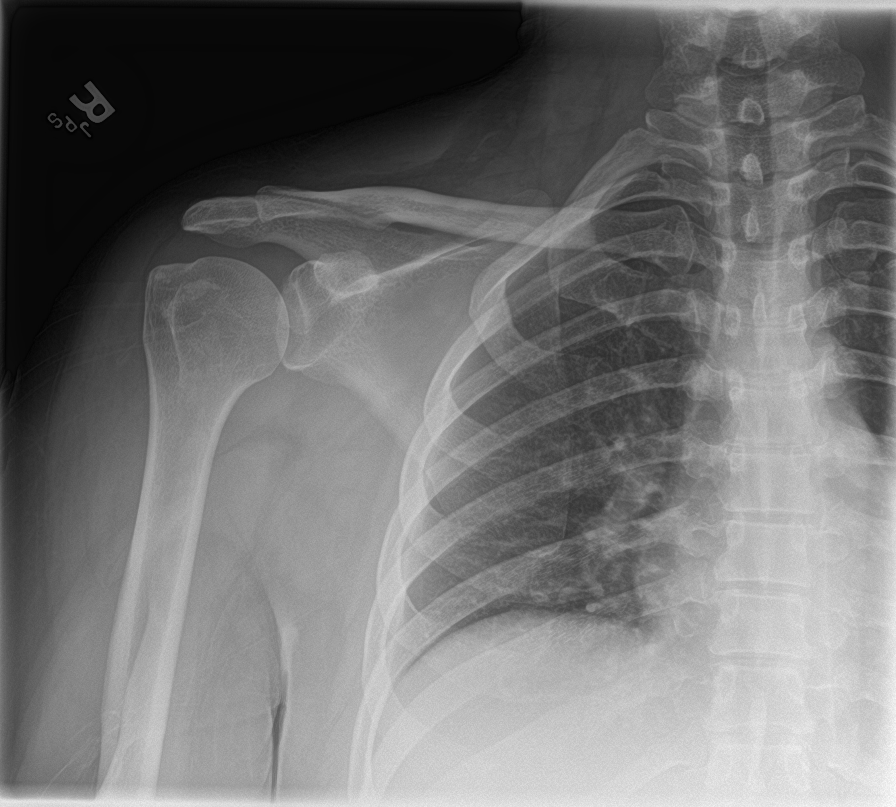

[humerus ap (1 of 2)]
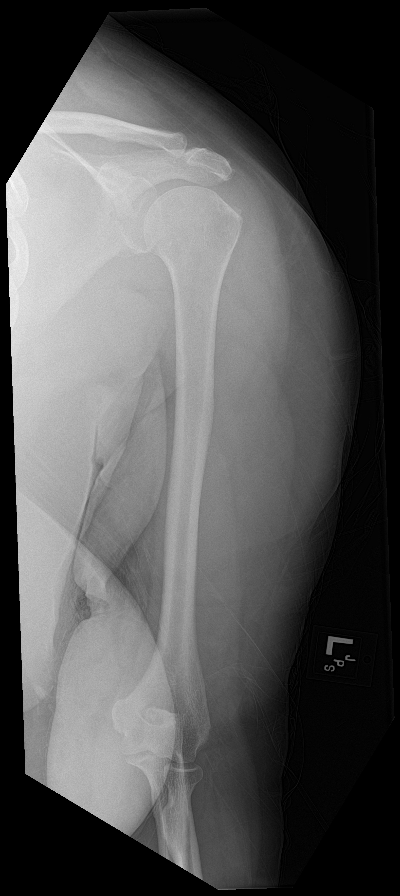

[humerus ap (2 of 2)]
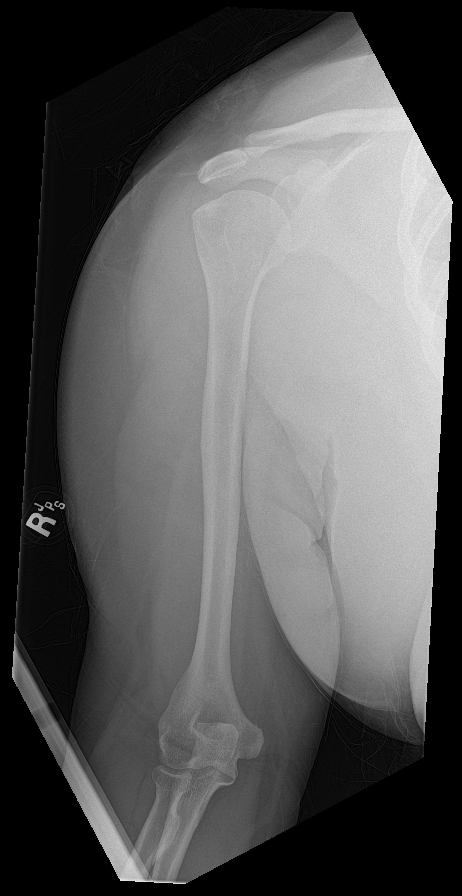

[forearm ap (1 of 2)]
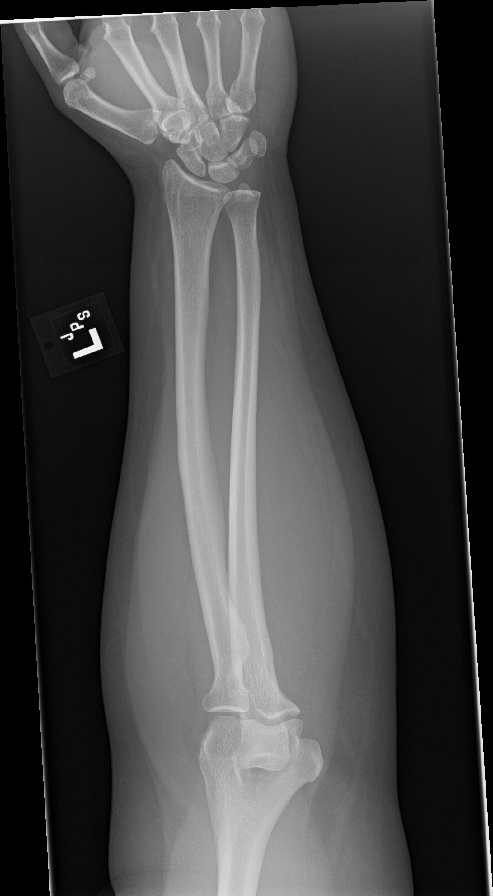

[forearm ap (2 of 2)]
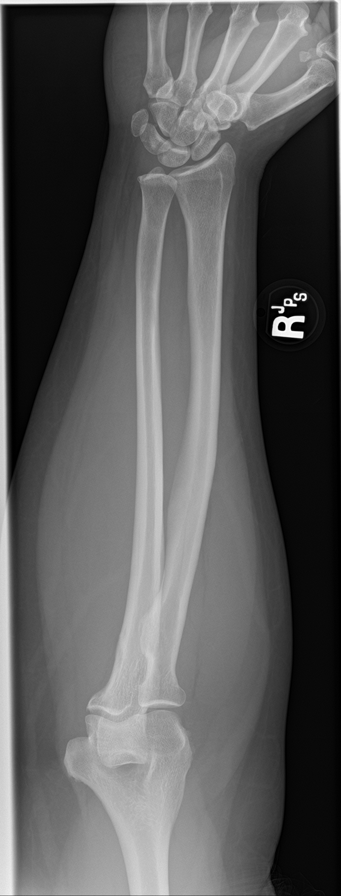

[c-spine ap]
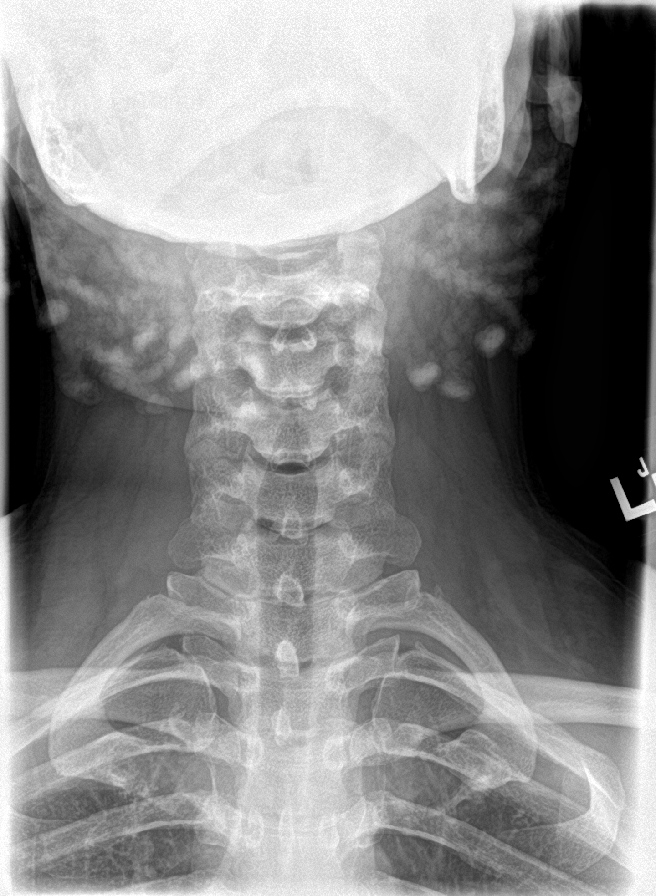

[c-spine lat]
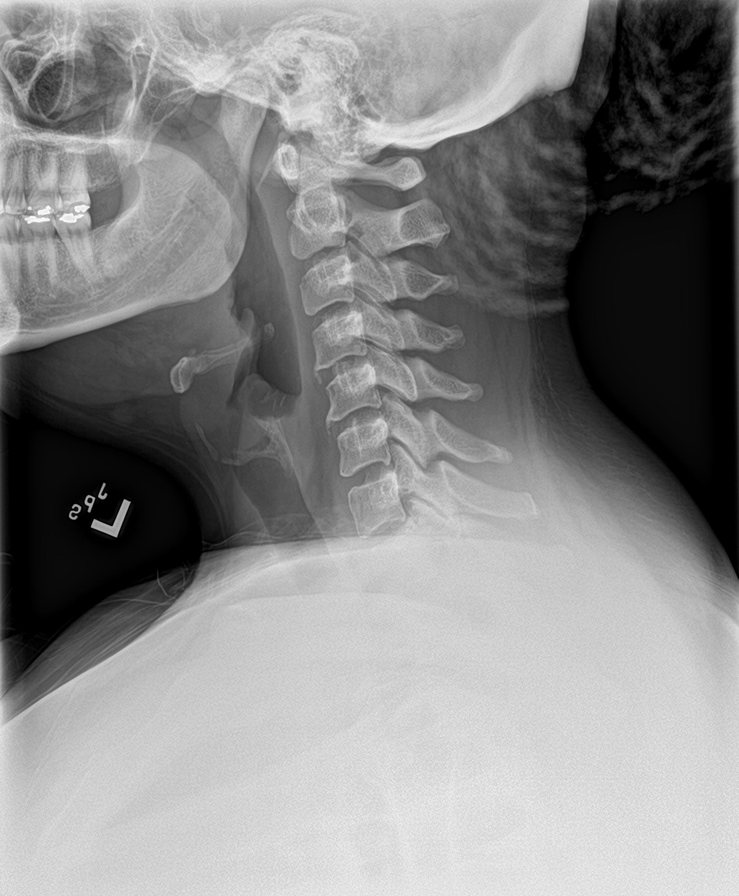

[t-spine ap]
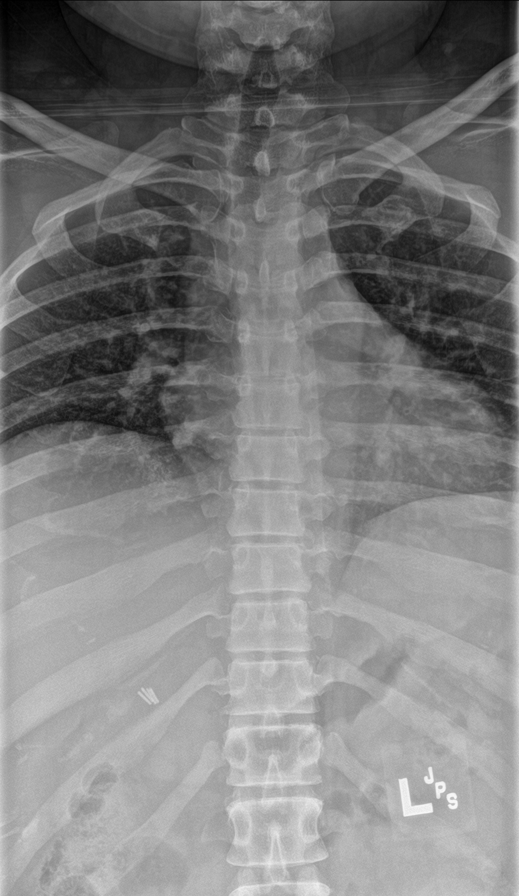

[10 of 10 positions shown; findings below may reference images not displayed]

FINDINGS: No focal lytic lesion.

No acute bony abnormality.  Normal bone mineralization.

Early anterior spurring within the cervical, thoracic and lumbar
spine.

Heart is normal size.  Lungs are clear.  No effusions.
IMPRESSION: No acute bony abnormality or focal suspicious lytic lesion.
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
## 2023-01-10 ENCOUNTER — Other Ambulatory Visit: Payer: Self-pay

## 2023-01-10 ENCOUNTER — Ambulatory Visit (INDEPENDENT_AMBULATORY_CARE_PROVIDER_SITE_OTHER): Payer: No Typology Code available for payment source | Admitting: Physical Therapy

## 2023-01-10 ENCOUNTER — Encounter: Payer: Self-pay | Admitting: Physical Therapy

## 2023-01-10 DIAGNOSIS — M5459 Other low back pain: Secondary | ICD-10-CM

## 2023-01-10 DIAGNOSIS — M542 Cervicalgia: Secondary | ICD-10-CM | POA: Diagnosis not present

## 2023-01-10 DIAGNOSIS — M546 Pain in thoracic spine: Secondary | ICD-10-CM

## 2023-01-11 ENCOUNTER — Other Ambulatory Visit: Payer: No Typology Code available for payment source

## 2023-01-11 ENCOUNTER — Encounter: Payer: Self-pay | Admitting: Gastroenterology

## 2023-01-11 ENCOUNTER — Ambulatory Visit: Payer: No Typology Code available for payment source | Admitting: Gastroenterology

## 2023-01-11 VITALS — BP 126/74 | Temp 84.0°F | Ht 64.0 in | Wt 263.5 lb

## 2023-01-11 DIAGNOSIS — R1011 Right upper quadrant pain: Secondary | ICD-10-CM | POA: Diagnosis not present

## 2023-01-11 DIAGNOSIS — Z1211 Encounter for screening for malignant neoplasm of colon: Secondary | ICD-10-CM

## 2023-01-11 DIAGNOSIS — K760 Fatty (change of) liver, not elsewhere classified: Secondary | ICD-10-CM | POA: Diagnosis not present

## 2023-01-11 NOTE — Progress Notes (Signed)
Discussed the use of AI scribe software for clinical note transcription with the patient, who gave verbal consent to proceed.  HPI : Heather Campos is a 47 y.o. female with a history of hypothyroid, MGUS, obesity and anxiety who is referred to [place] by Rema Fendt, NP for further evaluation of fatty liver.   The patient reported experiencing persistent pain in the right flank/back, sometimes radiating to the right upper quadrant, prompting a CT renal stone, and subsequently the renal ultrasound. The pain is not associated with meals or physical activity and is present even at rest.  The patient has been experiencing this discomfort for several months, and it has not improved or worsened over time. The patient denies any other gastrointestinal symptoms, such as changes in bowel habits or blood in the stool. However, she did report a single episode of rectal bleeding last week, which she attributed to straining and a history of hemorrhoids.  The patient has a sedentary lifestyle and admits to a diet high in processed foods and sugars.  She drinks soda several days a week.  She does not consume alcohol. She has been prediabetic for several months. The patient also reported a recent weight gain, which she attributes to her poor diet.  The patient has a family history of sickle cell trait but denies any family history of liver disease. She had a cholecystectomy approximately seven years ago. The patient is motivated to make lifestyle changes, including dietary modifications and increased physical activity, to improve her liver health and prevent progression to cirrhosis.     Renal ultrasound September 22, 2022 IMPRESSION: 1. No hydronephrosis. 2. Increased hepatic echogenicity as can be seen in hepatic steatosis.   CT abdomen pelvis 2021 IMPRESSION: No acute abnormality.   Tethering of the anterosuperior bladder wall to the ventral abdominal wall likely from adhesions. This is of unclear  clinical Significance  Personal review of images show normal appearance of hepatic parenchyma  Past Medical History:  Diagnosis Date   Allergic rhinitis    Anxiety    Chronic back pain    Family history of systemic lupus erythematosus    GERD (gastroesophageal reflux disease)    Hypothyroidism due to Hashimoto's thyroiditis    IDA (iron deficiency anemia)    Morbid obesity (HCC)    OSA (obstructive sleep apnea)    No CPAP   Vitamin D deficiency      Past Surgical History:  Procedure Laterality Date   ABDOMINAL HYSTERECTOMY     CESAREAN SECTION     CHOLECYSTECTOMY     CYSTOSCOPY  07/04/2019   Procedure: CYSTOSCOPY;  Surgeon: Essie Hart, MD;  Location: [facility];  Service: Gynecology;;   HYSTERECTOMY ABDOMINAL WITH SALPINGECTOMY Bilateral 07/04/2019   Procedure: ABDOMINAL  suprcervical hysterctomyWITH SALPINGECTOMY ;  Surgeon: Essie Hart, MD;  Location: [facility];  Service: Gynecology;  Laterality: Bilateral;   TOTAL LAPAROSCOPIC HYSTERECTOMY WITH SALPINGECTOMY Bilateral 07/04/2019   Procedure: attemted TOTAL LAPAROSCOPIC HYSTERECTOMY WITH SALPINGECTOMY;  Surgeon: Essie Hart, MD;  Location: [facility];  Service: Gynecology;  Laterality: Bilateral;  3 hours   Family History  Problem Relation Age of Onset   Lupus Mother    Kidney disease Mother    Lupus Cousin    Social History   Tobacco Use   Smoking status: Former    Passive exposure: Past   Smokeless tobacco: Never  Vaping Use   Vaping status: Never Used  Substance Use Topics  Alcohol use: No   Drug use: No   Current Outpatient Medications  Medication Sig Dispense Refill   Butalbital-APAP-Caffeine 50-300-40 MG CAPS Take 1 tablet by mouth every 6 (six) hours as needed. 40 capsule 0   clindamycin (CLEOCIN T) 1 % lotion Apply 1 application topically daily.      clindamycin (CLEOCIN T) 1 % lotion Apply to affected area daily as directed 60 mL 3   diazepam (VALIUM) 5  MG tablet Take 1 tablet 30-40 minutes prior to procedure 1 tablet 0   HYDROcodone-acetaminophen (NORCO) 10-325 MG tablet Take 1 tablet by mouth every 8 (eight) hours as needed. 60 tablet 0   ibuprofen (ADVIL) 800 MG tablet Take 1 tablet by mouth 3 times a day as needed. 90 tablet 2   levothyroxine (SYNTHROID) 137 MCG tablet Take 1 tablet (137 mcg total) by mouth daily. 90 tablet 0   meloxicam (MOBIC) 15 MG tablet Take 1 tablet (15 mg total) by mouth daily as needed. 15 tablet 0   metFORMIN (GLUCOPHAGE) 500 MG tablet Take 2 tablets (1,000 mg total) by mouth 2 (two) times daily with a meal. 120 tablet 1   methocarbamol (ROBAXIN) 500 MG tablet Take 1 tablet (500 mg total) by mouth 2 (two) times daily as needed for muscle spasms. 20 tablet 0   omeprazole (PRILOSEC) 40 MG capsule Take 1 capsule (40 mg total) by mouth daily as needed. 90 capsule 0   Vitamin D, Ergocalciferol, (DRISDOL) 1.25 MG (50000 UNIT) CAPS capsule Take 1 capsule (50,000 Units total) by mouth every 7 (seven) days. 4 capsule 2   No current facility-administered medications for this visit.   Allergies  Allergen Reactions   No Known Allergies      Review of Systems: All systems reviewed and negative except where noted in HPI.    No results found.  Physical Exam: BP 126/74   Temp (!) 84 F (28.9 C)   Ht 5\' 4"  (1.626 m)   Wt 263 lb 8 oz (119.5 kg)   LMP 06/24/2019   BMI 45.23 kg/m  Constitutional: Pleasant,well-developed, African-American female in no acute distress. HEENT: Normocephalic and atraumatic. Conjunctivae are normal. No scleral icterus. Neck supple.  Cardiovascular: Normal rate, regular rhythm.  Pulmonary/chest: Effort normal and breath sounds normal. No wheezing, rales or rhonchi. Abdominal: Soft, nondistended, nontender. Bowel sounds active throughout. There are no masses palpable. No hepatomegaly. Back: Focal tenderness to palpation in the right subscapular region Extremities: no edema Lymphadenopathy:  No cervical adenopathy noted. Neurological: Alert and oriented to person place and time. Skin: Skin is warm and dry. No rashes noted. Psychiatric: Normal mood and affect. Behavior is normal.  CBC    Component Value Date/Time   WBC 12.0 (H) 11/29/2022 0744   RBC 4.34 11/29/2022 0744   HGB 12.2 11/29/2022 0753   HGB 13.1 05/10/2022 0827   HGB 13.0 05/27/2019 0850   HCT 36.0 11/29/2022 0753   HCT 39.9 05/27/2019 0850   PLT 270 11/29/2022 0744   PLT 275 05/10/2022 0827   PLT 343 05/27/2019 0850   MCV 83.9 11/29/2022 0744   MCV 80 05/27/2019 0850   MCH 27.9 11/29/2022 0744   MCHC 33.2 11/29/2022 0744   RDW 13.7 11/29/2022 0744   RDW 18.2 (H) 05/27/2019 0850   LYMPHSABS 5.3 (H) 11/29/2022 0744   LYMPHSABS 2.8 05/27/2019 0850   MONOABS 0.8 11/29/2022 0744   EOSABS 0.5 11/29/2022 0744   EOSABS 0.2 05/27/2019 0850   BASOSABS 0.0 11/29/2022 0744  BASOSABS 0.0 05/27/2019 0850    CMP     Component Value Date/Time   NA 139 11/29/2022 0753   NA 139 11/17/2021 1528   K 3.8 11/29/2022 0753   CL 106 11/29/2022 0753   CO2 23 09/09/2022 1039   GLUCOSE 105 (H) 11/29/2022 0753   BUN 7 11/29/2022 0753   BUN 8 11/17/2021 1528   CREATININE 0.80 11/29/2022 0753   CREATININE 0.74 05/10/2022 0827   CALCIUM 8.8 (L) 09/09/2022 1039   PROT 7.3 09/09/2022 1039   PROT 7.1 03/14/2018 0922   ALBUMIN 3.7 09/09/2022 1039   ALBUMIN 4.4 12/26/2019 1135   AST 20 09/09/2022 1039   AST 28 05/10/2022 0827   ALT 22 09/09/2022 1039   ALT 28 05/10/2022 0827   ALKPHOS 65 09/09/2022 1039   BILITOT 0.7 09/09/2022 1039   BILITOT 0.4 05/10/2022 0827   GFRNONAA >60 09/09/2022 1039   GFRNONAA >60 05/10/2022 0827   GFRAA 117 12/26/2019 1135       Latest Ref Rng & Units 11/29/2022    7:53 AM 11/29/2022    7:44 AM 09/09/2022   10:39 AM  CBC EXTENDED  WBC 4.0 - 10.5 K/uL  12.0  8.4   RBC 3.87 - 5.11 MIL/uL  4.34  4.55   Hemoglobin 12.0 - 15.0 g/dL 16.1  09.6  04.5   HCT 36.0 - 46.0 % 36.0  36.4  38.4    Platelets 150 - 400 K/uL  270  238   NEUT# 1.7 - 7.7 K/uL  5.4  3.8   Lymph# 0.7 - 4.0 K/uL  5.3  3.9      Fibrosis 4 Score = .71 (Low risk)        Interpretation for patients with NAFLD          <1.30       -  F0-F1 (Low risk)          1.30-2.67 -  Indeterminate           >2.67      -  F3-F4 (High risk)     Validated for ages 23-65    ASSESSMENT AND PLAN:  47 year old female with obesity and prediabetes, with chronic right-sided flank/back pain, incidentally found to have fatty liver on ultrasound.  She has had normal liver enzymes.  She does not drink alcohol.  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    Metabolic associated steatotic liver disease (MASLD) MASLD identified incidentally during a kidney ultrasound in August. Reports right-sided pain under the breast radiating to the back. No alcohol use. Likely related to prediabetes and dietary habits. Liver enzymes are normal, and low risk fib 4 score, indicating low risk of significant liver damage. Discussed lifestyle changes, including diet and exercise, to reverse fatty liver changes and prevent progression to cirrhosis. Explained that while most with MASLD do not develop significant liver disease, some can progress to cirrhosis, which is irreversible and life-threatening. Emphasized dietary changes, reducing sugar intake, eliminating soda, increasing water consumption, and incorporating physical activity. - Order liver elastography to assess liver stiffness and potential scarring - Order blood tests to exclude hepatitis B, hepatitis C, and autoimmune liver disease - Provide dietary recommendations handout for fatty liver - Encourage lifestyle modifications including reducing sugar intake, eliminating soda, increasing water consumption, and incorporating physical activity   Musculoskeletal Pain Intermittent pain in the right side under the breast radiating to the back. Likely musculoskeletal in origin, not associated  with gastrointestinal symptoms. No  gallbladder present, reducing likelihood of biliary causes. Pain likely related to muscles or nerves, not the liver. - Refer to primary care for further evaluation and potential physical therapy or trigger point injections  General Health Maintenance Discussed colon cancer screening.  Patient expressed concern about colonoscopy discomfort. Explained that the procedure is not painful, but preparation can be unpleasant. Discussed stool-based test (Cologuard) as an alternative. If positive, a colonoscopy will be necessary.  The patient preferred to proceed with a stool based test.  I do not think having a single isolated episode of bright red blood per rectum would make her ineligible for stool based test. -Cologuard  Follow-up - Schedule follow-up appointment in one year - If liver elastography shows fibrosis, consider Rezdiffra and earlier follow-up.    Loda Bialas E. Tomasa Rand, MD [facility]     Rema Fendt, NP

## 2023-01-11 NOTE — Patient Instructions (Signed)
Your provider has requested that you go to the basement level for lab work before leaving today. Press "B" on the elevator. The lab is located at the first door on the left as you exit the elevator.  You have been scheduled for an abdominal ultrasound at [facility] (1st floor of hospital) on 01/18/23 at 9am. Please arrive 30 minutes prior to your appointment for registration. Make certain not to have anything to eat or drink 6 hours prior to your appointment. Should you need to reschedule your appointment, please contact radiology at [phone]. This test typically takes about 30 minutes to perform.   Your provider has ordered Cologuard testing as an option for colon cancer screening. This is performed by [employer] and may be out of network with your insurance. PRIOR to completing the test, it is YOUR responsibility to contact your insurance about covered benefits for this test. Your out of pocket expense could be anywhere from $0.00 to $649.00.   When you call to check coverage with your insurer, please provide the following information:   -The ONLY provider of Cologuard is Optician, dispensing  - CPT code for Cologuard is [phone].  [employer] NPI # [id number]  -[employer] Tax ID # [id number]   We have already sent your demographic and insurance information to [employer] (phone number [phone]) and they should contact you within the next week regarding your test. If you have not heard from them within the next week, please call our office at [phone].  Due to recent changes in healthcare laws, you may see the results of your imaging and laboratory studies on MyChart before your provider has had a chance to review them.  We understand that in some cases there may be results that are confusing or concerning to you. Not all laboratory results come back in the same time frame and the provider may be waiting for multiple results in  order to interpret others.  Please give [place] 48 hours in order for your provider to thoroughly review all the results before contacting the office for clarification of your results.    _______________________________________________________  If your blood pressure at your visit was 140/90 or greater, please contact your primary care physician to follow up on this.  _______________________________________________________  If you are age 48 or older, your body mass index should be between 23-30. Your Body mass index is 45.23 kg/m. If this is out of the aforementioned range listed, please consider follow up with your Primary Care Provider.  If you are age 59 or younger, your body mass index should be between 19-25. Your Body mass index is 45.23 kg/m. If this is out of the aformentioned range listed, please consider follow up with your Primary Care Provider.   ________________________________________________________  The [place] [facility] providers would like to encourage you to use [facility] to communicate with providers for non-urgent requests or questions.  Due to long hold times on the telephone, sending your provider a message by [facility] may be a faster and more efficient way to get a response.  Please allow 48 business hours for a response.  Please remember that this is for non-urgent requests.  _______________________________________________________   It was a pleasure to see you today!  Thank you for trusting me with your gastrointestinal care!    Scott E.Tomasa Rand, MD

## 2023-01-13 LAB — MITOCHONDRIAL ANTIBODIES: Mitochondrial M2 Ab, IgG: <=20 U (ref <=20.0)

## 2023-01-13 LAB — HEPATITIS C ANTIBODY: Hepatitis C Ab: NONREACTIVE

## 2023-01-13 LAB — ANTI-SMOOTH MUSCLE ANTIBODY, IGG: Actin (Smooth Muscle) Antibody (IGG): 21 U — ABNORMAL HIGH (ref <20)

## 2023-01-13 LAB — HEPATITIS B SURFACE ANTIGEN: Hepatitis B Surface Ag: NONREACTIVE

## 2023-01-16 ENCOUNTER — Other Ambulatory Visit (HOSPITAL_COMMUNITY): Payer: Self-pay

## 2023-01-16 NOTE — Progress Notes (Signed)
Heather Campos, The tests looking for other causes of chronic liver disease were essentially normal.  You had a very low level of an antibody that has been associated with autoimmune hepatitis.  However, at this very low level of detection, this antibody is very likely to be of any significance.  We have been having a lot of patients with this antibody detected in the blood recently, which may indicate an issue with the lab. I would not recommend pursuing a liver biopsy, which would normally be the next step in evaluation for this antibody.  Please institute the lifestyle changes that we discussed at your office visit. Await results of the ultrasound/elastography.  Will contact you when those results are available.

## 2023-01-17 ENCOUNTER — Other Ambulatory Visit (HOSPITAL_COMMUNITY): Payer: Self-pay

## 2023-01-17 MED ORDER — HYDROCODONE-ACETAMINOPHEN 10-325 MG PO TABS
ORAL_TABLET | ORAL | 0 refills | Status: DC
  Filled 2023-01-17: qty 60, 20d supply, fill #0

## 2023-01-18 ENCOUNTER — Ambulatory Visit (HOSPITAL_COMMUNITY): Payer: No Typology Code available for payment source

## 2023-01-25 ENCOUNTER — Ambulatory Visit (HOSPITAL_COMMUNITY): Admission: RE | Admit: 2023-01-25 | Payer: No Typology Code available for payment source | Source: Ambulatory Visit

## 2023-01-26 ENCOUNTER — Encounter: Payer: No Typology Code available for payment source | Admitting: Physical Therapy

## 2023-01-27 ENCOUNTER — Other Ambulatory Visit (HOSPITAL_COMMUNITY): Payer: Self-pay

## 2023-02-01 ENCOUNTER — Ambulatory Visit (HOSPITAL_COMMUNITY): Admission: RE | Admit: 2023-02-01 | Payer: No Typology Code available for payment source | Source: Ambulatory Visit

## 2023-02-02 ENCOUNTER — Encounter: Payer: No Typology Code available for payment source | Admitting: Physical Therapy

## 2023-02-02 ENCOUNTER — Telehealth: Payer: Self-pay | Admitting: Physical Therapy

## 2023-02-02 NOTE — Telephone Encounter (Signed)
Pt did not show for PT appointment today. They were contacted and informed of this and she states she forgot about this appointment and she is out of town with family member who is in hospital.  They were provided the date and time of their next appointment and she confirms she will be there. They were instructed to call us to let us know if they cannot make their appointment.  Ivery Quale, PT, DPT 02/02/23 9:07 AM

## 2023-02-09 ENCOUNTER — Other Ambulatory Visit (HOSPITAL_COMMUNITY): Payer: Self-pay

## 2023-02-09 ENCOUNTER — Encounter: Payer: No Typology Code available for payment source | Admitting: Physical Therapy

## 2023-02-09 ENCOUNTER — Ambulatory Visit (INDEPENDENT_AMBULATORY_CARE_PROVIDER_SITE_OTHER): Payer: No Typology Code available for payment source | Admitting: Family

## 2023-02-09 VITALS — BP 121/87 | HR 83 | Temp 98.2°F | Ht 64.0 in | Wt 259.4 lb

## 2023-02-09 DIAGNOSIS — Z0289 Encounter for other administrative examinations: Secondary | ICD-10-CM | POA: Diagnosis not present

## 2023-02-09 DIAGNOSIS — F40243 Fear of flying: Secondary | ICD-10-CM | POA: Diagnosis not present

## 2023-02-09 DIAGNOSIS — F419 Anxiety disorder, unspecified: Secondary | ICD-10-CM

## 2023-02-09 DIAGNOSIS — Z23 Encounter for immunization: Secondary | ICD-10-CM

## 2023-02-09 DIAGNOSIS — F32A Depression, unspecified: Secondary | ICD-10-CM | POA: Diagnosis not present

## 2023-02-09 MED ORDER — DIAZEPAM 5 MG PO TABS
ORAL_TABLET | ORAL | 0 refills | Status: AC
  Filled 2023-02-09: qty 2, 1d supply, fill #0

## 2023-02-09 NOTE — Progress Notes (Signed)
Patient ID: [name], female    DOB: 08-08-1975  MRN: [id number]  CC: FMLA Paperwork   Subjective: Heather Campos is a 47 y.o. female who presents for FMLA paperwork.   Her concerns today include:  - FMLA paperwork completion for employer related to anxiety depression. She is an Human resources officer at [employer].  - Reports she will be going to [place] on 02/17/2023 and needs refill of Diazepam. Patient aware Diazepam will not be available for pickup from pharmacy until 02/13/2023. Patient verbalized understanding/agreeable. Patient requests [facility].  Patient Active Problem List   Diagnosis Date Noted   Prediabetes 05/25/2022   Anxiety and depression 12/24/2021   Cerumen debris on tympanic membrane of left ear 09/30/2021   Hypothyroidism 08/20/2021   Chronic pain 08/17/2021   Lumbar spondylosis 06/29/2021   Bilateral carpal tunnel syndrome 06/23/2021   Pain in joint of left knee 02/04/2021   COVID-19 11/27/2020   Class 3 severe obesity due to excess calories without serious comorbidity with body mass index (BMI) of 40.0 to 44.9 in adult [facility]) 11/27/2020   Pain in joint of right shoulder 10/06/2020   Calcific tendinitis of right shoulder 10/06/2020   Low back pain 08/13/2020   Insomnia 04/14/2020   Pain of toe of right foot 02/24/2020   Stiffness of left shoulder joint 11/14/2019   Adhesive capsulitis of left shoulder 10/02/2019   Shoulder pain, left 09/03/2019   Calcific tendinitis of left shoulder 09/03/2019   Fibroid uterus 07/04/2019   Uterine fibroid 07/04/2019   Tachycardia 05/14/2019   History of COVID-19 05/13/2019   Hair loss 05/13/2019   OSA (obstructive sleep apnea) 02/09/2016   Family history of systemic lupus erythematosus 02/09/2016   Vitamin D deficiency 09/02/2015   Morbid obesity due to excess calories (HCC) 09/02/2015   Iron deficiency anemia due to chronic blood loss 09/02/2015   Hypothyroidism due to Hashimoto's thyroiditis  09/02/2015     Current Outpatient Medications on File Prior to Visit  Medication Sig Dispense Refill   clindamycin (CLEOCIN T) 1 % lotion Apply to affected area daily as directed 60 mL 3   diazepam (VALIUM) 5 MG tablet Take 1 tablet 30-40 minutes prior to procedure 1 tablet 0   HYDROcodone-acetaminophen (NORCO) 10-325 MG tablet Take 1 tablet by mouth every 8 (eight) hours as needed. 60 tablet 0   levothyroxine (SYNTHROID) 137 MCG tablet Take 1 tablet (137 mcg total) by mouth daily. 90 tablet 0   meloxicam (MOBIC) 15 MG tablet Take 1 tablet (15 mg total) by mouth daily as needed. 15 tablet 0   metFORMIN (GLUCOPHAGE) 500 MG tablet Take 2 tablets (1,000 mg total) by mouth 2 (two) times daily with a meal. 120 tablet 1   omeprazole (PRILOSEC) 40 MG capsule Take 1 capsule (40 mg total) by mouth daily as needed. 90 capsule 0   Vitamin D, Ergocalciferol, (DRISDOL) 1.25 MG (50000 UNIT) CAPS capsule Take 1 capsule (50,000 Units total) by mouth every 7 (seven) days. 4 capsule 2   [DISCONTINUED] escitalopram (LEXAPRO) 10 MG tablet Take 1 tablet (10 mg total) by mouth daily. 90 tablet 0   No current facility-administered medications on file prior to visit.    Allergies  Allergen Reactions   No Known Allergies     Social History   Socioeconomic History   Marital status: Married    Spouse name: Not on file   Number of children: Not on file   Years of education: Not on file  Highest education level: Some college, no degree  Occupational History   Not on file  Tobacco Use   Smoking status: Former    Passive exposure: Past   Smokeless tobacco: Never  Vaping Use   Vaping status: Never Used  Substance and Sexual Activity   Alcohol use: No   Drug use: No   Sexual activity: Yes    Birth control/protection: Surgical  Other Topics Concern   Not on file  Social History Narrative   Not on file   Social Drivers of Health   Financial Resource Strain: Low Risk  (02/06/2023)   Overall  Financial Resource Strain (CARDIA)    Difficulty of Paying Living Expenses: Not hard at all  Food Insecurity: No Food Insecurity (02/06/2023)   Hunger Vital Sign    Worried About Running Out of Food in the Last Year: Never true    Ran Out of Food in the Last Year: Never true  Transportation Needs: No Transportation Needs (02/06/2023)   PRAPARE - Administrator, Civil Service (Medical): No    Lack of Transportation (Non-Medical): No  Physical Activity: Insufficiently Active (02/06/2023)   Exercise Vital Sign    Days of Exercise per Week: 1 day    Minutes of Exercise per Session: 10 min  Stress: Stress Concern Present (02/06/2023)   [employer] of Occupational Health - Occupational Stress Questionnaire    Feeling of Stress : To some extent  Social Connections: Socially Integrated (02/06/2023)   Social Connection and Isolation Panel [NHANES]    Frequency of Communication with Friends and Family: More than three times a week    Frequency of Social Gatherings with Friends and Family: Three times a week    Attends Religious Services: More than 4 times per year    Active Member of Clubs or Organizations: Yes    Attends Banker Meetings: More than 4 times per year    Marital Status: Married  Catering manager Violence: Not on file    Family History  Problem Relation Age of Onset   Lupus Mother    Kidney disease Mother    Lupus Cousin     Past Surgical History:  Procedure Laterality Date   ABDOMINAL HYSTERECTOMY     CESAREAN SECTION     CHOLECYSTECTOMY     CYSTOSCOPY  07/04/2019   Procedure: CYSTOSCOPY;  Surgeon: Essie Hart, MD;  Location: [facility];  Service: Gynecology;;   HYSTERECTOMY ABDOMINAL WITH SALPINGECTOMY Bilateral 07/04/2019   Procedure: ABDOMINAL  suprcervical hysterctomyWITH SALPINGECTOMY ;  Surgeon: Essie Hart, MD;  Location: [facility];  Service: Gynecology;  Laterality: Bilateral;   TOTAL LAPAROSCOPIC  HYSTERECTOMY WITH SALPINGECTOMY Bilateral 07/04/2019   Procedure: attemted TOTAL LAPAROSCOPIC HYSTERECTOMY WITH SALPINGECTOMY;  Surgeon: Essie Hart, MD;  Location: [facility];  Service: Gynecology;  Laterality: Bilateral;  3 hours    ROS: Review of Systems Negative except as stated above  PHYSICAL EXAM: BP 121/87   Pulse 83   Temp 98.2 F (36.8 C) (Oral)   Ht 5\' 4"  (1.626 m)   Wt 259 lb 6.4 oz (117.7 kg)   LMP 06/24/2019   SpO2 95%   BMI 44.53 kg/m   Physical Exam HENT:     Head: Normocephalic and atraumatic.     Nose: Nose normal.     Mouth/Throat:     Mouth: Mucous membranes are moist.     Pharynx: Oropharynx is clear.  Eyes:     Extraocular Movements:  Extraocular movements intact.     Conjunctiva/sclera: Conjunctivae normal.     Pupils: Pupils are equal, round, and reactive to light.  Cardiovascular:     Rate and Rhythm: Normal rate and regular rhythm.     Pulses: Normal pulses.     Heart sounds: Normal heart sounds.  Pulmonary:     Effort: Pulmonary effort is normal.     Breath sounds: Normal breath sounds.  Musculoskeletal:        General: Normal range of motion.     Cervical back: Normal range of motion and neck supple.  Neurological:     General: No focal deficit present.     Mental Status: She is alert and oriented to person, place, and time.  Psychiatric:        Mood and Affect: Mood normal.        Behavior: Behavior normal.     ASSESSMENT AND PLAN: 1. Encounter for completion of form with patient (Primary) 2. Anxiety and depression - FMLA paperwork completed today in office.   3. Fear of flying - Diazepam as prescribed. Counseled on medication adherence/adverse effects.  - Follow-up with primary provider as scheduled.  - diazepam (VALIUM) 5 MG tablet; Take 1 tablet (5 mg total) by mouth every 12 (twelve) hours as needed for up to 2 doses for anxiety.  Dispense: 2 tablet; Refill: 0  4. Encounter for immunization - Administered.  -  Flu vaccine trivalent PF, 6mos and older(Flulaval,Afluria,Fluarix,Fluzone)    Patient was given the opportunity to ask questions.  Patient verbalized understanding of the plan and was able to repeat key elements of the plan. Patient was given clear instructions to go to Emergency Department or return to medical center if symptoms don't improve, worsen, or new problems develop.The patient verbalized understanding.   Orders Placed This Encounter  Procedures   Flu vaccine trivalent PF, 6mos and older(Flulaval,Afluria,Fluarix,Fluzone)     Requested Prescriptions   Signed Prescriptions Disp Refills   diazepam (VALIUM) 5 MG tablet 2 tablet 0    Sig: Take 1 tablet (5 mg total) by mouth every 12 (twelve) hours as needed for up to 2 doses for anxiety.    Follow-up with primary provider as scheduled.   Rema Fendt, NP

## 2023-02-09 NOTE — Progress Notes (Signed)
Patient states no concerns to discuss.   Patient wants Flu vaccine.

## 2023-02-11 ENCOUNTER — Emergency Department (HOSPITAL_BASED_OUTPATIENT_CLINIC_OR_DEPARTMENT_OTHER)
Admission: EM | Admit: 2023-02-11 | Discharge: 2023-02-11 | Disposition: A | Discharge status: Home / Self Care | Payer: No Typology Code available for payment source | Attending: Emergency Medicine | Admitting: Emergency Medicine

## 2023-02-11 ENCOUNTER — Other Ambulatory Visit: Payer: Self-pay

## 2023-02-11 ENCOUNTER — Encounter (HOSPITAL_BASED_OUTPATIENT_CLINIC_OR_DEPARTMENT_OTHER): Payer: Self-pay

## 2023-02-11 ENCOUNTER — Emergency Department (HOSPITAL_COMMUNITY)
Admission: EM | Admit: 2023-02-11 | Discharge: 2023-02-11 | Discharge status: Against Medical Advice | Payer: No Typology Code available for payment source | Attending: Emergency Medicine | Admitting: Emergency Medicine

## 2023-02-11 ENCOUNTER — Emergency Department (HOSPITAL_BASED_OUTPATIENT_CLINIC_OR_DEPARTMENT_OTHER): Payer: No Typology Code available for payment source

## 2023-02-11 ENCOUNTER — Ambulatory Visit
Admission: EM | Admit: 2023-02-11 | Discharge: 2023-02-11 | Disposition: A | Discharge status: Other Acute Inpatient Hospital | Payer: No Typology Code available for payment source

## 2023-02-11 DIAGNOSIS — F419 Anxiety disorder, unspecified: Secondary | ICD-10-CM | POA: Insufficient documentation

## 2023-02-11 DIAGNOSIS — R42 Dizziness and giddiness: Secondary | ICD-10-CM | POA: Insufficient documentation

## 2023-02-11 DIAGNOSIS — Z202 Contact with and (suspected) exposure to infections with a predominantly sexual mode of transmission: Secondary | ICD-10-CM | POA: Diagnosis not present

## 2023-02-11 DIAGNOSIS — R519 Headache, unspecified: Secondary | ICD-10-CM | POA: Insufficient documentation

## 2023-02-11 DIAGNOSIS — E039 Hypothyroidism, unspecified: Secondary | ICD-10-CM | POA: Insufficient documentation

## 2023-02-11 DIAGNOSIS — R200 Anesthesia of skin: Secondary | ICD-10-CM | POA: Diagnosis not present

## 2023-02-11 DIAGNOSIS — R110 Nausea: Secondary | ICD-10-CM | POA: Diagnosis not present

## 2023-02-11 DIAGNOSIS — Z5321 Procedure and treatment not carried out due to patient leaving prior to being seen by health care provider: Secondary | ICD-10-CM | POA: Insufficient documentation

## 2023-02-11 LAB — BASIC METABOLIC PANEL
Anion gap: 12 (ref 5–15)
BUN: 8 mg/dL (ref 6–20)
CO2: 25 mmol/L (ref 22–32)
Calcium: 9.3 mg/dL (ref 8.9–10.3)
Chloride: 102 mmol/L (ref 98–111)
Creatinine, Ser: 0.79 mg/dL (ref 0.44–1.00)
GFR, Estimated: >60 mL/min (ref >60)
Glucose, Bld: 150 mg/dL — ABNORMAL HIGH (ref 70–99)
Potassium: 3.5 mmol/L (ref 3.5–5.1)
Sodium: 139 mmol/L (ref 135–145)

## 2023-02-11 LAB — CBC
HCT: 39 % (ref 36.0–46.0)
Hemoglobin: 13.2 g/dL (ref 12.0–15.0)
MCH: 27.5 pg (ref 26.0–34.0)
MCHC: 33.8 g/dL (ref 30.0–36.0)
MCV: 81.3 fL (ref 80.0–100.0)
Platelets: 335 10*3/uL (ref 150–400)
RBC: 4.8 MIL/uL (ref 3.87–5.11)
RDW: 13.1 % (ref 11.5–15.5)
WBC: 10.7 10*3/uL — ABNORMAL HIGH (ref 4.0–10.5)
nRBC: 0 % (ref 0.0–0.2)

## 2023-02-11 LAB — CBG MONITORING, ED: Glucose-Capillary: 77 mg/dL (ref 70–99)

## 2023-02-11 MED ORDER — DIAZEPAM 5 MG PO TABS
5.0000 mg | ORAL_TABLET | Freq: Once | ORAL | Status: AC
  Administered 2023-02-11: 5 mg via ORAL
  Filled 2023-02-11: qty 1

## 2023-02-11 NOTE — ED Provider Notes (Signed)
Patient presents to urgent care after leaving ED due to wait. She experieced tongue numbness that started suddenly earlier this morning. Her BP was notably high in ED. Recommended further evaluation in the ED for stat imaging- advised [facility] as patient does seem stable at this time- no headache, no lightheadedness. She is agreeable and will transport via POV.   Tomi Bamberger, PA-C 02/11/23 1527

## 2023-02-11 NOTE — Discharge Instructions (Signed)
Follow up with your doctor for recheck next week. Please return to the ED with any new or concerning symptoms at any time.

## 2023-02-11 NOTE — ED Triage Notes (Addendum)
Pt arrived via POV. C/o HA, nausea, dizziness, and tongue tingling since this AM. States tongue went numb for 3x min.  No focal deficits noted. Pt states they have not had notable PO intake the past few days.  AOx4

## 2023-02-11 NOTE — ED Notes (Signed)
She tells me her "dizziness is gone".

## 2023-02-11 NOTE — ED Notes (Signed)
EKG and Glucose done at ED.

## 2023-02-11 NOTE — ED Provider Notes (Signed)
[place] EMERGENCY DEPARTMENT AT [facility] Provider Note   CSN: [id number] Arrival date & time: 02/11/23  1544     History  No chief complaint on file.   Heather Campos is a 47 y.o. female.  Patient was at work today around noon when she felt generally flushed, "warm all over", with subsequent numb feeling of her entire tongue. No taste discrepancy. No swelling. She was able to speak and swallow normally. No dizziness, visual change, weakness. She went to [facility] who, per patient, found her blood pressure to be "through the roof". The wait was too long so she left and went to Urgent Care who sent her here. She states her tongue still feels numb. No nausea, vomiting, recent illness.   The history is provided by the patient. No language interpreter was used.       Home Medications Prior to Admission medications   Medication Sig Start Date End Date Taking? Authorizing Provider  clindamycin (CLEOCIN T) 1 % lotion Apply to affected area daily as directed 11/30/22     diazepam (VALIUM) 5 MG tablet Take 1 tablet 30-40 minutes prior to procedure 06/07/22   Drema Dallas, DO  diazepam (VALIUM) 5 MG tablet Take 1 tablet (5 mg total) by mouth every 12 (twelve) hours as needed for up to 2 doses for anxiety. 02/13/23   Rema Fendt, NP  HYDROcodone-acetaminophen (NORCO) 10-325 MG tablet Take 1 tablet by mouth every 8 (eight) hours as needed. 01/16/23     levothyroxine (SYNTHROID) 137 MCG tablet Take 1 tablet (137 mcg total) by mouth daily. 12/13/22 03/13/23  Rema Fendt, NP  meloxicam (MOBIC) 15 MG tablet Take 1 tablet (15 mg total) by mouth daily as needed. 09/12/22   Harlene Salts A, PA-C  metFORMIN (GLUCOPHAGE) 500 MG tablet Take 2 tablets (1,000 mg total) by mouth 2 (two) times daily with a meal. 11/30/22     metFORMIN (GLUCOPHAGE) 500 MG tablet Take 1,000 mg by mouth 2 (two) times daily with a meal.    [provider]  omeprazole (PRILOSEC) 40 MG capsule Take 1  capsule (40 mg total) by mouth daily as needed. 11/17/21 02/09/23  Rema Fendt, NP  Vitamin D, Ergocalciferol, (DRISDOL) 1.25 MG (50000 UNIT) CAPS capsule Take 1 capsule (50,000 Units total) by mouth every 7 (seven) days. 05/25/22   Rema Fendt, NP  escitalopram (LEXAPRO) 10 MG tablet Take 1 tablet (10 mg total) by mouth daily. 12/13/22 12/19/22  Rema Fendt, NP      Allergies    Patient has no known allergies.    Review of Systems   Review of Systems  Physical Exam Updated Vital Signs BP (!) 146/87   Pulse (!) 109   Temp 98.2 F (36.8 C)   Resp 17   LMP 06/24/2019   SpO2 100%  Physical Exam Vitals and nursing note reviewed.  Constitutional:      Appearance: Normal appearance. She is well-developed.  HENT:     Head: Normocephalic.  Cardiovascular:     Rate and Rhythm: Normal rate and regular rhythm.     Heart sounds: No murmur heard. Pulmonary:     Effort: Pulmonary effort is normal.     Breath sounds: Normal breath sounds. No wheezing, rhonchi or rales.  Abdominal:     General: Bowel sounds are normal.     Palpations: Abdomen is soft.     Tenderness: There is no abdominal tenderness. There is no guarding or rebound.  Musculoskeletal:        General: Normal range of motion.     Cervical back: Normal range of motion and neck supple.  Skin:    General: Skin is warm and dry.  Neurological:     General: No focal deficit present.     Mental Status: She is alert and oriented to person, place, and time.     GCS: GCS eye subscore is 4. GCS verbal subscore is 5. GCS motor subscore is 6.     Cranial Nerves: Cranial nerves 2-12 are intact. No cranial nerve deficit or facial asymmetry.     Sensory: Sensation is intact.     Motor: Motor function is intact. No abnormal muscle tone or pronator drift.     Coordination: Coordination is intact. Heel to [facility] Test normal.     Deep Tendon Reflexes:     Reflex Scores:      Patellar reflexes are 2+ on the right side and 2+ on the  left side.    Comments: Normal sensation of the tongue to light touch.     ED Results / Procedures / Treatments   Labs (all labs ordered are listed, but only abnormal results are displayed) Labs Reviewed  BASIC METABOLIC PANEL - Abnormal; Notable for the following components:      Result Value   Glucose, Bld 150 (*)    All other components within normal limits  CBC - Abnormal; Notable for the following components:   WBC 10.7 (*)    All other components within normal limits  CBG MONITORING, ED   Results for orders placed or performed during the hospital encounter of 02/11/23  Basic metabolic panel   Collection Time: 02/11/23  3:57 PM  Result Value Ref Range   Sodium 139 135 - 145 mmol/L   Potassium 3.5 3.5 - 5.1 mmol/L   Chloride 102 98 - 111 mmol/L   CO2 25 22 - 32 mmol/L   Glucose, Bld 150 (H) 70 - 99 mg/dL   BUN 8 6 - 20 mg/dL   Creatinine, Ser 9.14 0.44 - 1.00 mg/dL   Calcium 9.3 8.9 - 78.2 mg/dL   GFR, Estimated >95 >62 mL/min   Anion gap 12 5 - 15  CBC   Collection Time: 02/11/23  3:57 PM  Result Value Ref Range   WBC 10.7 (H) 4.0 - 10.5 K/uL   RBC 4.80 3.87 - 5.11 MIL/uL   Hemoglobin 13.2 12.0 - 15.0 g/dL   HCT 13.0 86.5 - 78.4 %   MCV 81.3 80.0 - 100.0 fL   MCH 27.5 26.0 - 34.0 pg   MCHC 33.8 30.0 - 36.0 g/dL   RDW 69.6 29.5 - 28.4 %   Platelets 335 150 - 400 K/uL   nRBC 0.0 0.0 - 0.2 %    EKG None  Radiology CT HEAD WO CONTRAST Result Date: 02/11/2023 CLINICAL DATA:  Headache, dizziness. EXAM: CT HEAD WITHOUT CONTRAST TECHNIQUE: Contiguous axial images were obtained from the base of the skull through the vertex without intravenous contrast. RADIATION DOSE REDUCTION: This exam was performed according to the departmental dose-optimization program which includes automated exposure control, adjustment of the mA and/or kV according to patient size and/or use of iterative reconstruction technique. COMPARISON:  CT head dated 11/29/2022. FINDINGS: Brain: No  evidence of acute infarction, hemorrhage, hydrocephalus, extra-axial collection or mass lesion/mass effect. Vascular: No hyperdense vessel or unexpected calcification. Skull: Normal. Negative for fracture or focal lesion. Sinuses/Orbits: No acute finding. Other: None. IMPRESSION: No  acute intracranial process. Electronically Signed   By: Romona Curls M.D.   On: 02/11/2023 16:33    Procedures Procedures    Medications Ordered in ED Medications  diazepam (VALIUM) tablet 5 mg (5 mg Oral Given 02/11/23 1655)    ED Course/ Medical Decision Making/ A&P                                 Medical Decision Making This patient presents to the ED for concern of numbness of tongue, this involves an extensive number of treatment options, and is a complaint that carries with it a high risk of complications and morbidity.  The differential diagnosis includes neurologic event, infection, anxiety   Co morbidities that complicate the patient evaluation  H/o anxiety, anemia, hypothyroid, GERD, OSA   Lab Tests:  I Ordered, and personally interpreted labs.  The pertinent results include:  Glucose slightly elevated at 150, otherwise no electrolyte abnormalities, normal renal function; WBC 10.7, normal hgb    Imaging Studies ordered:  I ordered imaging studies including CT head Per radiologist interpretation - no acute abnormality   Cardiac Monitoring:  The patient was maintained on a cardiac monitor.  I personally viewed and interpreted the cardiac monitored which showed an underlying rhythm of: sinus tachycardia   Medicines ordered and prescription drug management:  I ordered medication including Valium  for anxiety Reevaluation of the patient after these medicines showed that the patient improved I have reviewed the patients home medicines and have made adjustments as needed   Consultations Obtained:  I requested consultation with the ED attending, Dr. Elayne Snare,  and discussed lab and  imaging findings as well as pertinent plan - they recommend: Patient presented, work up dicussed, treatment plan outlined. Agrees with plan of care.    Problem List / ED Course:  Presents with a general flushed sensation, and numbness of entire tongue No sensation discrepancy on exam of the tongue. No swelling Normal neurologic exam otherwise She is mildly tachycardic, max 119 on the monitor, appears anxious. HR fluctuates to normal when engaged in reassurances. Normal CT head, normal labs Valium provided - will recheck.   Reevaluation:  After the interventions noted above, I reevaluated the patient and found that they have :improved   Social Determinants of Health:  Good family support   Disposition:  After consideration of the diagnostic results and the patients response to treatment, I feel that the patient would benefit from She can be discharged home, f/u PCP as needed. .   Amount and/or Complexity of Data Reviewed Labs: ordered. Radiology: ordered.  Risk Prescription drug management.           Final Clinical Impression(s) / ED Diagnoses Final diagnoses:  Anxiety    Rx / DC Orders ED Discharge Orders     None         Elpidio Anis, PA-C 02/11/23 1757    Royanne Foots, DO 02/13/23 0024

## 2023-02-11 NOTE — ED Notes (Signed)
Patient is being discharged from the Urgent Care and sent to the Emergency Department via POV . Per RM, patient is in need of higher level of care due to tongue numbness. Patient is aware and verbalizes understanding of plan of care.  Vitals:   02/11/23 1514  BP: 139/87  Pulse: (!) 102  Resp: 18  Temp: 98.9 F (37.2 C)  SpO2: 97%

## 2023-02-11 NOTE — ED Triage Notes (Signed)
Pt c/o "possible stroke I guess." Advises she was sitting at work, "all of a sudden my tongue went numb, I started panicking. Went to the hospital, & they sent me back to the lobby. I told them I needed to be seen immediately so I left & went to UC. They told me to come straight here." Pt advises that symptoms "have gotten a little better, but I still have a HA." Nausea, no vomiting, no blurred vision, no weakness, no other symptoms

## 2023-02-11 NOTE — ED Triage Notes (Signed)
"  I was sitting at work today and felt a rush come over me, tongue became numb (for approximately 3+ mins), dizziness. Went to ED immediately after, had Triage & EKG and sent back to waiting area, couldn't do wait in waiting area "so let them know I am going to Urgent Care".   Currently "tongue feel's funny, not numb, no dizziness, no chest pain, no sob. ".

## 2023-02-13 ENCOUNTER — Telehealth: Payer: Self-pay | Admitting: Physical Therapy

## 2023-02-13 ENCOUNTER — Other Ambulatory Visit (HOSPITAL_COMMUNITY): Payer: Self-pay

## 2023-02-13 ENCOUNTER — Encounter: Payer: No Typology Code available for payment source | Admitting: Physical Therapy

## 2023-02-13 NOTE — Telephone Encounter (Signed)
Pt did not show for PT appointment today and I called her and left voicemail about this. This was 2nd no show in a row so due to attendance policy I did have to remove her remaining visit. She will need to call out office back to reschedule if she still needs PT and she can set up one visit at a time due to policy.   Ivery Quale, PT, DPT 02/13/23 8:31 AM

## 2023-02-15 ENCOUNTER — Encounter: Payer: Self-pay | Admitting: Family

## 2023-02-20 ENCOUNTER — Other Ambulatory Visit: Payer: Self-pay

## 2023-02-20 ENCOUNTER — Other Ambulatory Visit: Payer: Self-pay | Admitting: Family

## 2023-02-20 ENCOUNTER — Other Ambulatory Visit (HOSPITAL_COMMUNITY): Payer: Self-pay

## 2023-02-20 ENCOUNTER — Telehealth: Payer: Self-pay | Admitting: Family

## 2023-02-20 DIAGNOSIS — Z131 Encounter for screening for diabetes mellitus: Secondary | ICD-10-CM

## 2023-02-20 MED ORDER — METFORMIN HCL 500 MG PO TABS
1000.0000 mg | ORAL_TABLET | Freq: Two times a day (BID) | ORAL | 0 refills | Status: DC
  Filled 2023-02-20: qty 120, 30d supply, fill #0

## 2023-02-20 MED ORDER — METFORMIN HCL 500 MG PO TABS
1000.0000 mg | ORAL_TABLET | Freq: Two times a day (BID) | ORAL | 1 refills | Status: DC
  Filled 2023-02-20 – 2023-03-24 (×3): qty 120, 30d supply, fill #0
  Filled 2023-04-18: qty 120, 30d supply, fill #1

## 2023-02-20 MED ORDER — HYDROCODONE-ACETAMINOPHEN 10-325 MG PO TABS
1.0000 | ORAL_TABLET | Freq: Three times a day (TID) | ORAL | 0 refills | Status: DC
  Filled 2023-02-20: qty 60, 20d supply, fill #0

## 2023-02-20 NOTE — Telephone Encounter (Signed)
Schedule appointment?

## 2023-02-21 ENCOUNTER — Encounter: Payer: No Typology Code available for payment source | Admitting: Physical Therapy

## 2023-02-21 ENCOUNTER — Other Ambulatory Visit (HOSPITAL_COMMUNITY): Payer: Self-pay

## 2023-02-28 NOTE — Telephone Encounter (Signed)
 Revised FMLA form from 02/13/2023 available for review in Media.

## 2023-03-01 ENCOUNTER — Encounter (HOSPITAL_COMMUNITY): Payer: Self-pay

## 2023-03-01 ENCOUNTER — Ambulatory Visit (HOSPITAL_COMMUNITY)
Admission: EM | Admit: 2023-03-01 | Discharge: 2023-03-01 | Disposition: A | Discharge status: Home / Self Care | Payer: No Typology Code available for payment source | Source: Home / Self Care

## 2023-03-01 ENCOUNTER — Encounter (HOSPITAL_COMMUNITY): Payer: Self-pay | Admitting: Emergency Medicine

## 2023-03-01 ENCOUNTER — Emergency Department (HOSPITAL_COMMUNITY): Payer: No Typology Code available for payment source

## 2023-03-01 ENCOUNTER — Emergency Department (HOSPITAL_COMMUNITY)
Admission: EM | Admit: 2023-03-01 | Discharge: 2023-03-02 | Disposition: A | Discharge status: Home / Self Care | Payer: No Typology Code available for payment source | Attending: Emergency Medicine | Admitting: Emergency Medicine

## 2023-03-01 ENCOUNTER — Other Ambulatory Visit: Payer: Self-pay

## 2023-03-01 DIAGNOSIS — E039 Hypothyroidism, unspecified: Secondary | ICD-10-CM | POA: Diagnosis not present

## 2023-03-01 DIAGNOSIS — R109 Unspecified abdominal pain: Secondary | ICD-10-CM | POA: Insufficient documentation

## 2023-03-01 DIAGNOSIS — R1033 Periumbilical pain: Secondary | ICD-10-CM

## 2023-03-01 LAB — COMPREHENSIVE METABOLIC PANEL
ALT: 20 U/L (ref 0–44)
ALT: 21 U/L (ref 0–44)
AST: 19 U/L (ref 15–41)
AST: 21 U/L (ref 15–41)
Albumin: 3.7 g/dL (ref 3.5–5.0)
Albumin: 3.9 g/dL (ref 3.5–5.0)
Alkaline Phosphatase: 72 U/L (ref 38–126)
Alkaline Phosphatase: 72 U/L (ref 38–126)
Anion gap: 11 (ref 5–15)
Anion gap: 10 (ref 5–15)
BUN: 9 mg/dL (ref 6–20)
BUN: 8 mg/dL (ref 6–20)
CO2: 25 mmol/L (ref 22–32)
CO2: 26 mmol/L (ref 22–32)
Calcium: 9.5 mg/dL (ref 8.9–10.3)
Calcium: 9.5 mg/dL (ref 8.9–10.3)
Chloride: 101 mmol/L (ref 98–111)
Chloride: 101 mmol/L (ref 98–111)
Creatinine, Ser: 0.85 mg/dL (ref 0.44–1.00)
Creatinine, Ser: 0.88 mg/dL (ref 0.44–1.00)
GFR, Estimated: >60 mL/min (ref >60)
GFR, Estimated: >60 mL/min (ref >60)
Glucose, Bld: 89 mg/dL (ref 70–99)
Glucose, Bld: 93 mg/dL (ref 70–99)
Potassium: 4.3 mmol/L (ref 3.5–5.1)
Potassium: 4.2 mmol/L (ref 3.5–5.1)
Sodium: 137 mmol/L (ref 135–145)
Sodium: 137 mmol/L (ref 135–145)
Total Bilirubin: 0.9 mg/dL (ref 0.0–1.2)
Total Bilirubin: 0.9 mg/dL (ref 0.0–1.2)
Total Protein: 7.6 g/dL (ref 6.5–8.1)
Total Protein: 8.1 g/dL (ref 6.5–8.1)

## 2023-03-01 LAB — URINALYSIS, ROUTINE W REFLEX MICROSCOPIC
Bilirubin Urine: NEGATIVE
Glucose, UA: NEGATIVE mg/dL
Hgb urine dipstick: NEGATIVE
Ketones, ur: NEGATIVE mg/dL
Leukocytes,Ua: NEGATIVE
Nitrite: NEGATIVE
Protein, ur: NEGATIVE mg/dL
Specific Gravity, Urine: 1.01 (ref 1.005–1.030)
pH: 5 (ref 5.0–8.0)

## 2023-03-01 LAB — POCT URINALYSIS DIP (MANUAL ENTRY)
Bilirubin, UA: NEGATIVE
Blood, UA: NEGATIVE
Glucose, UA: NEGATIVE mg/dL
Ketones, POC UA: NEGATIVE mg/dL
Leukocytes, UA: NEGATIVE
Nitrite, UA: NEGATIVE
Protein Ur, POC: NEGATIVE mg/dL
Spec Grav, UA: 1.015 (ref 1.010–1.025)
Urobilinogen, UA: 0.2 U/dL
pH, UA: 6 (ref 5.0–8.0)

## 2023-03-01 LAB — CBC WITH DIFFERENTIAL/PLATELET
Abs Immature Granulocytes: 0.03 10*3/uL (ref 0.00–0.07)
Abs Immature Granulocytes: 0.05 10*3/uL (ref 0.00–0.07)
Basophils Absolute: 0 10*3/uL (ref 0.0–0.1)
Basophils Absolute: 0.1 10*3/uL (ref 0.0–0.1)
Basophils Relative: 0 %
Basophils Relative: 0 %
Eosinophils Absolute: 0.2 10*3/uL (ref 0.0–0.5)
Eosinophils Absolute: 0.2 10*3/uL (ref 0.0–0.5)
Eosinophils Relative: 2 %
Eosinophils Relative: 2 %
HCT: 38.9 % (ref 36.0–46.0)
HCT: 41.1 % (ref 36.0–46.0)
Hemoglobin: 12.9 g/dL (ref 12.0–15.0)
Hemoglobin: 13.6 g/dL (ref 12.0–15.0)
Immature Granulocytes: 0 %
Immature Granulocytes: 0 %
Lymphocytes Relative: 44 %
Lymphocytes Relative: 44 %
Lymphs Abs: 4.6 10*3/uL — ABNORMAL HIGH (ref 0.7–4.0)
Lymphs Abs: 5.3 10*3/uL — ABNORMAL HIGH (ref 0.7–4.0)
MCH: 27.4 pg (ref 26.0–34.0)
MCH: 27.4 pg (ref 26.0–34.0)
MCHC: 33.2 g/dL (ref 30.0–36.0)
MCHC: 33.1 g/dL (ref 30.0–36.0)
MCV: 82.6 fL (ref 80.0–100.0)
MCV: 82.9 fL (ref 80.0–100.0)
Monocytes Absolute: 0.7 10*3/uL (ref 0.1–1.0)
Monocytes Absolute: 0.9 10*3/uL (ref 0.1–1.0)
Monocytes Relative: 7 %
Monocytes Relative: 7 %
Neutro Abs: 4.8 10*3/uL (ref 1.7–7.7)
Neutro Abs: 5.6 10*3/uL (ref 1.7–7.7)
Neutrophils Relative %: 47 %
Neutrophils Relative %: 47 %
Platelets: 296 10*3/uL (ref 150–400)
Platelets: 288 10*3/uL (ref 150–400)
RBC: 4.71 MIL/uL (ref 3.87–5.11)
RBC: 4.96 MIL/uL (ref 3.87–5.11)
RDW: 13.2 % (ref 11.5–15.5)
RDW: 13.3 % (ref 11.5–15.5)
WBC: 10.4 10*3/uL (ref 4.0–10.5)
WBC: 12.1 10*3/uL — ABNORMAL HIGH (ref 4.0–10.5)
nRBC: 0 % (ref 0.0–0.2)
nRBC: 0 % (ref 0.0–0.2)

## 2023-03-01 LAB — LIPASE, BLOOD
Lipase: 25 U/L (ref 11–51)
Lipase: 25 U/L (ref 11–51)

## 2023-03-01 MED ORDER — IOHEXOL 350 MG/ML SOLN
75.0000 mL | Freq: Once | INTRAVENOUS | Status: AC | PRN
  Administered 2023-03-01: 75 mL via INTRAVENOUS

## 2023-03-01 NOTE — ED Provider Notes (Signed)
 [facility]    CSN: [id number] Arrival date & time: 03/01/23  1404      History   Chief Complaint Chief Complaint  Patient presents with   Abdominal Pain    HPI Heather Campos is a 48 y.o. female.   Patient presents to clinic with centralized lower abdominal pain below her umbilicus that has been intermittent since last week.  Overall she does have diminished appetite.  She has had a banana, a few bites of an apple and a yogurt today to take her medicines.  She is not had any fevers.  No nausea or vomiting.  No diarrhea or constipation.  Did have some pressure when she was urinating last week.  No urgency, frequency or dysuria.  Pain elicited with bending forward.  She was washing dishes last night and her stomach was up against the counter, this was painful.   Had a hysterectomy in 2021.  The history is provided by the patient and medical records.  Abdominal Pain   Past Medical History:  Diagnosis Date   Allergic rhinitis    Anxiety    Chronic back pain    Family history of systemic lupus erythematosus    GERD (gastroesophageal reflux disease)    Hypothyroidism due to Hashimoto's thyroiditis    IDA (iron deficiency anemia)    Morbid obesity (HCC)    OSA (obstructive sleep apnea)    No CPAP   Vitamin D  deficiency     Patient Active Problem List   Diagnosis Date Noted   Prediabetes 05/25/2022   Anxiety and depression 12/24/2021   Cerumen debris on tympanic membrane of left ear 09/30/2021   Hypothyroidism 08/20/2021   Chronic pain 08/17/2021   Lumbar spondylosis 06/29/2021   Bilateral carpal tunnel syndrome 06/23/2021   Pain in joint of left knee 02/04/2021   COVID-19 11/27/2020   Class 3 severe obesity due to excess calories without serious comorbidity with body mass index (BMI) of 40.0 to 44.9 in adult (HCC) 11/27/2020   Pain in joint of right shoulder 10/06/2020   Calcific tendinitis of right shoulder 10/06/2020   Low back pain 08/13/2020    Insomnia 04/14/2020   Pain of toe of right foot 02/24/2020   Stiffness of left shoulder joint 11/14/2019   Adhesive capsulitis of left shoulder 10/02/2019   Shoulder pain, left 09/03/2019   Calcific tendinitis of left shoulder 09/03/2019   Fibroid uterus 07/04/2019   Uterine fibroid 07/04/2019   Tachycardia 05/14/2019   History of COVID-19 05/13/2019   Hair loss 05/13/2019   OSA (obstructive sleep apnea) 02/09/2016   Family history of systemic lupus erythematosus 02/09/2016   Vitamin D  deficiency 09/02/2015   Morbid obesity due to excess calories (HCC) 09/02/2015   Iron deficiency anemia due to chronic blood loss 09/02/2015   Hypothyroidism due to Hashimoto's thyroiditis 09/02/2015    Past Surgical History:  Procedure Laterality Date   ABDOMINAL HYSTERECTOMY     CESAREAN SECTION     CHOLECYSTECTOMY     CYSTOSCOPY  07/04/2019   Procedure: CYSTOSCOPY;  Surgeon: Bettina Muskrat, MD;  Location: [facility];  Service: Gynecology;;   HYSTERECTOMY ABDOMINAL WITH SALPINGECTOMY Bilateral 07/04/2019   Procedure: ABDOMINAL  suprcervical hysterctomyWITH SALPINGECTOMY ;  Surgeon: Bettina Muskrat, MD;  Location: [facility];  Service: Gynecology;  Laterality: Bilateral;   TOTAL LAPAROSCOPIC HYSTERECTOMY WITH SALPINGECTOMY Bilateral 07/04/2019   Procedure: attemted TOTAL LAPAROSCOPIC HYSTERECTOMY WITH SALPINGECTOMY;  Surgeon: Bettina Muskrat, MD;  Location: [facility];  Service: Gynecology;  Laterality: Bilateral;  3 hours    OB History   No obstetric history on file.      Home Medications    Prior to Admission medications   Medication Sig Start Date End Date Taking? Authorizing Provider  clindamycin  (CLEOCIN  T) 1 % lotion Apply to affected area daily as directed 11/30/22     diazepam  (VALIUM ) 5 MG tablet Take 1 tablet (5 mg total) by mouth every 12 (twelve) hours as needed for up to 2 doses for anxiety. 02/13/23   Lorren Greig PARAS, NP   HYDROcodone -acetaminophen  (NORCO) 10-325 MG tablet Take 1 tablet by mouth every 8 (eight) hours as needed. 02/20/23     levothyroxine  (SYNTHROID ) 137 MCG tablet Take 1 tablet (137 mcg total) by mouth daily. 12/13/22 03/28/23  Lorren Greig PARAS, NP  meloxicam  (MOBIC ) 15 MG tablet Take 1 tablet (15 mg total) by mouth daily as needed. 09/12/22   Donah Riis A, PA-C  metFORMIN  (GLUCOPHAGE ) 500 MG tablet Take 2 tablets (1,000 mg total) by mouth 2 (two) times daily with a meal. 02/20/23   Lorren Greig PARAS, NP  omeprazole  (PRILOSEC) 40 MG capsule Take 1 capsule (40 mg total) by mouth daily as needed. 11/17/21 02/09/23  Lorren Greig PARAS, NP  Vitamin D , Ergocalciferol , (DRISDOL ) 1.25 MG (50000 UNIT) CAPS capsule Take 1 capsule (50,000 Units total) by mouth every 7 (seven) days. 05/25/22   Lorren Greig PARAS, NP  escitalopram  (LEXAPRO ) 10 MG tablet Take 1 tablet (10 mg total) by mouth daily. 12/13/22 12/19/22  Lorren Greig PARAS, NP    Family History Family History  Problem Relation Age of Onset   Lupus Mother    Kidney disease Mother    Lupus Cousin     Social History Social History   Tobacco Use   Smoking status: Never    Passive exposure: Past   Smokeless tobacco: Never  Vaping Use   Vaping status: Never Used  Substance Use Topics   Alcohol use: No   Drug use: No     Allergies   Patient has no known allergies.   Review of Systems Review of Systems  Per HPI   Physical Exam Triage Vital Signs ED Triage Vitals  Encounter Vitals Group     BP 03/01/23 1535 (!) 141/87     Systolic BP Percentile --      Diastolic BP Percentile --      Pulse Rate 03/01/23 1535 85     Resp 03/01/23 1535 16     Temp 03/01/23 1535 97.9 F (36.6 C)     Temp Source 03/01/23 1535 Oral     SpO2 03/01/23 1535 98 %     Weight 03/01/23 1536 261 lb (118.4 kg)     Height 03/01/23 1536 5' 4 (1.626 m)     Head Circumference --      Peak Flow --      Pain Score 03/01/23 1536 7     Pain Loc --      Pain  Education --      Exclude from Growth Chart --    No data found.  Updated Vital Signs BP (!) 141/87 (BP Location: Left Arm)   Pulse 85   Temp 97.9 F (36.6 C) (Oral)   Resp 16   Ht 5' 4 (1.626 m)   Wt 261 lb (118.4 kg)   LMP 06/24/2019   SpO2 98%   BMI 44.80 kg/m   Visual Acuity Right Eye Distance:  Left Eye Distance:   Bilateral Distance:    Right Eye Near:   Left Eye Near:    Bilateral Near:     Physical Exam Vitals and nursing note reviewed.  Constitutional:      Appearance: She is well-developed.  HENT:     Head: Normocephalic and atraumatic.     Mouth/Throat:     Mouth: Mucous membranes are moist.  Eyes:     General: No scleral icterus. Cardiovascular:     Rate and Rhythm: Normal rate.  Pulmonary:     Effort: Pulmonary effort is normal. No respiratory distress.  Abdominal:     General: Abdomen is flat. Bowel sounds are normal.     Palpations: Abdomen is soft.     Tenderness: There is abdominal tenderness in the suprapubic area. There is no guarding or rebound.     Hernia: No hernia is present.  Skin:    General: Skin is warm and dry.  Neurological:     General: No focal deficit present.     Mental Status: She is alert and oriented to person, place, and time.  Psychiatric:        Mood and Affect: Mood normal.        Behavior: Behavior normal.      UC Treatments / Results  Labs (all labs ordered are listed, but only abnormal results are displayed) Labs Reviewed  CBC WITH DIFFERENTIAL/PLATELET  COMPREHENSIVE METABOLIC PANEL  LIPASE, BLOOD  POCT URINALYSIS DIP (MANUAL ENTRY)    EKG   Radiology No results found.  Procedures Procedures (including critical care time)  Medications Ordered in UC Medications - No data to display  Initial Impression / Assessment and Plan / UC Course  I have reviewed the triage vital signs and the nursing notes.  Pertinent labs & imaging results that were available during my care of the patient were  reviewed by me and considered in my medical decision making (see chart for details).  Vitals and triage reviewed, patient is hemodynamically stable.  Abdomen is soft with active bowel sounds.  Has some tenderness around her suprapubic and epigastric area.  No rebound or guarding.  No palpated hernia.  Urinalysis is unremarkable.  Will draw some basic labs looking for electrolyte abnormalities or drastic white blood cell elevation.  Discussed following up with primary care if symptoms continue for potential outpatient imaging.  Discussed evolution that would require emergency department follow-up for advanced imaging as well.  Patient verbalized understanding with plan of care, no questions at this time.     Final Clinical Impressions(s) / UC Diagnoses   Final diagnoses:  Periumbilical abdominal pain     Discharge Instructions      It is unclear what is causing your abdominal pain.  Your urine does not show any signs of infection.  We have obtained some basic labs and I will contact you for urgent follow-up as needed.  Do not hesitate to seek further care at the nearest emergency department if you develop any worsening abdominal pain, vomiting or fever.  If your pain continues to be intermittent please follow-up with your primary care provider, who can consider outpatient imaging.      ED Prescriptions   None    I have reviewed the PDMP during this encounter.   Dreama, Adaya Garmany  N, FNP 03/01/23 [phone]

## 2023-03-01 NOTE — ED Provider Notes (Signed)
 [facility] Emergency Department Provider Note MRN:  [id number]  Arrival date & time: 03/01/23     Chief Complaint   Abdominal Pain   History of Present Illness   Heather Campos is a 48 y.o. year-old female with GERD, obesity presenting to the ED with chief complaint of abdominal pain.  Periumbilical abdominal pain for the past week.  Went away for a few days and then came back.  Has had decreased appetite.  Denies fever, no nausea vomiting or diarrhea, no vaginal bleeding or discharge.  Has had hysterectomy in the past.  Denies history of hernias.  Had some pressure with urination last week.  Review of Systems  A thorough review of systems was obtained and all systems are negative except as noted in the HPI and PMH.   Patient's Health History    Past Medical History:  Diagnosis Date   Allergic rhinitis    Anxiety    Chronic back pain    Family history of systemic lupus erythematosus    GERD (gastroesophageal reflux disease)    Hypothyroidism due to Hashimoto's thyroiditis    IDA (iron deficiency anemia)    Morbid obesity (HCC)    OSA (obstructive sleep apnea)    No CPAP   Vitamin D  deficiency     Past Surgical History:  Procedure Laterality Date   ABDOMINAL HYSTERECTOMY     CESAREAN SECTION     CHOLECYSTECTOMY     CYSTOSCOPY  07/04/2019   Procedure: CYSTOSCOPY;  Surgeon: Bettina Muskrat, MD;  Location: [facility];  Service: Gynecology;;   HYSTERECTOMY ABDOMINAL WITH SALPINGECTOMY Bilateral 07/04/2019   Procedure: ABDOMINAL  suprcervical hysterctomyWITH SALPINGECTOMY ;  Surgeon: Bettina Muskrat, MD;  Location: [facility];  Service: Gynecology;  Laterality: Bilateral;   TOTAL LAPAROSCOPIC HYSTERECTOMY WITH SALPINGECTOMY Bilateral 07/04/2019   Procedure: attemted TOTAL LAPAROSCOPIC HYSTERECTOMY WITH SALPINGECTOMY;  Surgeon: Bettina Muskrat, MD;  Location: [facility];  Service: Gynecology;  Laterality: Bilateral;  3  hours    Family History  Problem Relation Age of Onset   Lupus Mother    Kidney disease Mother    Lupus Cousin     Social History   Socioeconomic History   Marital status: Married    Spouse name: Not on file   Number of children: Not on file   Years of education: Not on file   Highest education level: Some college, no degree  Occupational History   Not on file  Tobacco Use   Smoking status: Never    Passive exposure: Past   Smokeless tobacco: Never  Vaping Use   Vaping status: Never Used  Substance and Sexual Activity   Alcohol use: No   Drug use: No   Sexual activity: Yes    Birth control/protection: Surgical  Other Topics Concern   Not on file  Social History Narrative   Not on file   Social Drivers of Health   Financial Resource Strain: Low Risk  (02/06/2023)   Overall Financial Resource Strain (CARDIA)    Difficulty of Paying Living Expenses: Not hard at all  Food Insecurity: No Food Insecurity (02/06/2023)   Hunger Vital Sign    Worried About Running Out of Food in the Last Year: Never true    Ran Out of Food in the Last Year: Never true  Transportation Needs: No Transportation Needs (02/06/2023)   PRAPARE - Administrator, Civil Service (Medical): No    Lack of Transportation (Non-Medical):  No  Physical Activity: Insufficiently Active (02/06/2023)   Exercise Vital Sign    Days of Exercise per Week: 1 day    Minutes of Exercise per Session: 10 min  Stress: Stress Concern Present (02/06/2023)   [employer] of Occupational Health - Occupational Stress Questionnaire    Feeling of Stress : To some extent  Social Connections: Socially Integrated (02/06/2023)   Social Connection and Isolation Panel [NHANES]    Frequency of Communication with Friends and Family: More than three times a week    Frequency of Social Gatherings with Friends and Family: Three times a week    Attends Religious Services: More than 4 times per year    Active Member of  Clubs or Organizations: Yes    Attends Banker Meetings: More than 4 times per year    Marital Status: Married  Catering Manager Violence: Not on file     Physical Exam   Vitals:   03/01/23 1948 03/01/23 2315  BP: 104/77 124/77  Pulse: 98 (!) 102  Resp: 14 15  Temp: 98.4 F (36.9 C) 98.4 F (36.9 C)  SpO2: 100% 99%    CONSTITUTIONAL: Well-appearing, NAD NEURO/PSYCH:  Alert and oriented x 3, no focal deficits EYES:  eyes equal and reactive ENT/NECK:  no LAD, no JVD CARDIO: Regular rate, well-perfused, normal S1 and S2 PULM:  CTAB no wheezing or rhonchi GI/GU:  non-distended, non-tender MSK/SPINE:  No gross deformities, no edema SKIN:  no rash, atraumatic   *Additional and/or pertinent findings included in MDM below  Diagnostic and Interventional Summary    EKG Interpretation Date/Time:    Ventricular Rate:    PR Interval:    QRS Duration:    QT Interval:    QTC Calculation:   R Axis:      Text Interpretation:         Labs Reviewed  CBC WITH DIFFERENTIAL/PLATELET - Abnormal; Notable for the following components:      Result Value   WBC 12.1 (*)    Lymphs Abs 5.3 (*)    All other components within normal limits  URINALYSIS, ROUTINE W REFLEX MICROSCOPIC - Abnormal; Notable for the following components:   Bacteria, UA RARE (*)    All other components within normal limits  COMPREHENSIVE METABOLIC PANEL  LIPASE, BLOOD    CT ABDOMEN PELVIS W CONTRAST  Final Result      Medications  iohexol  (OMNIPAQUE ) 350 MG/ML injection 75 mL (75 mLs Intravenous Contrast Given 03/01/23 2139)     Procedures  /  Critical Care Procedures  ED Course and Medical Decision Making  Initial Impression and Ddx Sent from urgent care for further testing, periumbilical abdominal pain with poor appetite, differential diagnosis includes appendicitis, UTI, hernia, overall doubt PID or STD or BV or yeast infection given the location of pain in the periumbilical region, no  vaginal irritation or discharge or bleeding.  Sitting comfortably in no acute distress with normal vital signs.  Past medical/surgical history that increases complexity of ED encounter: None  Interpretation of Diagnostics I personally reviewed the laboratory assessment and my interpretation is as follows: Minimal leukocytosis, otherwise no significant blood count or electrolyte disturbance  CT imaging unremarkable, normal appendix  Patient Reassessment and Ultimate Disposition/Management     Discharge.  Patient management required discussion with the following services or consulting groups:  None  Complexity of Problems Addressed Acute illness or injury that poses threat of life of bodily function  Additional Data Reviewed and Analyzed Further  history obtained from: Further history from spouse/family member  Additional Factors Impacting ED Encounter Risk None  Ozell HERO. Theadore, MD [facility] Emergency Medicine [facility] [email or web address]  Final Clinical Impressions(s) / ED Diagnoses     ICD-10-CM   1. Abdominal pain, unspecified abdominal location  R10.9       ED Discharge Orders     None        Discharge Instructions Discussed with and Provided to Patient:    Discharge Instructions      You were evaluated in the Emergency Department and after careful evaluation, we did not find any emergent condition requiring admission or further testing in the hospital.  Your exam/testing today was overall reassuring.  Symptoms may be due to a stomach bug or viral illness.  Recommend fluids, rest, anti-inflammatories, follow-up with primary care doctor if not going away over the next few days.  Please return to the Emergency Department if you experience any worsening of your condition.  Thank you for allowing us  to be a part of your care.       Theadore Ozell HERO, MD 03/02/23 0000

## 2023-03-01 NOTE — ED Triage Notes (Signed)
 Pt here for umbilical pain x 1 week, sent from UC for further imaging. Denies NVD, reports loss of appetite.

## 2023-03-01 NOTE — ED Triage Notes (Signed)
 Patient here today with c/o abd pain and loss of appetite since last week. Patient states that the pain is right around her belly button.

## 2023-03-01 NOTE — Discharge Instructions (Addendum)
 You were evaluated in the Emergency Department and after careful evaluation, we did not find any emergent condition requiring admission or further testing in the hospital.  Your exam/testing today was overall reassuring.  Symptoms may be due to a stomach bug or viral illness.  Recommend fluids, rest, anti-inflammatories, follow-up with primary care doctor if not going away over the next few days.  Please return to the Emergency Department if you experience any worsening of your condition.  Thank you for allowing us  to be a part of your care.

## 2023-03-01 NOTE — ED Provider Triage Note (Signed)
 Emergency Medicine Provider Triage Evaluation Note  Heather Campos , a 48 y.o. female  was evaluated in triage.  Pt complains of abdominal pain ongoing for about a week.  Endorses decreased appetite.  No nausea or vomiting.  No dysuria.  Review of Systems  Positive: As above Negative: As above  Physical Exam  BP (!) 140/95   Pulse 89   Temp 98.6 F (37 C)   Resp 18   Ht 5' 4 (1.626 m)   Wt 118.4 kg   LMP 06/24/2019   SpO2 97%   BMI 44.80 kg/m  Gen:   Awake, no distress   Resp:  Normal effort  MSK:   Moves extremities without difficulty  Other:    Medical Decision Making  Medically screening exam initiated at 5:31 PM.  Appropriate orders placed.  BRANDICE BUSSER was informed that the remainder of the evaluation will be completed by another provider, this initial triage assessment does not replace that evaluation, and the importance of remaining in the ED until their evaluation is complete.     Hildegard Loge, PA-C 03/01/23 1731

## 2023-03-01 NOTE — Discharge Instructions (Signed)
 It is unclear what is causing your abdominal pain.  Your urine does not show any signs of infection.  We have obtained some basic labs and I will contact you for urgent follow-up as needed.  Do not hesitate to seek further care at the nearest emergency department if you develop any worsening abdominal pain, vomiting or fever.  If your pain continues to be intermittent please follow-up with your primary care provider, who can consider outpatient imaging.

## 2023-03-09 LAB — COLOGUARD: COLOGUARD: NEGATIVE

## 2023-03-09 NOTE — Progress Notes (Signed)
Heather Campos, Your Cologuard test was negative.  You would be due for repeat colon cancer screening in 3 years.  You can either repeat a Cologuard, or proceed with a colonoscopy at that time.

## 2023-03-15 ENCOUNTER — Ambulatory Visit: Payer: No Typology Code available for payment source | Admitting: Family

## 2023-03-23 ENCOUNTER — Other Ambulatory Visit: Payer: Self-pay | Admitting: Family

## 2023-03-23 ENCOUNTER — Other Ambulatory Visit (HOSPITAL_COMMUNITY): Payer: Self-pay

## 2023-03-23 DIAGNOSIS — E039 Hypothyroidism, unspecified: Secondary | ICD-10-CM

## 2023-03-23 DIAGNOSIS — K219 Gastro-esophageal reflux disease without esophagitis: Secondary | ICD-10-CM

## 2023-03-24 ENCOUNTER — Other Ambulatory Visit: Payer: Self-pay

## 2023-03-24 ENCOUNTER — Telehealth: Payer: No Typology Code available for payment source | Admitting: Family Medicine

## 2023-03-24 ENCOUNTER — Other Ambulatory Visit (HOSPITAL_COMMUNITY): Payer: Self-pay

## 2023-03-24 DIAGNOSIS — K529 Noninfective gastroenteritis and colitis, unspecified: Secondary | ICD-10-CM | POA: Diagnosis not present

## 2023-03-24 MED ORDER — HYDROCODONE-ACETAMINOPHEN 10-325 MG PO TABS
1.0000 | ORAL_TABLET | Freq: Three times a day (TID) | ORAL | 0 refills | Status: DC | PRN
  Filled 2023-03-24: qty 60, 20d supply, fill #0

## 2023-03-24 MED ORDER — ONDANSETRON 4 MG PO TBDP
4.0000 mg | ORAL_TABLET | Freq: Three times a day (TID) | ORAL | 0 refills | Status: AC | PRN
  Filled 2023-03-24: qty 15, 5d supply, fill #0

## 2023-03-24 MED ORDER — LEVOTHYROXINE SODIUM 137 MCG PO TABS
137.0000 ug | ORAL_TABLET | Freq: Every day | ORAL | 0 refills | Status: DC
  Filled 2023-03-24 (×2): qty 30, 30d supply, fill #0
  Filled 2023-05-03: qty 30, 30d supply, fill #1
  Filled 2023-05-31: qty 30, 30d supply, fill #2

## 2023-03-24 NOTE — Patient Instructions (Signed)

## 2023-03-24 NOTE — Progress Notes (Signed)
Virtual Visit Consent   Heather Campos, you are scheduled for a virtual visit with a [facility] provider today. Just as with appointments in the office, your consent must be obtained to participate. Your consent will be active for this visit and any virtual visit you may have with one of our providers in the next 365 days. If you have a MyChart account, a copy of this consent can be sent to you electronically.  As this is a virtual visit, video technology does not allow for your provider to perform a traditional examination. This may limit your provider's ability to fully assess your condition. If your provider identifies any concerns that need to be evaluated in person or the need to arrange testing (such as labs, EKG, etc.), we will make arrangements to do so. Although advances in technology are sophisticated, we cannot ensure that it will always work on either your end or our end. If the connection with a video visit is poor, the visit may have to be switched to a telephone visit. With either a video or telephone visit, we are not always able to ensure that we have a secure connection.  By engaging in this virtual visit, you consent to the provision of healthcare and authorize for your insurance to be billed (if applicable) for the services provided during this visit. Depending on your insurance coverage, you may receive a charge related to this service.  I need to obtain your verbal consent now. Are you willing to proceed with your visit today? Heather Campos has provided verbal consent on 03/24/2023 for a virtual visit (video or telephone). Georgana Curio, FNP  Date: 03/24/2023 9:32 AM  Virtual Visit via Video Note   I, Georgana Curio, connected with  Heather Campos  ([id number], 01-09-1976) on 03/24/23 at  9:30 AM EST by a video-enabled telemedicine application and verified that I am speaking with the correct person using two identifiers.  Location: Patient: Virtual Visit Location Patient:  Home Provider: Virtual Visit Location Provider: Home Office   I discussed the limitations of evaluation and management by telemedicine and the availability of in person appointments. The patient expressed understanding and agreed to proceed.    History of Present Illness: Heather Campos is a 48 y.o. who identifies as a female who was assigned female at birth, and is being seen today for nausea, vomiting and diarrhea, Sx started yesterday. Marland Kitchen  HPI: HPI  Problems:  Patient Active Problem List   Diagnosis Date Noted   Prediabetes 05/25/2022   Anxiety and depression 12/24/2021   Cerumen debris on tympanic membrane of left ear 09/30/2021   Hypothyroidism 08/20/2021   Chronic pain 08/17/2021   Lumbar spondylosis 06/29/2021   Bilateral carpal tunnel syndrome 06/23/2021   Pain in joint of left knee 02/04/2021   COVID-19 11/27/2020   Class 3 severe obesity due to excess calories without serious comorbidity with body mass index (BMI) of 40.0 to 44.9 in adult (HCC) 11/27/2020   Pain in joint of right shoulder 10/06/2020   Calcific tendinitis of right shoulder 10/06/2020   Low back pain 08/13/2020   Insomnia 04/14/2020   Pain of toe of right foot 02/24/2020   Stiffness of left shoulder joint 11/14/2019   Adhesive capsulitis of left shoulder 10/02/2019   Shoulder pain, left 09/03/2019   Calcific tendinitis of left shoulder 09/03/2019   Fibroid uterus 07/04/2019   Uterine fibroid 07/04/2019   Tachycardia 05/14/2019   History of COVID-19 05/13/2019   Hair loss  05/13/2019   OSA (obstructive sleep apnea) 02/09/2016   Family history of systemic lupus erythematosus 02/09/2016   Vitamin D deficiency 09/02/2015   Morbid obesity due to excess calories (HCC) 09/02/2015   Iron deficiency anemia due to chronic blood loss 09/02/2015   Hypothyroidism due to Hashimoto's thyroiditis 09/02/2015    Allergies: No Known Allergies Medications:  Current Outpatient Medications:    ondansetron  (ZOFRAN-ODT) 4 MG disintegrating tablet, Take 1 tablet (4 mg total) by mouth every 8 (eight) hours as needed for up to 5 days for nausea or vomiting., Disp: 15 tablet, Rfl: 0   clindamycin (CLEOCIN T) 1 % lotion, Apply to affected area daily as directed, Disp: 60 mL, Rfl: 3   diazepam (VALIUM) 5 MG tablet, Take 1 tablet (5 mg total) by mouth every 12 (twelve) hours as needed for up to 2 doses for anxiety., Disp: 2 tablet, Rfl: 0   HYDROcodone-acetaminophen (NORCO) 10-325 MG tablet, Take 1 tablet by mouth every 8 (eight) hours as needed., Disp: 60 tablet, Rfl: 0   levothyroxine (SYNTHROID) 137 MCG tablet, Take 1 tablet (137 mcg total) by mouth daily., Disp: 90 tablet, Rfl: 0   meloxicam (MOBIC) 15 MG tablet, Take 1 tablet (15 mg total) by mouth daily as needed., Disp: 15 tablet, Rfl: 0   metFORMIN (GLUCOPHAGE) 500 MG tablet, Take 2 tablets (1,000 mg total) by mouth 2 (two) times daily with a meal., Disp: 120 tablet, Rfl: 1   omeprazole (PRILOSEC) 40 MG capsule, Take 1 capsule (40 mg total) by mouth daily as needed., Disp: 90 capsule, Rfl: 0   Vitamin D, Ergocalciferol, (DRISDOL) 1.25 MG (50000 UNIT) CAPS capsule, Take 1 capsule (50,000 Units total) by mouth every 7 (seven) days., Disp: 4 capsule, Rfl: 2  Observations/Objective: Patient is well-developed, well-nourished in no acute distress.  Resting comfortably  at home.  Head is normocephalic, atraumatic.  No labored breathing.  Speech is clear and coherent with logical content.  Patient is alert and oriented at baseline.    Assessment and Plan: 1. Gastroenteritis (Primary)  Increase fluids, no milk or dairy, UC or ER if sx persist or worsen.   Follow Up Instructions: I discussed the assessment and treatment plan with the patient. The patient was provided an opportunity to ask questions and all were answered. The patient agreed with the plan and demonstrated an understanding of the instructions.  A copy of instructions were sent to the  patient via MyChart unless otherwise noted below.     The patient was advised to call back or seek an in-person evaluation if the symptoms worsen or if the condition fails to improve as anticipated.    Georgana Curio, FNP

## 2023-03-27 ENCOUNTER — Other Ambulatory Visit: Payer: Self-pay

## 2023-03-28 ENCOUNTER — Emergency Department (HOSPITAL_COMMUNITY)
Admission: EM | Admit: 2023-03-28 | Discharge: 2023-03-28 | Disposition: A | Discharge status: Home / Self Care | Payer: No Typology Code available for payment source | Attending: Emergency Medicine | Admitting: Emergency Medicine

## 2023-03-28 ENCOUNTER — Emergency Department (HOSPITAL_COMMUNITY): Payer: No Typology Code available for payment source

## 2023-03-28 ENCOUNTER — Other Ambulatory Visit: Payer: Self-pay

## 2023-03-28 ENCOUNTER — Encounter (HOSPITAL_COMMUNITY): Payer: Self-pay

## 2023-03-28 ENCOUNTER — Other Ambulatory Visit (HOSPITAL_COMMUNITY): Payer: Self-pay

## 2023-03-28 DIAGNOSIS — Z7984 Long term (current) use of oral hypoglycemic drugs: Secondary | ICD-10-CM | POA: Insufficient documentation

## 2023-03-28 DIAGNOSIS — R7401 Elevation of levels of liver transaminase levels: Secondary | ICD-10-CM | POA: Insufficient documentation

## 2023-03-28 DIAGNOSIS — R109 Unspecified abdominal pain: Secondary | ICD-10-CM

## 2023-03-28 DIAGNOSIS — Z79899 Other long term (current) drug therapy: Secondary | ICD-10-CM | POA: Diagnosis not present

## 2023-03-28 DIAGNOSIS — E876 Hypokalemia: Secondary | ICD-10-CM | POA: Insufficient documentation

## 2023-03-28 DIAGNOSIS — R197 Diarrhea, unspecified: Secondary | ICD-10-CM | POA: Diagnosis not present

## 2023-03-28 DIAGNOSIS — Z20822 Contact with and (suspected) exposure to covid-19: Secondary | ICD-10-CM | POA: Diagnosis not present

## 2023-03-28 DIAGNOSIS — R1084 Generalized abdominal pain: Secondary | ICD-10-CM | POA: Diagnosis not present

## 2023-03-28 DIAGNOSIS — R1111 Vomiting without nausea: Secondary | ICD-10-CM | POA: Diagnosis not present

## 2023-03-28 DIAGNOSIS — R9431 Abnormal electrocardiogram [ECG] [EKG]: Secondary | ICD-10-CM | POA: Diagnosis not present

## 2023-03-28 DIAGNOSIS — R110 Nausea: Secondary | ICD-10-CM | POA: Diagnosis not present

## 2023-03-28 LAB — CBC WITH DIFFERENTIAL/PLATELET
Abs Immature Granulocytes: 0.06 10*3/uL (ref 0.00–0.07)
Basophils Absolute: 0 10*3/uL (ref 0.0–0.1)
Basophils Relative: 0 %
Eosinophils Absolute: 0.1 10*3/uL (ref 0.0–0.5)
Eosinophils Relative: 1 %
HCT: 41 % (ref 36.0–46.0)
Hemoglobin: 13.8 g/dL (ref 12.0–15.0)
Immature Granulocytes: 1 %
Lymphocytes Relative: 39 %
Lymphs Abs: 3.7 10*3/uL (ref 0.7–4.0)
MCH: 27.5 pg (ref 26.0–34.0)
MCHC: 33.7 g/dL (ref 30.0–36.0)
MCV: 81.8 fL (ref 80.0–100.0)
Monocytes Absolute: 0.7 10*3/uL (ref 0.1–1.0)
Monocytes Relative: 8 %
Neutro Abs: 4.8 10*3/uL (ref 1.7–7.7)
Neutrophils Relative %: 51 %
Platelets: 311 10*3/uL (ref 150–400)
RBC: 5.01 MIL/uL (ref 3.87–5.11)
RDW: 13.1 % (ref 11.5–15.5)
WBC: 9.4 10*3/uL (ref 4.0–10.5)
nRBC: 0 % (ref 0.0–0.2)

## 2023-03-28 LAB — HCG, SERUM, QUALITATIVE: Preg, Serum: NEGATIVE

## 2023-03-28 LAB — COMPREHENSIVE METABOLIC PANEL
ALT: 100 U/L — ABNORMAL HIGH (ref 0–44)
AST: 69 U/L — ABNORMAL HIGH (ref 15–41)
Albumin: 3.9 g/dL (ref 3.5–5.0)
Alkaline Phosphatase: 83 U/L (ref 38–126)
Anion gap: 11 (ref 5–15)
BUN: 8 mg/dL (ref 6–20)
CO2: 24 mmol/L (ref 22–32)
Calcium: 8.7 mg/dL — ABNORMAL LOW (ref 8.9–10.3)
Chloride: 101 mmol/L (ref 98–111)
Creatinine, Ser: 0.6 mg/dL (ref 0.44–1.00)
GFR, Estimated: >60 mL/min (ref >60)
Glucose, Bld: 108 mg/dL — ABNORMAL HIGH (ref 70–99)
Potassium: 3 mmol/L — ABNORMAL LOW (ref 3.5–5.1)
Sodium: 136 mmol/L (ref 135–145)
Total Bilirubin: 1.4 mg/dL — ABNORMAL HIGH (ref 0.0–1.2)
Total Protein: 8.3 g/dL — ABNORMAL HIGH (ref 6.5–8.1)

## 2023-03-28 LAB — URINALYSIS, ROUTINE W REFLEX MICROSCOPIC
Bilirubin Urine: NEGATIVE
Glucose, UA: NEGATIVE mg/dL
Hgb urine dipstick: NEGATIVE
Ketones, ur: 20 mg/dL — AB
Leukocytes,Ua: NEGATIVE
Nitrite: NEGATIVE
Protein, ur: NEGATIVE mg/dL
Specific Gravity, Urine: 1.012 (ref 1.005–1.030)
pH: 6 (ref 5.0–8.0)

## 2023-03-28 LAB — LIPASE, BLOOD: Lipase: 25 U/L (ref 11–51)

## 2023-03-28 LAB — RESP PANEL BY RT-PCR (RSV, FLU A&B, COVID)  RVPGX2
Influenza A by PCR: NEGATIVE
Influenza B by PCR: NEGATIVE
Resp Syncytial Virus by PCR: NEGATIVE
SARS Coronavirus 2 by RT PCR: NEGATIVE

## 2023-03-28 MED ORDER — POTASSIUM CHLORIDE 20 MEQ PO PACK
PACK | ORAL | 0 refills | Status: AC
  Filled 2023-03-28: qty 6, 2d supply, fill #0

## 2023-03-28 MED ORDER — IOHEXOL 300 MG/ML  SOLN
100.0000 mL | Freq: Once | INTRAMUSCULAR | Status: AC | PRN
  Administered 2023-03-28: 100 mL via INTRAVENOUS

## 2023-03-28 MED ORDER — POTASSIUM CHLORIDE 10 MEQ/100ML IV SOLN
10.0000 meq | INTRAVENOUS | Status: AC
  Administered 2023-03-28 (×2): 10 meq via INTRAVENOUS
  Filled 2023-03-28 (×2): qty 100

## 2023-03-28 MED ORDER — POTASSIUM CHLORIDE CRYS ER 20 MEQ PO TBCR
40.0000 meq | EXTENDED_RELEASE_TABLET | Freq: Once | ORAL | Status: AC
  Administered 2023-03-28: 40 meq via ORAL
  Filled 2023-03-28: qty 4

## 2023-03-28 NOTE — ED Triage Notes (Signed)
Sent by UC for N/V/D and generalized abdominal pain since last Thursday. UC sent pt for potassium of 3. Pt had IM zofran and 1L of fluid at UC.

## 2023-03-28 NOTE — ED Provider Triage Note (Signed)
 Emergency Medicine Provider Triage Evaluation Note  Heather Campos , a 48 y.o. female  was evaluated in triage.  Pt complains of abdominal pain.  Patient reports abdominal pain for the past several days with diarrhea and fatigue.  Went to urgent care, had low potassium and abnormal EKG.  Sent here for further evaluation  Review of Systems  Positive: Fatigue, abdominal pain, diarrhea Negative: fever  Physical Exam  BP (!) 154/98   Pulse 80   Temp 98.4 F (36.9 C) (Oral)   Resp 16   Ht 5' 4 (1.626 m)   Wt 118.3 kg   LMP 06/24/2019   SpO2 100%   BMI 44.77 kg/m  Gen:   Awake, no distress   Resp:  Normal effort  MSK:   Moves extremities without difficulty  Other:    Medical Decision Making  Medically screening exam initiated at 1:02 PM.  Appropriate orders placed.  Heather Campos was informed that the remainder of the evaluation will be completed by another provider, this initial triage assessment does not replace that evaluation, and the importance of remaining in the ED until their evaluation is complete.    Waddell Sluder, PA-C 03/28/23 [phone]

## 2023-03-28 NOTE — Discharge Instructions (Addendum)
Follow-up with your primary care doctor for a referral to a cardiologist for your palpitations.  Call your doctor to schedule a follow-up blood test to recheck your potassium.

## 2023-03-28 NOTE — ED Provider Notes (Signed)
 [facility] DEPARTMENT AT [facility] Provider Note   CSN: [id number] Arrival date & time: 03/28/23  1229     History  Chief Complaint  Patient presents with   Abdominal Pain    Heather Campos is a 48 y.o. female.  48 year old female presents with abdominal discomfort.  Diagnosed with gastroenteritis.  Seen urgent care today and was given IV fluids.  Was found to have low potassium.  He also did an EKG which she said was abnormal.  She denies any anginal type symptoms.  States that she has not had emesis currently.  Denies any severe diarrhea.  Abdominal pain characterizes crampy and midepigastric.  No urinary symptoms.       Home Medications Prior to Admission medications   Medication Sig Start Date End Date Taking? Authorizing Provider  clindamycin  (CLEOCIN  T) 1 % lotion Apply to affected area daily as directed 11/30/22     diazepam  (VALIUM ) 5 MG tablet Take 1 tablet (5 mg total) by mouth every 12 (twelve) hours as needed for up to 2 doses for anxiety. 02/13/23   Lorren Greig PARAS, NP  HYDROcodone -acetaminophen  (NORCO) 10-325 MG tablet Take 1 tablet by mouth every 8 (eight) hours as needed. 03/23/23     levothyroxine  (SYNTHROID ) 137 MCG tablet Take 1 tablet (137 mcg total) by mouth daily. 03/24/23 06/22/23  Lorren Greig PARAS, NP  meloxicam  (MOBIC ) 15 MG tablet Take 1 tablet (15 mg total) by mouth daily as needed. 09/12/22   Donah Penne LABOR, PA-C  metFORMIN  (GLUCOPHAGE ) 500 MG tablet Take 2 tablets (1,000 mg total) by mouth 2 (two) times daily with a meal. 02/20/23   Lorren Greig PARAS, NP  omeprazole  (PRILOSEC) 40 MG capsule Take 1 capsule (40 mg total) by mouth daily as needed. 11/17/21 02/09/23  Lorren Greig PARAS, NP  ondansetron  (ZOFRAN -ODT) 4 MG disintegrating tablet Take 1 tablet (4 mg total) by mouth every 8 (eight) hours as needed for up to 5 days for nausea or vomiting. 03/24/23 03/29/23  Blair, Diane W, FNP  potassium chloride  (KLOR-CON ) 20 MEQ packet Take one  packet (20 mEq) by mouth 3 (three) times a day for 2 days. Dissolve as directed. Return after 2 days for repeat CMP lab 03/28/23     Vitamin D , Ergocalciferol , (DRISDOL ) 1.25 MG (50000 UNIT) CAPS capsule Take 1 capsule (50,000 Units total) by mouth every 7 (seven) days. 05/25/22   Lorren Greig PARAS, NP  escitalopram  (LEXAPRO ) 10 MG tablet Take 1 tablet (10 mg total) by mouth daily. 12/13/22 12/19/22  Lorren Greig PARAS, NP      Allergies    Patient has no known allergies.    Review of Systems   Review of Systems  All other systems reviewed and are negative.   Physical Exam Updated Vital Signs BP (!) 154/95 (BP Location: Left Arm)   Pulse 82   Temp 98.5 F (36.9 C) (Oral)   Resp 19   Ht 1.626 m (5' 4)   Wt 118.3 kg   LMP 06/24/2019   SpO2 97%   BMI 44.77 kg/m  Physical Exam Vitals and nursing note reviewed.  Constitutional:      General: She is not in acute distress.    Appearance: Normal appearance. She is well-developed. She is not toxic-appearing.  HENT:     Head: Normocephalic and atraumatic.  Eyes:     General: Lids are normal.     Conjunctiva/sclera: Conjunctivae normal.     Pupils: Pupils are equal, round, and  reactive to light.  Neck:     Thyroid : No thyroid  mass.     Trachea: No tracheal deviation.  Cardiovascular:     Rate and Rhythm: Normal rate and regular rhythm.     Heart sounds: Normal heart sounds. No murmur heard.    No gallop.  Pulmonary:     Effort: Pulmonary effort is normal. No respiratory distress.     Breath sounds: Normal breath sounds. No stridor. No decreased breath sounds, wheezing, rhonchi or rales.  Abdominal:     General: There is no distension.     Palpations: Abdomen is soft.     Tenderness: There is no abdominal tenderness. There is no rebound.  Musculoskeletal:        General: No tenderness. Normal range of motion.     Cervical back: Normal range of motion and neck supple.  Skin:    General: Skin is warm and dry.     Findings: No  abrasion or rash.  Neurological:     Mental Status: She is alert and oriented to person, place, and time. Mental status is at baseline.     GCS: GCS eye subscore is 4. GCS verbal subscore is 5. GCS motor subscore is 6.     Cranial Nerves: No cranial nerve deficit.     Sensory: No sensory deficit.     Motor: Motor function is intact.  Psychiatric:        Attention and Perception: Attention normal.        Speech: Speech normal.        Behavior: Behavior normal.    ED Results / Procedures / Treatments   Labs (all labs ordered are listed, but only abnormal results are displayed) Labs Reviewed  COMPREHENSIVE METABOLIC PANEL - Abnormal; Notable for the following components:      Result Value   Potassium 3.0 (*)    Glucose, Bld 108 (*)    Calcium 8.7 (*)    Total Protein 8.3 (*)    AST 69 (*)    ALT 100 (*)    Total Bilirubin 1.4 (*)    All other components within normal limits  URINALYSIS, ROUTINE W REFLEX MICROSCOPIC - Abnormal; Notable for the following components:   Ketones, ur 20 (*)    All other components within normal limits  RESP PANEL BY RT-PCR (RSV, FLU A&B, COVID)  RVPGX2  LIPASE, BLOOD  CBC WITH DIFFERENTIAL/PLATELET  HCG, SERUM, QUALITATIVE    EKG EKG Interpretation Date/Time:  Tuesday March 28 2023 19:01:45 EST Ventricular Rate:  78 PR Interval:  174 QRS Duration:  96 QT Interval:  426 QTC Calculation: 486 R Axis:   13  Text Interpretation: Sinus rhythm Low voltage, precordial leads Borderline T abnormalities, diffuse leads Borderline prolonged QT interval Confirmed by Dasie Faden ([id number]) on 03/28/2023 9:06:38 PM  Radiology CT ABDOMEN PELVIS W CONTRAST Result Date: 03/28/2023 CLINICAL DATA:  Abdominal pain, acute nonlocalized EXAM: CT ABDOMEN AND PELVIS WITH CONTRAST TECHNIQUE: Multidetector CT imaging of the abdomen and pelvis was performed using the standard protocol following bolus administration of intravenous contrast. RADIATION DOSE REDUCTION: This  exam was performed according to the departmental dose-optimization program which includes automated exposure control, adjustment of the mA and/or kV according to patient size and/or use of iterative reconstruction technique. CONTRAST:  OMNIPAQUE  IOHEXOL  300 MG/ML  SOLN COMPARISON:  None Available. FINDINGS: Lower chest: Lung bases are clear. Hepatobiliary: No focal hepatic lesion. Postcholecystectomy. No biliary dilatation. Pancreas: Pancreas is normal. No ductal dilatation. No  pancreatic inflammation. Spleen: Normal spleen Adrenals/urinary tract: Adrenal glands and kidneys are normal. The ureters and bladder normal. Stomach/Bowel: Stomach, small bowel, appendix, and cecum are normal. The colon and rectosigmoid colon are normal. Vascular/Lymphatic: Abdominal aorta is normal caliber. No periportal or retroperitoneal adenopathy. No pelvic adenopathy. Reproductive: Uterus and adnexa unremarkable. Other: No free fluid. Musculoskeletal: No aggressive osseous lesion. IMPRESSION: 1. No acute findings in the abdomen pelvis. 2. Postcholecystectomy. Electronically Signed   By: Jackquline Boxer M.D.   On: 03/28/2023 15:55    Procedures Procedures    Medications Ordered in ED Medications  potassium chloride  10 mEq in 100 mL IVPB (has no administration in time range)  potassium chloride  SA (KLOR-CON  M) CR tablet 40 mEq (has no administration in time range)  iohexol  (OMNIPAQUE ) 300 MG/ML solution 100 mL (100 mLs Intravenous Contrast Given 03/28/23 1448)    ED Course/ Medical Decision Making/ A&P                                 Medical Decision Making Amount and/or Complexity of Data Reviewed ECG/medicine tests: ordered.  Risk Prescription drug management.  Patient is EKG per interpretation shows sinus rhythm.  No significant changes from prior.  Patient has no cardiac symptoms at this time.  States she had a normal EKG at the urgent care. Patient here with gastroenteritis symptoms.  Abdominal CT  showed no acute findings here.  Mild hypokalemia treated with oral and IV potassium.  Mild elevated transaminases noted.  Normal CBC.  Patient will take food at this time in the form of a [employer] sandwich.  Will discharge home        Final Clinical Impression(s) / ED Diagnoses Final diagnoses:  None    Rx / DC Orders ED Discharge Orders     None         Dasie Faden, MD 03/28/23 2118

## 2023-03-30 ENCOUNTER — Ambulatory Visit: Payer: Self-pay | Admitting: Family

## 2023-03-30 ENCOUNTER — Encounter: Payer: Self-pay | Admitting: Internal Medicine

## 2023-03-30 ENCOUNTER — Telehealth (HOSPITAL_BASED_OUTPATIENT_CLINIC_OR_DEPARTMENT_OTHER): Payer: No Typology Code available for payment source | Admitting: Internal Medicine

## 2023-03-30 DIAGNOSIS — K529 Noninfective gastroenteritis and colitis, unspecified: Secondary | ICD-10-CM

## 2023-03-30 DIAGNOSIS — R7989 Other specified abnormal findings of blood chemistry: Secondary | ICD-10-CM | POA: Diagnosis not present

## 2023-03-30 DIAGNOSIS — E876 Hypokalemia: Secondary | ICD-10-CM

## 2023-03-30 NOTE — Progress Notes (Signed)
 Patient ID: Heather Campos, female   DOB: Nov 21, 1975, 48 y.o.   MRN: [id number]  Virtual Visit via Video Note  I connected with Heather Campos on 03/30/2023 at 9:08 AM by a video enabled telemedicine application and verified that I am speaking with the correct person using two identifiers.  Location: Patient: home Provider: Office   I discussed the limitations of evaluation and management by telemedicine and the availability of in person appointments. The patient expressed understanding and agreed to proceed.  History of Present Illness: Patient with history of OSA, hypothyroidism, bilateral carpal tunnel syndrome, morbid obesity, IDA.  PCP is NP Amy Lorren.  This is an UC visit.  Patient presents today complaining of feeling weak and her stomach feeling woozy. She had a video visit on 03/24/2023 with symptoms of nausea, vomiting and diarrhea that started the day before.  She was diagnosed with gastroenteritis and was advised to increase fluid and prescribed Zofran  to use as needed for nausea/vomiting.  Subsequently seen at an urgent care on 03/28/2023 and was sent to the emergency room.  She reported abdominal cramps.  Found to have mild elevation in AST/ALT 69/100, total bili of 1.4.  Lipase and CBC normal.  Check for COVID, RSV and flu virus negative.  Did have low potassium of 3.  Patient states she was given fluid that contain potassium and also oral potassium.  CT of abdomen and pelvis revealed no acute findings.  Told to follow-up with her PCP.  Today she reports that she still feels a little weak.  Not eating as much.  Diarrhea is much better.  She has had 2 bowel movements in the past 24 hours that were a little loose; she states that the stools are becoming more firm.  Reports 10 pound weight loss since this all started.  Denies any fever, abdominal pain or recent use of antibiotics.  Stomach feels a little woozy.  She has been drinking water .  In regards to the elevated LFTs, patient  states she had seen the gastroenterologist Dr. Stacia in November of last year.  Diagnosed with fatty liver.  Screen for hepatitis C and B were negative.  She does not drink alcoholic beverages.   Observations/Objective: Middle-age African-American female in NAD  Assessment and Plan: 1. Gastroenteritis (Primary) -advised to continue to push fluids Purchase Imodium OTC and use PRN  2. Abnormal LFTs Gives hx of fatty liver, however LFTs prior to this acute illness were normal.  Recent hep C/hep screens negative.   - Hepatic Function Panel; Future  3. Hypokalemia Likely was due to diarrhea which has since slowed down. - Basic Metabolic Panel; Future   Follow Up Instructions: PRN with her PCP   I discussed the assessment and treatment plan with the patient. The patient was provided an opportunity to ask questions and all were answered. The patient agreed with the plan and demonstrated an understanding of the instructions.   The patient was advised to call back or seek an in-person evaluation if the symptoms worsen or if the condition fails to improve as anticipated.  I spent 18 minutes dedicated to the care of this patient on the date of this encounter to include previsit review of her records including recent visits, face-to-face time with patient discussing diagnosis and management and post visit entering of orders.  This note has been created with Education officer, environmental. Any transcriptional errors are unintentional.  Barnie Louder, MD

## 2023-03-30 NOTE — Telephone Encounter (Signed)
   Chief Complaint: had norovirus Symptoms: diarrhea, n/v Frequency: was in er last thrusday Pertinent Negatives: Patient denies diarrhea, n/v, fever, pain Disposition: [] ED /[] Urgent Care (no appt availability in office) / [x] Appointment(In office/virtual)/ []  [facility]/ [] Home Care/ [] Refused Recommended Disposition /[] [facility]/ []  Follow-up with PCP Additional Notes: Called for f/u apt due to having norovirus and being seen in the er.  Apt made for this am.  Denies questions. Reason for Disposition  [1] Mild diarrhea (e.g., 1-3 or more stools than normal in past 24 hours) without known cause AND [2] present >  7 days  Answer Assessment - Initial Assessment Questions 1. DIARRHEA SEVERITY: How bad is the diarrhea? How many more stools have you had in the past 24 hours than normal?    - NO DIARRHEA (SCALE 0)   - MILD (SCALE 1-3): Few loose or mushy BMs; increase of 1-3 stools over normal daily number of stools; mild increase in ostomy output.   -  MODERATE (SCALE 4-7): Increase of 4-6 stools daily over normal; moderate increase in ostomy output.   -  SEVERE (SCALE 8-10; OR WORST POSSIBLE): Increase of 7 or more stools daily over normal; moderate increase in ostomy output; incontinence.     States had a virus and was in the ER with norovirus 2. ONSET: When did the diarrhea begin?      Last week 3. BM CONSISTENCY: How loose or watery is the diarrhea?      Still loose 4. VOMITING: Are you also vomiting? If Yes, ask: How many times in the past 24 hours?      Denies, not anymore. 5. ABDOMEN PAIN: Are you having any abdomen pain? If Yes, ask: What does it feel like? (e.g., crampy, dull, intermittent, constant)      Stomach still feels funny but no pain 6. ABDOMEN PAIN SEVERITY: If present, ask: How bad is the pain?  (e.g., Scale 1-10; mild, moderate, or severe)   - MILD (1-3): doesn't interfere with normal activities, abdomen soft and not tender to  touch    - MODERATE (4-7): interferes with normal activities or awakens from sleep, abdomen tender to touch    - SEVERE (8-10): excruciating pain, doubled over, unable to do any normal activities       denies 7. ORAL INTAKE: If vomiting, Have you been able to drink liquids? How much liquids have you had in the past 24 hours?     No vomiting any more.  Patient is calling for f/u apt.  8. HYDRATION: Any signs of dehydration? (e.g., dry mouth [not just dry lips], too weak to stand, dizziness, new weight loss) When did you last urinate?     weak 9. EXPOSURE: Have you traveled to a foreign country recently? Have you been exposed to anyone with diarrhea? Could you have eaten any food that was spoiled?     Husband had norovirus.  10. ANTIBIOTIC USE: Are you taking antibiotics now or have you taken antibiotics in the past 2 months?       denies 11. OTHER SYMPTOMS: Do you have any other symptoms? (e.g., fever, blood in stool)       Denies.  Protocols used: [facility]

## 2023-03-31 ENCOUNTER — Other Ambulatory Visit: Payer: No Typology Code available for payment source

## 2023-03-31 DIAGNOSIS — E876 Hypokalemia: Secondary | ICD-10-CM

## 2023-03-31 DIAGNOSIS — R7989 Other specified abnormal findings of blood chemistry: Secondary | ICD-10-CM

## 2023-04-01 ENCOUNTER — Encounter: Payer: Self-pay | Admitting: Internal Medicine

## 2023-04-01 LAB — BASIC METABOLIC PANEL
BUN/Creatinine Ratio: 8 — ABNORMAL LOW (ref 9–23)
BUN: 6 mg/dL (ref 6–24)
CO2: 23 mmol/L (ref 20–29)
Calcium: 9.3 mg/dL (ref 8.7–10.2)
Chloride: 102 mmol/L (ref 96–106)
Creatinine, Ser: 0.74 mg/dL (ref 0.57–1.00)
Glucose: 95 mg/dL (ref 70–99)
Potassium: 4 mmol/L (ref 3.5–5.2)
Sodium: 141 mmol/L (ref 134–144)
eGFR: 101 mL/min/{1.73_m2} (ref >59)

## 2023-04-01 LAB — HEPATIC FUNCTION PANEL
ALT: 47 [IU]/L — ABNORMAL HIGH (ref 0–32)
AST: 24 [IU]/L (ref 0–40)
Albumin: 3.7 g/dL — ABNORMAL LOW (ref 3.9–4.9)
Alkaline Phosphatase: 95 [IU]/L (ref 44–121)
Bilirubin Total: 0.3 mg/dL (ref 0.0–1.2)
Bilirubin, Direct: 0.13 mg/dL (ref 0.00–0.40)
Total Protein: 6.6 g/dL (ref 6.0–8.5)

## 2023-04-04 ENCOUNTER — Ambulatory Visit: Payer: No Typology Code available for payment source | Admitting: Physician Assistant

## 2023-04-04 ENCOUNTER — Ambulatory Visit (INDEPENDENT_AMBULATORY_CARE_PROVIDER_SITE_OTHER): Payer: No Typology Code available for payment source | Admitting: Family

## 2023-04-04 VITALS — BP 128/86 | HR 83 | Temp 98.7°F | Ht 64.0 in | Wt 267.2 lb

## 2023-04-04 DIAGNOSIS — R109 Unspecified abdominal pain: Secondary | ICD-10-CM | POA: Diagnosis not present

## 2023-04-04 DIAGNOSIS — R9431 Abnormal electrocardiogram [ECG] [EKG]: Secondary | ICD-10-CM | POA: Diagnosis not present

## 2023-04-04 DIAGNOSIS — Z1321 Encounter for screening for nutritional disorder: Secondary | ICD-10-CM

## 2023-04-04 DIAGNOSIS — E876 Hypokalemia: Secondary | ICD-10-CM | POA: Diagnosis not present

## 2023-04-04 NOTE — Progress Notes (Signed)
Patient state no other concerns to discuss.

## 2023-04-04 NOTE — Progress Notes (Signed)
Patient ID: Heather Campos, female    DOB: 12/21/1975  MRN: [id number]  CC: Emergency Department Follow-Up  Subjective: Heather Campos is a 48 y.o. female who presents for Emergency Department follow-up.   Her concerns today include:  03/28/2023 [facility] Emergency Department at [facility] per MD note:  ED Course/ Medical Decision Making/ A&P                               Medical Decision Making Amount and/or Complexity of Data Reviewed ECG/medicine tests: ordered.   Risk Prescription drug management.   Patient is EKG per interpretation shows sinus rhythm.  No significant changes from prior.  Patient has no cardiac symptoms at this time.  States she had a normal EKG at the urgent care. Patient here with gastroenteritis symptoms.  Abdominal CT showed no acute findings here.  Mild hypokalemia treated with oral and IV potassium.  Mild elevated transaminases noted.  Normal CBC.  Patient will take food at this time in the form of a [employer] sandwich.  Will discharge home  Today's office visit 04/04/2023: Reports feeling improved since Emergency Department discharge. States she was told that she has an abnormal EKG and needs referral to Cardiology. Denies red flag symptoms. Requests Vitamin D and Vitamin B12 to be checked. No further issues/concerns for discussion today.  Patient Active Problem List   Diagnosis Date Noted   Prediabetes 05/25/2022   Anxiety and depression 12/24/2021   Cerumen debris on tympanic membrane of left ear 09/30/2021   Hypothyroidism 08/20/2021   Chronic pain 08/17/2021   Lumbar spondylosis 06/29/2021   Bilateral carpal tunnel syndrome 06/23/2021   Pain in joint of left knee 02/04/2021   COVID-19 11/27/2020   Class 3 severe obesity due to excess calories without serious comorbidity with body mass index (BMI) of 40.0 to 44.9 in adult [facility]) 11/27/2020   Pain in joint of right shoulder 10/06/2020   Calcific tendinitis of right shoulder  10/06/2020   Low back pain 08/13/2020   Insomnia 04/14/2020   Pain of toe of right foot 02/24/2020   Stiffness of left shoulder joint 11/14/2019   Adhesive capsulitis of left shoulder 10/02/2019   Shoulder pain, left 09/03/2019   Calcific tendinitis of left shoulder 09/03/2019   Fibroid uterus 07/04/2019   Uterine fibroid 07/04/2019   Tachycardia 05/14/2019   History of COVID-19 05/13/2019   Hair loss 05/13/2019   OSA (obstructive sleep apnea) 02/09/2016   Family history of systemic lupus erythematosus 02/09/2016   Vitamin D deficiency 09/02/2015   Morbid obesity due to excess calories (HCC) 09/02/2015   Iron deficiency anemia due to chronic blood loss 09/02/2015   Hypothyroidism due to Hashimoto's thyroiditis 09/02/2015     Current Outpatient Medications on File Prior to Visit  Medication Sig Dispense Refill   clindamycin (CLEOCIN T) 1 % lotion Apply to affected area daily as directed 60 mL 3   diazepam (VALIUM) 5 MG tablet Take 1 tablet (5 mg total) by mouth every 12 (twelve) hours as needed for up to 2 doses for anxiety. 2 tablet 0   HYDROcodone-acetaminophen (NORCO) 10-325 MG tablet Take 1 tablet by mouth every 8 (eight) hours as needed. 60 tablet 0   levothyroxine (SYNTHROID) 137 MCG tablet Take 1 tablet (137 mcg total) by mouth daily. 90 tablet 0   meloxicam (MOBIC) 15 MG tablet Take 1 tablet (15 mg total) by mouth daily as needed.  15 tablet 0   metFORMIN (GLUCOPHAGE) 500 MG tablet Take 2 tablets (1,000 mg total) by mouth 2 (two) times daily with a meal. 120 tablet 1   omeprazole (PRILOSEC) 40 MG capsule Take 1 capsule (40 mg total) by mouth daily as needed. 90 capsule 0   potassium chloride (KLOR-CON) 20 MEQ packet Take one packet (20 mEq) by mouth 3 (three) times a day for 2 days. Dissolve as directed. Return after 2 days for repeat CMP lab (Patient not taking: Reported on 04/04/2023) 6 packet 0   Vitamin D, Ergocalciferol, (DRISDOL) 1.25 MG (50000 UNIT) CAPS capsule Take 1  capsule (50,000 Units total) by mouth every 7 (seven) days. (Patient not taking: Reported on 04/04/2023) 4 capsule 2   [DISCONTINUED] escitalopram (LEXAPRO) 10 MG tablet Take 1 tablet (10 mg total) by mouth daily. 90 tablet 0   No current facility-administered medications on file prior to visit.    No Known Allergies  Social History   Socioeconomic History   Marital status: Married    Spouse name: Not on file   Number of children: Not on file   Years of education: Not on file   Highest education level: Some college, no degree  Occupational History   Not on file  Tobacco Use   Smoking status: Never    Passive exposure: Past   Smokeless tobacco: Never  Vaping Use   Vaping status: Never Used  Substance and Sexual Activity   Alcohol use: No   Drug use: No   Sexual activity: Yes    Birth control/protection: Surgical  Other Topics Concern   Not on file  Social History Narrative   Not on file   Social Drivers of Health   Financial Resource Strain: Low Risk  (02/06/2023)   Overall Financial Resource Strain (CARDIA)    Difficulty of Paying Living Expenses: Not hard at all  Food Insecurity: No Food Insecurity (02/06/2023)   Hunger Vital Sign    Worried About Running Out of Food in the Last Year: Never true    Ran Out of Food in the Last Year: Never true  Transportation Needs: No Transportation Needs (02/06/2023)   PRAPARE - Administrator, Civil Service (Medical): No    Lack of Transportation (Non-Medical): No  Physical Activity: Insufficiently Active (02/06/2023)   Exercise Vital Sign    Days of Exercise per Week: 1 day    Minutes of Exercise per Session: 10 min  Stress: Stress Concern Present (02/06/2023)   [employer] of Occupational Health - Occupational Stress Questionnaire    Feeling of Stress : To some extent  Social Connections: Socially Integrated (02/06/2023)   Social Connection and Isolation Panel [NHANES]    Frequency of Communication with  Friends and Family: More than three times a week    Frequency of Social Gatherings with Friends and Family: Three times a week    Attends Religious Services: More than 4 times per year    Active Member of Clubs or Organizations: Yes    Attends Engineer, structural: More than 4 times per year    Marital Status: Married  Catering manager Violence: Not on file    Family History  Problem Relation Age of Onset   Lupus Mother    Kidney disease Mother    Lupus Cousin     Past Surgical History:  Procedure Laterality Date   ABDOMINAL HYSTERECTOMY     CESAREAN SECTION     CHOLECYSTECTOMY     CYSTOSCOPY  07/04/2019   Procedure: CYSTOSCOPY;  Surgeon: Essie Hart, MD;  Location: [facility];  Service: Gynecology;;   HYSTERECTOMY ABDOMINAL WITH SALPINGECTOMY Bilateral 07/04/2019   Procedure: ABDOMINAL  suprcervical hysterctomyWITH SALPINGECTOMY ;  Surgeon: Essie Hart, MD;  Location[facility];  Service: Gynecology;  Laterality: Bilateral;   TOTAL LAPAROSCOPIC HYSTERECTOMY WITH SALPINGECTOMY Bilateral 07/04/2019   Procedure: attemted TOTAL LAPAROSCOPIC HYSTERECTOMY WITH SALPINGECTOMY;  Surgeon: Essie Hart, MD;  Location: [facility];  Service: Gynecology;  Laterality: Bilateral;  3 hours    ROS: Review of Systems Negative except as stated above  PHYSICAL EXAM: BP 128/86   Pulse 83   Temp 98.7 F (37.1 C) (Oral)   Ht 5\' 4"  (1.626 m)   Wt 267 lb 3.2 oz (121.2 kg)   LMP 06/24/2019   SpO2 97%   BMI 45.86 kg/m   Physical Exam HENT:     Head: Normocephalic and atraumatic.     Nose: Nose normal.     Mouth/Throat:     Mouth: Mucous membranes are moist.     Pharynx: Oropharynx is clear.  Eyes:     Extraocular Movements: Extraocular movements intact.     Conjunctiva/sclera: Conjunctivae normal.     Pupils: Pupils are equal, round, and reactive to light.  Cardiovascular:     Rate and Rhythm: Normal rate and regular rhythm.      Pulses: Normal pulses.     Heart sounds: Normal heart sounds.  Pulmonary:     Effort: Pulmonary effort is normal.     Breath sounds: Normal breath sounds.  Abdominal:     General: Bowel sounds are normal.     Palpations: Abdomen is soft.  Musculoskeletal:        General: Normal range of motion.     Cervical back: Normal range of motion and neck supple.  Neurological:     General: No focal deficit present.     Mental Status: She is alert and oriented to person, place, and time.  Psychiatric:        Mood and Affect: Mood normal.        Behavior: Behavior normal.     ASSESSMENT AND PLAN: 1. Abdominal pain, unspecified abdominal location (Primary) - Resolved.   2. Hypokalemia - Recent potassium normal on 03/31/2023.  3. Abnormal EKG - Referral to Cardiology for evaluation/management. - Ambulatory referral to Cardiology  4. Encounter for vitamin deficiency screening - Routine screening.  - Vitamin B12 - Vitamin D, 25-hydroxy   Patient was given the opportunity to ask questions.  Patient verbalized understanding of the plan and was able to repeat key elements of the plan. Patient was given clear instructions to go to Emergency Department or return to medical center if symptoms don't improve, worsen, or new problems develop.The patient verbalized understanding.   Orders Placed This Encounter  Procedures   Vitamin B12   Vitamin D, 25-hydroxy   Ambulatory referral to Cardiology   Follow-up with primary provider as scheduled.  Rema Fendt, NP

## 2023-04-05 ENCOUNTER — Other Ambulatory Visit (HOSPITAL_COMMUNITY): Payer: Self-pay

## 2023-04-05 ENCOUNTER — Encounter: Payer: Self-pay | Admitting: Family

## 2023-04-05 ENCOUNTER — Other Ambulatory Visit: Payer: Self-pay | Admitting: Family

## 2023-04-05 DIAGNOSIS — E559 Vitamin D deficiency, unspecified: Secondary | ICD-10-CM

## 2023-04-05 LAB — VITAMIN D 25 HYDROXY (VIT D DEFICIENCY, FRACTURES): Vit D, 25-Hydroxy: 20.9 ng/mL — ABNORMAL LOW (ref 30.0–100.0)

## 2023-04-05 LAB — VITAMIN B12: Vitamin B-12: 465 pg/mL (ref 232–1245)

## 2023-04-05 MED ORDER — VITAMIN D (ERGOCALCIFEROL) 1.25 MG (50000 UNIT) PO CAPS
50000.0000 [IU] | ORAL_CAPSULE | ORAL | 0 refills | Status: AC
  Filled 2023-04-05: qty 4, 28d supply, fill #0
  Filled 2023-04-18 – 2023-05-03 (×2): qty 4, 28d supply, fill #1
  Filled 2023-05-31: qty 4, 28d supply, fill #2

## 2023-04-06 ENCOUNTER — Other Ambulatory Visit (HOSPITAL_COMMUNITY): Payer: Self-pay

## 2023-04-11 ENCOUNTER — Other Ambulatory Visit (INDEPENDENT_AMBULATORY_CARE_PROVIDER_SITE_OTHER): Payer: No Typology Code available for payment source

## 2023-04-11 ENCOUNTER — Ambulatory Visit (INDEPENDENT_AMBULATORY_CARE_PROVIDER_SITE_OTHER): Payer: No Typology Code available for payment source | Admitting: Gastroenterology

## 2023-04-11 ENCOUNTER — Encounter: Payer: Self-pay | Admitting: Gastroenterology

## 2023-04-11 VITALS — BP 120/64 | HR 79 | Ht 64.0 in | Wt 260.0 lb

## 2023-04-11 DIAGNOSIS — R7989 Other specified abnormal findings of blood chemistry: Secondary | ICD-10-CM

## 2023-04-11 DIAGNOSIS — R748 Abnormal levels of other serum enzymes: Secondary | ICD-10-CM

## 2023-04-11 DIAGNOSIS — K760 Fatty (change of) liver, not elsewhere classified: Secondary | ICD-10-CM

## 2023-04-11 DIAGNOSIS — A084 Viral intestinal infection, unspecified: Secondary | ICD-10-CM

## 2023-04-11 LAB — HEPATIC FUNCTION PANEL
ALT: 16 U/L (ref 0–35)
AST: 15 U/L (ref 0–37)
Albumin: 4.2 g/dL (ref 3.5–5.2)
Alkaline Phosphatase: 82 U/L (ref 39–117)
Bilirubin, Direct: 0.1 mg/dL (ref 0.0–0.3)
Total Bilirubin: 0.9 mg/dL (ref 0.2–1.2)
Total Protein: 8 g/dL (ref 6.0–8.3)

## 2023-04-11 NOTE — Progress Notes (Unsigned)
HPI : Heather Campos is a 48 y.o. female with a history of anxiety and obesity who presents to our office for follow-up after recent ER visit for abdominal pain and diarrhea.  She was initially seen by me in November of last year for further evaluation of fatty liver.  A workup for causes of chronic liver disease was unremarkable.  However, she did have a very slightly elevated smooth muscle antibody titer.  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  An elastography was ordered, but the patient has not yet scheduled this.  She started having problems with abdominal pain and diarrhea in January.  She was seen in the emergency department January 8 and again on February 4.  She had a CT scan of the abdomen/pelvis on both visits, and both were unremarkable.  CBC, CMP and lipase were normal in January. In February, she was noted to have hypokalemia (3) and elevated liver enzymes (ALT 100, AST 69).  Previously, her liver enzymes have been persistently normal.  Her liver enzymes were checked 3 days later, and had significantly improved (ALT 47, AST 24).  Her potassium had also normalized on recheck. She was felt to most likely have a viral gastroenteritis.  Today, she reports that her GI symptoms have resolved.  She denies any abdominal pain.  Her bowel habits are back to baseline, with formed brown stools.  With regards to her fatty liver, she tells me that she has made some improvements in her diet.  She has increased consumption of fruits and vegetables.  She is increased her water intake, and now drinks black coffee.  Her weight has remained stable.   CT abdomen/pelvis with contrast March 01, 2023 IMPRESSION: No acute findings in the abdomen or pelvis.   CT abdomen/pelvis with contrast March 28, 2023 IMPRESSION: 1. No acute findings in the abdomen pelvis. 2. Postcholecystectomy  Past Medical History:  Diagnosis Date   Allergic rhinitis    Anxiety    Chronic  back pain    Family history of systemic lupus erythematosus    GERD (gastroesophageal reflux disease)    Hypothyroidism due to Hashimoto's thyroiditis    IDA (iron deficiency anemia)    Morbid obesity (HCC)    OSA (obstructive sleep apnea)    No CPAP   Vitamin D deficiency      Past Surgical History:  Procedure Laterality Date   ABDOMINAL HYSTERECTOMY     CESAREAN SECTION     CHOLECYSTECTOMY     CYSTOSCOPY  07/04/2019   Procedure: CYSTOSCOPY;  Surgeon: Essie Hart, MD;  Location: [facility];  Service: Gynecology;;   HYSTERECTOMY ABDOMINAL WITH SALPINGECTOMY Bilateral 07/04/2019   Procedure: ABDOMINAL  suprcervical hysterctomyWITH SALPINGECTOMY ;  Surgeon: Essie Hart, MD;  Location: [facility];  Service: Gynecology;  Laterality: Bilateral;   TOTAL LAPAROSCOPIC HYSTERECTOMY WITH SALPINGECTOMY Bilateral 07/04/2019   Procedure: attemted TOTAL LAPAROSCOPIC HYSTERECTOMY WITH SALPINGECTOMY;  Surgeon: Essie Hart, MD;  Location: [facility];  Service: Gynecology;  Laterality: Bilateral;  3 hours   Family History  Problem Relation Age of Onset   Lupus Mother    Kidney disease Mother    Lupus Cousin    Social History   Tobacco Use   Smoking status: Never    Passive exposure: Past   Smokeless tobacco: Never  Vaping Use   Vaping status: Never Used  Substance Use Topics   Alcohol use: No   Drug use:  No   Current Outpatient Medications  Medication Sig Dispense Refill   clindamycin (CLEOCIN T) 1 % lotion Apply to affected area daily as directed 60 mL 3   diazepam (VALIUM) 5 MG tablet Take 1 tablet (5 mg total) by mouth every 12 (twelve) hours as needed for up to 2 doses for anxiety. 2 tablet 0   HYDROcodone-acetaminophen (NORCO) 10-325 MG tablet Take 1 tablet by mouth every 8 (eight) hours as needed. 60 tablet 0   levothyroxine (SYNTHROID) 137 MCG tablet Take 1 tablet (137 mcg total) by mouth daily. 90 tablet 0   meloxicam (MOBIC) 15 MG  tablet Take 1 tablet (15 mg total) by mouth daily as needed. 15 tablet 0   metFORMIN (GLUCOPHAGE) 500 MG tablet Take 2 tablets (1,000 mg total) by mouth 2 (two) times daily with a meal. 120 tablet 1   Vitamin D, Ergocalciferol, (DRISDOL) 1.25 MG (50000 UNIT) CAPS capsule Take 1 capsule (50,000 Units total) by mouth every 7 (seven) days for 12 doses. 12 capsule 0   omeprazole (PRILOSEC) 40 MG capsule Take 1 capsule (40 mg total) by mouth daily as needed. 90 capsule 0   potassium chloride (KLOR-CON) 20 MEQ packet Take one packet (20 mEq) by mouth 3 (three) times a day for 2 days. Dissolve as directed. Return after 2 days for repeat CMP lab (Patient not taking: Reported on 04/11/2023) 6 packet 0   No current facility-administered medications for this visit.   No Known Allergies   Review of Systems: All systems reviewed and negative except where noted in HPI.    CT ABDOMEN PELVIS W CONTRAST Result Date: 03/28/2023 CLINICAL DATA:  Abdominal pain, acute nonlocalized EXAM: CT ABDOMEN AND PELVIS WITH CONTRAST TECHNIQUE: Multidetector CT imaging of the abdomen and pelvis was performed using the standard protocol following bolus administration of intravenous contrast. RADIATION DOSE REDUCTION: This exam was performed according to the departmental dose-optimization program which includes automated exposure control, adjustment of the mA and/or kV according to patient size and/or use of iterative reconstruction technique. CONTRAST:  OMNIPAQUE IOHEXOL 300 MG/ML  SOLN COMPARISON:  None Available. FINDINGS: Lower chest: Lung bases are clear. Hepatobiliary: No focal hepatic lesion. Postcholecystectomy. No biliary dilatation. Pancreas: Pancreas is normal. No ductal dilatation. No pancreatic inflammation. Spleen: Normal spleen Adrenals/urinary tract: Adrenal glands and kidneys are normal. The ureters and bladder normal. Stomach/Bowel: Stomach, small bowel, appendix, and cecum are normal. The colon and rectosigmoid  colon are normal. Vascular/Lymphatic: Abdominal aorta is normal caliber. No periportal or retroperitoneal adenopathy. No pelvic adenopathy. Reproductive: Uterus and adnexa unremarkable. Other: No free fluid. Musculoskeletal: No aggressive osseous lesion. IMPRESSION: 1. No acute findings in the abdomen pelvis. 2. Postcholecystectomy. Electronically Signed   By: Genevive Bi M.D.   On: 03/28/2023 15:55    Physical Exam: BP 120/64   Pulse 79   Ht 5\' 4"  (1.626 m)   Wt 260 lb (117.9 kg)   LMP 06/24/2019   BMI 44.63 kg/m  Constitutional: Pleasant,well-developed, obese African-American female in no acute distress. HEENT: Normocephalic and atraumatic. Conjunctivae are normal. No scleral icterus. Neck supple.  Cardiovascular: Normal rate, regular rhythm.  Pulmonary/chest: Effort normal and breath sounds normal. No wheezing, rales or rhonchi. Abdominal: Soft, nondistended, nontender. Bowel sounds active throughout. There are no masses palpable. No hepatomegaly. Extremities: no edema Lymphadenopathy: No cervical adenopathy noted. Neurological: Alert and oriented to person place and time. Skin: Skin is warm and dry. No rashes noted. Psychiatric: Normal mood and affect. Behavior is normal.  CBC    Component Value Date/Time   WBC 9.4 03/28/2023 1303   RBC 5.01 03/28/2023 1303   HGB 13.8 03/28/2023 1303   HGB 13.1 05/10/2022 0827   HGB 13.0 05/27/2019 0850   HCT 41.0 03/28/2023 1303   HCT 39.9 05/27/2019 0850   PLT 311 03/28/2023 1303   PLT 275 05/10/2022 0827   PLT 343 05/27/2019 0850   MCV 81.8 03/28/2023 1303   MCV 80 05/27/2019 0850   MCH 27.5 03/28/2023 1303   MCHC 33.7 03/28/2023 1303   RDW 13.1 03/28/2023 1303   RDW 18.2 (H) 05/27/2019 0850   LYMPHSABS 3.7 03/28/2023 1303   LYMPHSABS 2.8 05/27/2019 0850   MONOABS 0.7 03/28/2023 1303   EOSABS 0.1 03/28/2023 1303   EOSABS 0.2 05/27/2019 0850   BASOSABS 0.0 03/28/2023 1303   BASOSABS 0.0 05/27/2019 0850    CMP      Component Value Date/Time   NA 141 03/31/2023 1020   K 4.0 03/31/2023 1020   CL 102 03/31/2023 1020   CO2 23 03/31/2023 1020   GLUCOSE 95 03/31/2023 1020   GLUCOSE 108 (H) 03/28/2023 1303   BUN 6 03/31/2023 1020   CREATININE 0.74 03/31/2023 1020   CREATININE 0.74 05/10/2022 0827   CALCIUM 9.3 03/31/2023 1020   PROT 6.6 03/31/2023 1020   ALBUMIN 3.7 (L) 03/31/2023 1020   AST 24 03/31/2023 1020   AST 28 05/10/2022 0827   ALT 47 (H) 03/31/2023 1020   ALT 28 05/10/2022 0827   ALKPHOS 95 03/31/2023 1020   BILITOT 0.3 03/31/2023 1020   BILITOT 0.4 05/10/2022 0827   GFRNONAA >60 03/28/2023 1303   GFRNONAA >60 05/10/2022 0827   GFRAA 117 12/26/2019 1135       Latest Ref Rng & Units 03/28/2023    1:03 PM 03/01/2023    5:38 PM 03/01/2023    4:11 PM  CBC EXTENDED  WBC 4.0 - 10.5 K/uL 9.4  12.1  10.4   RBC 3.87 - 5.11 MIL/uL 5.01  4.96  4.71   Hemoglobin 12.0 - 15.0 g/dL 16.1  09.6  04.5   HCT 36.0 - 46.0 % 41.0  41.1  38.9   Platelets 150 - 400 K/uL 311  288  296   NEUT# 1.7 - 7.7 K/uL 4.8  5.6  4.8   Lymph# 0.7 - 4.0 K/uL 3.7  5.3  4.6       ASSESSMENT AND PLAN:  48 year old female with self-limited abdominal pain and diarrhea.  Symptoms resolved spontaneously.  Suspect infectious etiology.  She did have transient liver enzyme elevation.  This is most likely secondary to virus versus medications.  However, given her slightly positive anti-smooth muscle antibody, we will repeat liver enzymes and repeat smooth muscle antibody titer. With regards to her fatty liver, we will get her scheduled for the previously recommended elastography.  We again reinforced the importance of lifestyle changes to include healthy diet, and regular physical activity.  Although the patient has made some improvements in her diet, she still has much room for improvement in her physical activity and exercise habits.   Elevated liver enzymes - Repeat hepatic panel/ASMA  MASLD - Elastography - Lifestyle  changes  Abdominal pain/diarrhea - Resolved - Likely viral  Colon cancer screening - Negative Cologuard January 2025, repeat 3 years  Hayward Rylander E. Tomasa Rand, MD  Gastroenterology   Rema Fendt, NP

## 2023-04-11 NOTE — Patient Instructions (Addendum)
Your provider has requested that you go to the basement level for lab work before leaving today. Press "B" on the elevator. The lab is located at the first door on the left as you exit the elevator.   You have been scheduled for an abdominal ultrasound with elastography at [facility] (1st floor). Your appointment is scheduled for 04/17/2023 at 10:30AM. Please arrive 15 minutes prior to your scheduled appointment for registration purposes. Make certain not to have anything to eat or drink 6 hours prior to your procedure. Should you need to reschedule your appointment, you may contact radiology at [phone].  Liver Elastography Various chronic liver diseases such as hepatitis B, C, and fatty liver disease can lead to tissue damage and subsequent scar tissue formation. As the scar tissue accumulates, the liver loses some of its elasticity and becomes stiffer. Liver elastography involves the use of a surface ultrasound probe that delivers a low frequency pulse or shear wave to a small volume of liver tissue under the rib cage. The transmission of the sound wave is completely painless. How Is a Liver Elastography Performed? The liver is located in the right upper abdomen under the rib cage. Patients are asked to lie flat on an examination table. A technician places the FibroScan probe between the ribs on the right side of the lower chest wall. A series of 10 painless pulses are then applied to the liver. The results are recorded on the equipment and an overall liver stiffness score is generated. This score is then interpreted by a qualified physician to predict the likelihood of advanced fibrosis or cirrhosis.  Patients are asked to wear loose clothing and should not consume any liquids or solids for a minimum of 4 hours before the test to increase the likelihood of obtaining reliable test results. The scan will take 10 to 15 minutes to complete, but patients should plan on being available for 30  minutes to allow time for preparation  _______________________________________________________  If your blood pressure at your visit was 140/90 or greater, please contact your primary care physician to follow up on this.  _______________________________________________________  If you are age 78 or older, your body mass index should be between 23-30. Your Body mass index is 44.63 kg/m. If this is out of the aforementioned range listed, please consider follow up with your Primary Care Provider.  If you are age 64 or younger, your body mass index should be between 19-25. Your Body mass index is 44.63 kg/m. If this is out of the aformentioned range listed, please consider follow up with your Primary Care Provider.   ________________________________________________________  The  GI providers would like to encourage you to use [facility] to communicate with providers for non-urgent requests or questions.  Due to long hold times on the telephone, sending your provider a message by [facility] may be a faster and more efficient way to get a response.  Please allow 48 business hours for a response.  Please remember that this is for non-urgent requests.  _______________________________________________________  It was a pleasure to see you today!  Thank you for trusting me with your gastrointestinal care!

## 2023-04-13 ENCOUNTER — Encounter: Payer: Self-pay | Admitting: Gastroenterology

## 2023-04-13 ENCOUNTER — Other Ambulatory Visit (HOSPITAL_COMMUNITY): Payer: Self-pay

## 2023-04-13 ENCOUNTER — Telehealth: Payer: Self-pay | Admitting: Gastroenterology

## 2023-04-13 ENCOUNTER — Telehealth: Payer: Self-pay | Admitting: Family

## 2023-04-13 LAB — ANTI-SMOOTH MUSCLE ANTIBODY, IGG: Actin (Smooth Muscle) Antibody (IGG): 22 U — ABNORMAL HIGH (ref <20)

## 2023-04-13 NOTE — Telephone Encounter (Signed)
Copied from CRM [id number]. Topic: Referral - Request for Referral >> Apr 13, 2023 12:06 PM Higinio Roger wrote: Did the patient discuss referral with their provider in the last year? Yes (If No - schedule appointment) (If Yes - send message)  Appointment offered? Yes  Type of order/referral and detailed reason for visit: weight loss surgery consultation  Preference of office, provider, location: No  If referral order, have you been seen by this specialty before? No (If Yes, this issue or another issue? When? Where?  Can we respond through MyChart? Yes

## 2023-04-13 NOTE — Progress Notes (Signed)
Heather Campos, Your liver enzymes have returned back to normal.  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  It remains very slightly high.  As we discussed in the office, slightly positive smooth muscle antibody titers are of limited clinical significance.  I think it is extremely unlikely that you have autoimmune hepatitis.  Additionally, even if you did have autoimmune hepatitis, we would not consider putting you on medications given that your liver enzymes are normal.    I recommend we periodically monitor your liver enzymes, and if they do increase again, then we can consider repeating the antibody titers as well. For now, I would recommend we repeat liver enzymes in 6 months.

## 2023-04-13 NOTE — Telephone Encounter (Signed)
Patient called and stated that she was wanting to know what her next steps are regarding her lab results. Patient is requesting a call back. Please advise.

## 2023-04-13 NOTE — Telephone Encounter (Signed)
Pt calling about her lab results and what the next steps are following lab resutls, please advise.

## 2023-04-14 ENCOUNTER — Other Ambulatory Visit: Payer: Self-pay | Admitting: Family

## 2023-04-14 DIAGNOSIS — R635 Abnormal weight gain: Secondary | ICD-10-CM

## 2023-04-14 DIAGNOSIS — Z6841 Body Mass Index (BMI) 40.0 and over, adult: Secondary | ICD-10-CM

## 2023-04-14 DIAGNOSIS — Z7689 Persons encountering health services in other specified circumstances: Secondary | ICD-10-CM

## 2023-04-14 NOTE — Telephone Encounter (Signed)
Pt was notified of results by Dr. Milana Kidney via Earleen Reaper.

## 2023-04-14 NOTE — Telephone Encounter (Signed)
 Complete

## 2023-04-17 ENCOUNTER — Ambulatory Visit (HOSPITAL_COMMUNITY)
Admission: RE | Admit: 2023-04-17 | Discharge: 2023-04-17 | Disposition: A | Discharge status: Home / Self Care | Payer: No Typology Code available for payment source | Source: Ambulatory Visit | Attending: Gastroenterology | Admitting: Gastroenterology

## 2023-04-17 DIAGNOSIS — K760 Fatty (change of) liver, not elsewhere classified: Secondary | ICD-10-CM | POA: Insufficient documentation

## 2023-04-18 ENCOUNTER — Other Ambulatory Visit (HOSPITAL_COMMUNITY): Payer: Self-pay

## 2023-04-18 ENCOUNTER — Other Ambulatory Visit: Payer: Self-pay

## 2023-04-19 ENCOUNTER — Ambulatory Visit: Payer: No Typology Code available for payment source | Admitting: Family

## 2023-04-19 ENCOUNTER — Telehealth: Payer: Self-pay | Admitting: Gastroenterology

## 2023-04-19 NOTE — Telephone Encounter (Signed)
 Inbound call from patient, calling to follow up on [place] results. Patient was advised Dr. Tomasa Rand was off this week, and would be back on Monday. She verbalized understanding but still wanted a message to be sent so he could be aware.

## 2023-04-19 NOTE — Telephone Encounter (Signed)
 Pt calling for [place] results. Please advise. Pt knows Dr. Tomasa Rand is out of the office.

## 2023-04-20 ENCOUNTER — Other Ambulatory Visit (HOSPITAL_COMMUNITY): Payer: Self-pay

## 2023-04-21 ENCOUNTER — Telehealth: Payer: Self-pay | Admitting: Family

## 2023-04-23 ENCOUNTER — Encounter: Payer: Self-pay | Admitting: Gastroenterology

## 2023-04-23 NOTE — Progress Notes (Signed)
 Heather Campos,  Your elastography test showed that you have no significant scarring of the liver.  This is good news.  With the appropriate changes in diet and exercise and weight loss, your liver can completely heal.  Please focus on the lifestyle changes previously discussed.  I would recommend you consider a repeat elastography in 5 years.

## 2023-04-24 NOTE — Telephone Encounter (Signed)
 The pt has viewed the results posted to My Chart

## 2023-05-03 ENCOUNTER — Other Ambulatory Visit: Payer: Self-pay | Admitting: Hematology and Oncology

## 2023-05-03 ENCOUNTER — Other Ambulatory Visit (HOSPITAL_COMMUNITY): Payer: Self-pay

## 2023-05-03 ENCOUNTER — Inpatient Hospital Stay: Payer: No Typology Code available for payment source | Attending: Hematology and Oncology

## 2023-05-03 DIAGNOSIS — D472 Monoclonal gammopathy: Secondary | ICD-10-CM | POA: Insufficient documentation

## 2023-05-03 LAB — CBC WITH DIFFERENTIAL (CANCER CENTER ONLY)
Abs Immature Granulocytes: 0.01 10*3/uL (ref 0.00–0.07)
Basophils Absolute: 0 10*3/uL (ref 0.0–0.1)
Basophils Relative: 1 %
Eosinophils Absolute: 0.2 10*3/uL (ref 0.0–0.5)
Eosinophils Relative: 2 %
HCT: 36.1 % (ref 36.0–46.0)
Hemoglobin: 12.2 g/dL (ref 12.0–15.0)
Immature Granulocytes: 0 %
Lymphocytes Relative: 45 %
Lymphs Abs: 3.3 10*3/uL (ref 0.7–4.0)
MCH: 27.3 pg (ref 26.0–34.0)
MCHC: 33.8 g/dL (ref 30.0–36.0)
MCV: 80.8 fL (ref 80.0–100.0)
Monocytes Absolute: 0.6 10*3/uL (ref 0.1–1.0)
Monocytes Relative: 8 %
Neutro Abs: 3.1 10*3/uL (ref 1.7–7.7)
Neutrophils Relative %: 44 %
Platelet Count: 245 10*3/uL (ref 150–400)
RBC: 4.47 MIL/uL (ref 3.87–5.11)
RDW: 13.7 % (ref 11.5–15.5)
WBC Count: 7.2 10*3/uL (ref 4.0–10.5)
nRBC: 0 % (ref 0.0–0.2)

## 2023-05-03 LAB — CMP (CANCER CENTER ONLY)
ALT: 39 U/L (ref 0–44)
AST: 23 U/L (ref 15–41)
Albumin: 4.1 g/dL (ref 3.5–5.0)
Alkaline Phosphatase: 72 U/L (ref 38–126)
Anion gap: 5 (ref 5–15)
BUN: 9 mg/dL (ref 6–20)
CO2: 28 mmol/L (ref 22–32)
Calcium: 8.8 mg/dL — ABNORMAL LOW (ref 8.9–10.3)
Chloride: 105 mmol/L (ref 98–111)
Creatinine: 0.72 mg/dL (ref 0.44–1.00)
GFR, Estimated: >60 mL/min (ref >60)
Glucose, Bld: 106 mg/dL — ABNORMAL HIGH (ref 70–99)
Potassium: 3.9 mmol/L (ref 3.5–5.1)
Sodium: 138 mmol/L (ref 135–145)
Total Bilirubin: 0.8 mg/dL (ref 0.0–1.2)
Total Protein: 7.3 g/dL (ref 6.5–8.1)

## 2023-05-03 LAB — LACTATE DEHYDROGENASE: LDH: 148 U/L (ref 98–192)

## 2023-05-04 LAB — KAPPA/LAMBDA LIGHT CHAINS
Kappa free light chain: 25.2 mg/L — ABNORMAL HIGH (ref 3.3–19.4)
Kappa, lambda light chain ratio: 1.23 (ref 0.26–1.65)
Lambda free light chains: 20.5 mg/L (ref 5.7–26.3)

## 2023-05-05 LAB — MULTIPLE MYELOMA PANEL, SERUM
Albumin SerPl Elph-Mcnc: 3.5 g/dL (ref 2.9–4.4)
Albumin/Glob SerPl: 1.1 (ref 0.7–1.7)
Alpha 1: 0.2 g/dL (ref 0.0–0.4)
Alpha2 Glob SerPl Elph-Mcnc: 0.6 g/dL (ref 0.4–1.0)
B-Globulin SerPl Elph-Mcnc: 1.1 g/dL (ref 0.7–1.3)
Gamma Glob SerPl Elph-Mcnc: 1.6 g/dL (ref 0.4–1.8)
Globulin, Total: 3.5 g/dL (ref 2.2–3.9)
IgA: 166 mg/dL (ref 87–352)
IgG (Immunoglobin G), Serum: 1651 mg/dL — ABNORMAL HIGH (ref 586–1602)
IgM (Immunoglobulin M), Srm: 27 mg/dL (ref 26–217)
M Protein SerPl Elph-Mcnc: 0.6 g/dL — ABNORMAL HIGH
Total Protein ELP: 7 g/dL (ref 6.0–8.5)

## 2023-05-09 ENCOUNTER — Other Ambulatory Visit (HOSPITAL_COMMUNITY): Payer: Self-pay

## 2023-05-09 MED ORDER — HYDROCODONE-ACETAMINOPHEN 10-325 MG PO TABS
1.0000 | ORAL_TABLET | Freq: Three times a day (TID) | ORAL | 0 refills | Status: DC | PRN
  Filled 2023-05-09: qty 60, 20d supply, fill #0

## 2023-05-10 ENCOUNTER — Inpatient Hospital Stay (HOSPITAL_BASED_OUTPATIENT_CLINIC_OR_DEPARTMENT_OTHER): Payer: No Typology Code available for payment source | Admitting: Hematology and Oncology

## 2023-05-10 VITALS — BP 121/81 | HR 77 | Temp 97.7°F | Resp 14 | Wt 260.4 lb

## 2023-05-10 DIAGNOSIS — D472 Monoclonal gammopathy: Secondary | ICD-10-CM | POA: Diagnosis not present

## 2023-05-10 NOTE — Progress Notes (Signed)
 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  2) 01/13/2020: establish care with Dr. Leonides Schanz. Kappa 18.3, Lambda 15.8, Ratio 1.16. M protein 0.6 3) 04/15/2020: M protein 0.5, Lambda 18.7, Kappa 21.8, ratio 1.17 4) 10/16/2020: WBC 10.3, Hgb 12.8, MCV 82.5, Plt 285. M protein 0.7, kappa 25.7, Lambda 18.9, ratio 1.36  Interval History:  Heather Campos 48 y.o. female with medical history significant for IgG Lambda MGUS who presents for a follow up visit. The patient's last visit was on 05/10/2022. In the interim since the last visit she has had no major changes in her health.   On exam today Heather Campos is accompanied by a friend.  She reports she has been well overall since her last visit.  She did have an infection with norovirus a few months ago and did report to the emergency department for that.  She also reports that he was hospitalized in September for "anxiety".  She notes her energy levels are good and her appetite is strong.  She reports that she does have her chronic back pain but no recent worsening of bone or back pain.  She has had no urinary symptoms.  Overall she feels well and has no questions concerns or complaints today..  Otherwise her health has been steady.  She otherwise denies any fevers, chills, sweats, nausea, vomiting or diarrhea.  A full 10 point ROS is listed below.  MEDICAL HISTORY:  Past Medical History:  Diagnosis Date   Allergic rhinitis    Anxiety    Chronic back pain    Family history of systemic lupus erythematosus    GERD (gastroesophageal reflux disease)    Hypothyroidism due to Hashimoto's thyroiditis    IDA  (iron deficiency anemia)    Morbid obesity (HCC)    OSA (obstructive sleep apnea)    No CPAP   Vitamin D deficiency     SURGICAL HISTORY: Past Surgical History:  Procedure Laterality Date   ABDOMINAL HYSTERECTOMY     CESAREAN SECTION     CHOLECYSTECTOMY     CYSTOSCOPY  07/04/2019   Procedure: CYSTOSCOPY;  Surgeon: Essie Hart, MD;  Location: [facility];  Service: Gynecology;;   HYSTERECTOMY ABDOMINAL WITH SALPINGECTOMY Bilateral 07/04/2019   Procedure: ABDOMINAL  suprcervical hysterctomyWITH SALPINGECTOMY ;  Surgeon: Essie Hart, MD;  Location: [facility];  Service: Gynecology;  Laterality: Bilateral;   TOTAL LAPAROSCOPIC HYSTERECTOMY WITH SALPINGECTOMY Bilateral 07/04/2019   Procedure: attemted TOTAL LAPAROSCOPIC HYSTERECTOMY WITH SALPINGECTOMY;  Surgeon: Essie Hart, MD;  Location: [facility] [place];  Service: Gynecology;  Laterality: Bilateral;  3 hours    SOCIAL HISTORY: Social History   Socioeconomic History   Marital status: Married    Spouse name: Not on file   Number of children: Not on file   Years of education: Not on file   Highest education level: Some college, no degree  Occupational History   Not on file  Tobacco Use   Smoking status: Never    Passive exposure: Past   Smokeless tobacco: Never  Vaping Use   Vaping status: Never Used  Substance and Sexual Activity   Alcohol use: No   Drug use: No   Sexual activity: Yes  Birth control/protection: Surgical  Other Topics Concern   Not on file  Social History Narrative   Not on file   Social Drivers of Health   Financial Resource Strain: Low Risk  (02/06/2023)   Overall Financial Resource Strain (CARDIA)    Difficulty of Paying Living Expenses: Not hard at all  Food Insecurity: No Food Insecurity (02/06/2023)   Hunger Vital Sign    Worried About Running Out of Food in the Last Year: Never true    Ran Out of Food in the Last Year: Never true  Transportation Needs:  No Transportation Needs (02/06/2023)   PRAPARE - Administrator, Civil Service (Medical): No    Lack of Transportation (Non-Medical): No  Physical Activity: Insufficiently Active (02/06/2023)   Exercise Vital Sign    Days of Exercise per Week: 1 day    Minutes of Exercise per Session: 10 min  Stress: Stress Concern Present (02/06/2023)   [employer] of Occupational Health - Occupational Stress Questionnaire    Feeling of Stress : To some extent  Social Connections: Socially Integrated (02/06/2023)   Social Connection and Isolation Panel [NHANES]    Frequency of Communication with Friends and Family: More than three times a week    Frequency of Social Gatherings with Friends and Family: Three times a week    Attends Religious Services: More than 4 times per year    Active Member of Clubs or Organizations: Yes    Attends Engineer, structural: More than 4 times per year    Marital Status: Married  Catering manager Violence: Not on file    FAMILY HISTORY: Family History  Problem Relation Age of Onset   Lupus Mother    Kidney disease Mother    Lupus Cousin     ALLERGIES:  has no known allergies.  MEDICATIONS:  Current Outpatient Medications  Medication Sig Dispense Refill   clindamycin (CLEOCIN T) 1 % lotion Apply to affected area daily as directed 60 mL 3   diazepam (VALIUM) 5 MG tablet Take 1 tablet (5 mg total) by mouth every 12 (twelve) hours as needed for up to 2 doses for anxiety. 2 tablet 0   HYDROcodone-acetaminophen (NORCO) 10-325 MG tablet TAKE 1 TABLET BY MOUTH EVERY 8 HOURS AS NEEDED 60 tablet 0   levothyroxine (SYNTHROID) 137 MCG tablet Take 1 tablet (137 mcg total) by mouth daily. 90 tablet 0   meloxicam (MOBIC) 15 MG tablet Take 1 tablet (15 mg total) by mouth daily as needed. 15 tablet 0   metFORMIN (GLUCOPHAGE) 500 MG tablet Take 2 tablets (1,000 mg total) by mouth 2 (two) times daily with a meal. 120 tablet 1   omeprazole (PRILOSEC) 40  MG capsule Take 1 capsule (40 mg total) by mouth daily as needed. 90 capsule 0   potassium chloride (KLOR-CON) 20 MEQ packet Take one packet (20 mEq) by mouth 3 (three) times a day for 2 days. Dissolve as directed. Return after 2 days for repeat CMP lab (Patient not taking: Reported on 04/11/2023) 6 packet 0   Vitamin D, Ergocalciferol, (DRISDOL) 1.25 MG (50000 UNIT) CAPS capsule Take 1 capsule (50,000 Units total) by mouth every 7 (seven) days for 12 doses. 12 capsule 0   No current facility-administered medications for this visit.    REVIEW OF SYSTEMS:   Constitutional: ( - ) fevers, ( - )  chills , ( - ) night sweats Eyes: ( - ) blurriness of vision, ( - ) double vision, ( - )  watery eyes Ears, nose, mouth, throat, and face: ( - ) mucositis, ( - ) sore throat Respiratory: ( - ) cough, ( - ) dyspnea, ( - ) wheezes Cardiovascular: ( - ) palpitation, ( - ) chest discomfort, ( - ) lower extremity swelling Gastrointestinal:  ( - ) nausea, ( - ) heartburn, ( - ) change in bowel habits Skin: ( - ) abnormal skin rashes Lymphatics: ( - ) new lymphadenopathy, ( - ) easy bruising Neurological: ( - ) numbness, ( - ) tingling, ( - ) new weaknesses Behavioral/Psych: ( - ) mood change, ( - ) new changes  All other systems were reviewed with the patient and are negative.  PHYSICAL EXAMINATION:  Vitals:   05/10/23 0836  BP: 121/81  Pulse: 77  Resp: 14  Temp: 97.7 F (36.5 C)  SpO2: 100%       Filed Weights   05/10/23 0836  Weight: 260 lb 6.4 oz (118.1 kg)       GENERAL: well appearing middle aged [place] American female alert, no distress and comfortable SKIN: skin color, texture, turgor are normal, no rashes or significant lesions EYES: conjunctiva are pink and non-injected, sclera clear LUNGS: clear to auscultation and percussion with normal breathing effort HEART: regular rate & rhythm and no murmurs and no lower extremity edema Musculoskeletal: no cyanosis of digits and no  clubbing  PSYCH: alert & oriented x 3, fluent speech NEURO: no focal motor/sensory deficits  LABORATORY DATA:  I have reviewed the data as listed    Latest Ref Rng & Units 05/03/2023    8:05 AM 03/28/2023    1:03 PM 03/01/2023    5:38 PM  CBC  WBC 4.0 - 10.5 K/uL 7.2  9.4  12.1   Hemoglobin 12.0 - 15.0 g/dL 16.1  09.6  04.5   Hematocrit 36.0 - 46.0 % 36.1  41.0  41.1   Platelets 150 - 400 K/uL 245  311  288        Latest Ref Rng & Units 05/03/2023    8:05 AM 04/11/2023   11:08 AM 03/31/2023   10:20 AM  CMP  Glucose 70 - 99 mg/dL 409   95   BUN 6 - 20 mg/dL 9   6   Creatinine 8.11 - 1.00 mg/dL 9.14   7.82   Sodium 956 - 145 mmol/L 138   141   Potassium 3.5 - 5.1 mmol/L 3.9   4.0   Chloride 98 - 111 mmol/L 105   102   CO2 22 - 32 mmol/L 28   23   Calcium 8.9 - 10.3 mg/dL 8.8   9.3   Total Protein 6.5 - 8.1 g/dL 7.3  8.0  6.6   Total Bilirubin 0.0 - 1.2 mg/dL 0.8  0.9  0.3   Alkaline Phos 38 - 126 U/L 72  82  95   AST 15 - 41 U/L 23  15  24    ALT 0 - 44 U/L 39  16  47     Lab Results  Component Value Date   MPROTEIN 0.6 (H) 05/03/2023   MPROTEIN 0.5 (H) 05/10/2022   MPROTEIN 0.5 (H) 04/21/2021   Lab Results  Component Value Date   KPAFRELGTCHN 25.2 (H) 05/03/2023   KPAFRELGTCHN 30.6 (H) 05/10/2022   KPAFRELGTCHN 25.6 (H) 04/21/2021   LAMBDASER 20.5 05/03/2023   LAMBDASER 21.7 05/10/2022   LAMBDASER 19.2 04/21/2021   KAPLAMBRATIO 1.23 05/03/2023   KAPLAMBRATIO 1.41 05/10/2022   KAPLAMBRATIO 5.38 05/09/2022  RADIOGRAPHIC STUDIES: US ABDOMEN RUQ W/ELASTOGRAPHY Result Date: 04/17/2023 : PROCEDURE: US ABDOMEN LIMITED W/ ELASTOGRAPHY HISTORY: Patient is a 48 y/o  Female with MASLD.  Cholecystectomy. COMPARISON: CT AP 03/28/2023, 03/01/2023 TECHNIQUE: Two-dimensional grayscale and color Doppler ultrasound of the abdomen was performed. Elastography of the liver was also performed. FINDINGS: The liver demonstrates an increased echotexture without intrahepatic biliary  dilatation. No masses are visualized. The main portal vein demonstrates normal hepatopedal flow. The gallbladder is surgically absent. The common bile duct measures 0.5 cm. ULTRASOUND HEPATIC ELASTOGRAPHY Device: Siemens Helix VTQ Patient position: Oblique Transducer DAX Number of measurements: 12 Hepatic segment:  8 Median kPa: 2.8 IQR: 1.0 IQR/Median kPa ratio: 0.36 Data quality: IQR/Median kPa ratio of 0.3 or greater indicates reduced accuracy Diagnostic category:  < or = 5 kPa: high probability of being normal IMPRESSION: 1. Increased hepatic echotexture, most commonly seen with steatosis. Correlation with LFT's is recommended. 2.  Surgically absent gallbladder.  No biliary dilatation. ULTRASOUND HEPATIC ELASTOGRAPHY: Median kPa: 2.8 Diagnostic category:  < or = 5 kPa: high probability of being normal Diagnostic Categories: < or =5 kPa: high probability of being normal < or =9 kPa: in the absence of other known clinical signs, rules out cACLD >9 kPa and ?13 kPa: suggestive of cACLD, but needs further testing >13 kPa: highly suggestive of cACLD > or =17 kPa: highly suggestive of cACLD with an increased probability of clinically significant portal hypertension Thank you for allowing [place] to assist in the care of this patient. Electronically Signed   By: Lestine Box M.D.   On: 04/17/2023 22:04     ASSESSMENT & PLAN Heather Campos 48 y.o. female with medical history significant for IgG Lambda MGUS who presents for a follow up visit.   After review the labs, the records, discussion with the patient the findings most consistent with an IgG lambda monoclonal gammopathy of undetermined significance.  The patient does not have any of the high risk features that would prompt [place] to perform a bone marrow biopsy.  She has an M protein less than 1.5 which is of IgG specificity.  Her kappa and lambda light chains are also within normal limits with a normal ratio.  Given these findings I would recommend routine follow-up  in approximately 6 months time.  Recommend that we obtain periodic UPEP and metastatic surveys.  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  This appears to be stable. -- Other labs show white blood cell count 7.2, hemoglobin 12.2, MCV 80.8, platelets 245 --recommend a metastatic bone survey to be repeated at next visit. Patient had recent CT scan with no evidence of bone lesions.  --assure yearly UPEP (will order this today) -- No indication for a bone marrow biopsy at this time.  --RTC in 12 months or sooner if indicated by the above labs.   Orders Placed This Encounter  Procedures   24-Hr Ur UPEP/UIFE/Light Chains/TP    Standing Status:   Future    Expiration Date:   05/09/2024   All questions were answered. The patient knows to call the clinic with any problems, questions or concerns.  A total of more than 25 minutes were spent on this encounter and over half of that time was spent on counseling and coordination of care as outlined above.   Ulysees Barns, MD Department of Hematology/Oncology [facility]  at [facility] Phone: [phone] Pager: [phone] Email: [email or web address]  05/10/2023 9:02 AM

## 2023-05-16 ENCOUNTER — Other Ambulatory Visit: Payer: Self-pay

## 2023-05-16 ENCOUNTER — Other Ambulatory Visit (HOSPITAL_COMMUNITY): Payer: Self-pay

## 2023-05-16 ENCOUNTER — Other Ambulatory Visit: Payer: Self-pay | Admitting: Family

## 2023-05-16 DIAGNOSIS — Z131 Encounter for screening for diabetes mellitus: Secondary | ICD-10-CM

## 2023-05-16 MED ORDER — METFORMIN HCL 500 MG PO TABS
1000.0000 mg | ORAL_TABLET | Freq: Two times a day (BID) | ORAL | 2 refills | Status: AC
  Filled 2023-05-16 – 2023-08-18 (×2): qty 120, 30d supply, fill #0
  Filled 2023-09-20: qty 120, 30d supply, fill #1
  Filled 2024-01-23: qty 120, 30d supply, fill #2

## 2023-05-16 NOTE — Telephone Encounter (Signed)
 Complete

## 2023-05-26 ENCOUNTER — Other Ambulatory Visit (HOSPITAL_COMMUNITY): Payer: Self-pay

## 2023-05-31 NOTE — Progress Notes (Signed)
 HPI F former smoker followed for OSA,complicated by Allergic Rhinitis, GERD, Hypothyroid, Morbid Obesity, Anxiety/ Depression,  NPSG 09/17/18-[facility]/ [facility]- AHI 2.4/ hr, desaturation to 75%, body weight 252 lbs HST 04/07/20-AHI 22.8/ hr, desaturation to 77%, body weight   =======================================================================================================================  05/31/22- Virtual Visit via Video Note  I connected with Heather Campos on 05/31/22 at  9:30 AM EDT by a video enabled telemedicine application and verified that I am speaking with the correct person using two identifiers.  Location: Patient: home Provider: office   I discussed the limitations of evaluation and management by telemedicine and the availability of in person appointments. The patient expressed understanding and agreed to proceed.  History of Present Illness 83 yoF former smoker followed for OSA,complicated by Allergic Rhinitis, GERD, Hypothyroid, Morbid Obesity, Anxiety/ Depression,  CPAP auto 5-15/ [employer]- ordered 03/01/22 (replacing lost machine) Pt's grandchild had fever, positive for Covid so pt exposed> asked video visit today. Doing fine w CPAP, sleeping ok. No acute needs or concerns for us .  Observations/Objective: Download compliance- 93%, AHI 0.1/ hr  Assessment and Plan: OSA- continue CPAP 5-15 Headache- to f/u with Neurology Obesity- long term lifestyle issue  Follow Up Instructions:    I discussed the assessment and treatment plan with the patient. The patient was provided an opportunity to ask questions and all were answered. The patient agreed with the plan and demonstrated an understanding of the instructions.   The patient was advised to call back or seek an in-person evaluation if the symptoms worsen or if the condition fails to improve as anticipated.  I provided 20 minutes of non-face-to-face time during this encounter.   Rosa [employer],  MD  ----------------------------  06/01/22- 48 yoF former smoker followed for OSA,complicated by Allergic Rhinitis, GERD, Hypothyroid, Morbid Obesity, Anxiety/ Depression, MGUS,  CPAP auto 5-15/ [employer]- ordered 03/01/22 (replacing lost machine) Download compliance-77%, AHI 0.1/hr Body weight today-256 lbs CPAP has been cutting off in her sleep. Discussed the use of AI scribe software for clinical note transcription with the patient, who gave verbal consent to proceed.  History of Present Illness   The patient, with a history of sleep apnea, reports recent issues with her CPAP machine. She describes waking up in the middle of the night to find the machine has turned off, despite the mask remaining on her face. This has occurred twice, and each time she has restarted the machine, only to find it off again in the morning. The patient reports experiencing headaches when the machine turns off. The current machine was received in January of this year, after her previous machine started smelling moldy. The patient plans to contact the provider about the malfunctioning machine. Download reviewed showing excellent control of apneas.      Assessment and Plan:    Obstructive Sleep Apnea CPAP machine malfunctioning, causing headaches and interrupted sleep. Machine provides excellent apnea control when functioning properly. - Order servicing of the CPAP machine. - Advise her to report the malfunction to the supplier. - Ensure reliable CPAP machine function.    Obesity -encourage weight loss effort  ROS-see HPI   + = positive Constitutional:    weight loss, night sweats, fevers, chills, fatigue, lassitude. HEENT:    +headaches, difficulty swallowing, tooth/dental problems, sore throat,       sneezing, itching, ear ache, nasal congestion, post nasal drip, snoring CV:    chest pain, orthopnea, PND, swelling in lower extremities, anasarca,  dizziness, palpitations Resp:    shortness of breath with exertion or at rest.                productive cough,   non-productive cough, coughing up of blood.              change in color of mucus.  wheezing.   Skin:    rash or lesions. GI:  No-   heartburn, indigestion, abdominal pain, nausea, vomiting, diarrhea,                 change in bowel habits, loss of appetite GU: dysuria, change in color of urine, no urgency or frequency.   flank pain. MS:   joint pain, stiffness, decreased range of motion, back pain. Neuro-     nothing unusual Psych:  change in mood or affect.  depression or +anxiety.   memory loss.  OBJ- Physical Exam    + hijab General- Alert, Oriented, Affect-appropriate, Distress- none acute, +obese,  Skin- rash-none, lesions- none, excoriation- none Lymphadenopathy- none Head- atraumatic            Eyes- Gross vision intact, PERRLA, conjunctivae and secretions clear            Ears- Hearing, canals-normal            Nose- Clear, no-Septal dev, mucus, polyps, erosion, perforation             Throat- Mallampati IV , mucosa clear , drainage- none, tonsils- atrophic Neck- flexible , trachea midline, no stridor , thyroid  nl, carotid no bruit Chest - symmetrical excursion , unlabored           Heart/CV- RRR , no murmur , no gallop  , no rub, nl s1 s2                           - JVD- none , edema- none, stasis changes- none, varices- none           Lung- clear to P&A, wheeze- none, cough- none , dullness-none, rub- none           Chest wall-  Abd-  Br/ Gen/ Rectal- Not done, not indicated Extrem- cyanosis- none, clubbing, none, atrophy- none, strength- nl Neuro- grossly intact to observation

## 2023-06-01 ENCOUNTER — Encounter: Payer: Self-pay | Admitting: Internal Medicine

## 2023-06-01 ENCOUNTER — Ambulatory Visit: Payer: No Typology Code available for payment source | Admitting: Internal Medicine

## 2023-06-01 VITALS — BP 124/76 | HR 87 | Temp 98.1°F | Ht 64.0 in | Wt 256.8 lb

## 2023-06-01 DIAGNOSIS — G4733 Obstructive sleep apnea (adult) (pediatric): Secondary | ICD-10-CM

## 2023-06-01 DIAGNOSIS — Z87891 Personal history of nicotine dependence: Secondary | ICD-10-CM | POA: Diagnosis not present

## 2023-06-01 NOTE — Patient Instructions (Signed)
 Order- DME [employer]- please service or replace CPAP machine- patient reports it is cutting off during sleep.Continue auto 5-15, mask of choice, humidifier, supplies, [employer]/ card  Please call if we can help

## 2023-06-14 ENCOUNTER — Other Ambulatory Visit (HOSPITAL_COMMUNITY): Payer: Self-pay

## 2023-06-14 MED ORDER — HYDROCODONE-ACETAMINOPHEN 10-325 MG PO TABS
1.0000 | ORAL_TABLET | Freq: Three times a day (TID) | ORAL | 0 refills | Status: DC | PRN
  Filled 2023-06-14: qty 60, 20d supply, fill #0

## 2023-06-28 NOTE — Progress Notes (Unsigned)
  Cardiology Office Note:  .   Date:  06/28/2023  ID:  Heather Campos, DOB Dec 18, 1975, MRN [id number] PCP: Senaida Dama, NP  [facility] Providers Cardiologist:  Fransico Ivy, MD PCP: Senaida Dama, NP  No chief complaint on file.    Heather Campos is a 48 y.o. female with *** Discussed the use of AI scribe software for clinical note transcription with the patient, who gave verbal consent to proceed.  History of Present Illness       There were no vitals filed for this visit.    ROS      Studies Reviewed: .        *** Independently interpreted ***/202***: Chol ***, TG ***, HDL ***, LDL *** HbA1C ***% Hb *** Cr *** ***  Risk Assessment/Calculations:   {Does this patient have ATRIAL FIBRILLATION?:[id number]}    Physical Exam   VISIT DIAGNOSES: No diagnosis found.   Heather Campos is a 48 y.o. female with *** Assessment and Plan Assessment & Plan       {Are you ordering a CV Procedure (e.g. stress test, cath, DCCV, TEE, etc)?   Press F2        :[id number]}    No orders of the defined types were placed in this encounter.    F/u in ***  Signed, Cody Das, MD

## 2023-06-29 ENCOUNTER — Ambulatory Visit: Payer: No Typology Code available for payment source | Attending: Cardiology | Admitting: Cardiology

## 2023-06-29 ENCOUNTER — Encounter: Payer: Self-pay | Admitting: Cardiology

## 2023-06-29 VITALS — BP 136/84 | HR 80 | Resp 16 | Ht 64.0 in | Wt 261.1 lb

## 2023-06-29 DIAGNOSIS — E7800 Pure hypercholesterolemia, unspecified: Secondary | ICD-10-CM | POA: Diagnosis not present

## 2023-06-29 DIAGNOSIS — R9431 Abnormal electrocardiogram [ECG] [EKG]: Secondary | ICD-10-CM | POA: Diagnosis not present

## 2023-06-29 NOTE — Patient Instructions (Addendum)
 Lab Work: Lipid panel   If you have labs (blood work) drawn today and your tests are completely normal, you will receive your results only by: MyChart Message (if you have MyChart) OR A paper copy in the mail If you have any lab test that is abnormal or we need to change your treatment, we will call you to review the results.  Testing/Procedures: Calcium score   CT scanning for a cardiac calcium score (CAT scanning), is a noninvasive, special x-ray that produces cross-sectional images of the body using x-rays and a computer. CT scans help physicians diagnose and treat medical conditions. For some CT exams, a contrast material is used to enhance visibility in the area of the body being studied. CT scans provide greater clarity and reveal more details than regular x-ray exams.   Echo  Your physician has requested that you have an echocardiogram. Echocardiography is a painless test that uses sound waves to create images of your heart. It provides your doctor with information about the size and shape of your heart and how well your heart's chambers and valves are working. This procedure takes approximately one hour. There are no restrictions for this procedure. Please do NOT wear cologne, perfume, aftershave, or lotions (deodorant is allowed). Please arrive 15 minutes prior to your appointment time.  Please note: We ask at that you not bring children with you during ultrasound (echo/ vascular) testing. Due to room size and safety concerns, children are not allowed in the ultrasound rooms during exams. Our front office staff cannot provide observation of children in our lobby area while testing is being conducted. An adult accompanying a patient to their appointment will only be allowed in the ultrasound room at the discretion of the ultrasound technician under special circumstances. We apologize for any inconvenience.   Follow-Up: At [facility], you and your health needs are our  priority.  As part of our continuing mission to provide you with exceptional heart care, our providers are all part of one team.  This team includes your primary Cardiologist (physician) and Advanced Practice Providers or APPs (Physician Assistants and Nurse Practitioners) who all work together to provide you with the care you need, when you need it.  Your next appointment:   As needed   Provider:   Cody Das, MD    We recommend signing up for the patient portal called "MyChart".  Sign up information is provided on this After Visit Summary.  MyChart is used to connect with patients for Virtual Visits (Telemedicine).  Patients are able to view lab/test results, encounter notes, upcoming appointments, etc.  Non-urgent messages can be sent to your provider as well.   To learn more about what you can do with MyChart, go to [email or web address].

## 2023-06-30 LAB — LIPID PANEL
Chol/HDL Ratio: 3.7 ratio (ref 0.0–4.4)
Cholesterol, Total: 205 mg/dL — ABNORMAL HIGH (ref 100–199)
HDL: 55 mg/dL (ref >39)
LDL Chol Calc (NIH): 136 mg/dL — ABNORMAL HIGH (ref 0–99)
Triglycerides: 76 mg/dL (ref 0–149)
VLDL Cholesterol Cal: 14 mg/dL (ref 5–40)

## 2023-06-30 NOTE — Progress Notes (Signed)
 Cholesterol is elevated.  Recommend healthy diet and lifestyle as discussed in office visit and repeat lipid panel in 3 months.  This can be performed with PCP.  Patient has an upcoming appointment.  In addition, we could consider coronary calcium score scan for risk stratification.  Thanks MJP

## 2023-07-07 ENCOUNTER — Ambulatory Visit: Payer: Self-pay

## 2023-07-07 DIAGNOSIS — E7800 Pure hypercholesterolemia, unspecified: Secondary | ICD-10-CM

## 2023-07-20 ENCOUNTER — Other Ambulatory Visit (HOSPITAL_COMMUNITY): Payer: Self-pay

## 2023-07-20 MED ORDER — LIDOCAINE 5 % EX PTCH
MEDICATED_PATCH | CUTANEOUS | 0 refills | Status: DC
Start: 2023-07-20 — End: 2023-07-20
  Filled 2023-07-20: qty 30, 30d supply, fill #0

## 2023-07-21 ENCOUNTER — Encounter: Payer: Self-pay | Admitting: Internal Medicine

## 2023-07-25 ENCOUNTER — Other Ambulatory Visit (HOSPITAL_COMMUNITY): Payer: Self-pay

## 2023-07-25 ENCOUNTER — Other Ambulatory Visit: Payer: Self-pay | Admitting: Family

## 2023-07-25 DIAGNOSIS — E039 Hypothyroidism, unspecified: Secondary | ICD-10-CM

## 2023-07-26 ENCOUNTER — Other Ambulatory Visit (HOSPITAL_COMMUNITY): Payer: Self-pay

## 2023-07-26 MED ORDER — LEVOTHYROXINE SODIUM 137 MCG PO TABS
137.0000 ug | ORAL_TABLET | Freq: Every day | ORAL | 0 refills | Status: DC
Start: 2023-07-26 — End: 2023-10-27
  Filled 2023-07-26: qty 30, 30d supply, fill #0
  Filled 2023-08-18: qty 30, 30d supply, fill #1
  Filled 2023-09-20: qty 30, 30d supply, fill #2

## 2023-07-26 MED ORDER — HYDROCODONE-ACETAMINOPHEN 10-325 MG PO TABS
1.0000 | ORAL_TABLET | Freq: Three times a day (TID) | ORAL | 0 refills | Status: DC | PRN
  Filled 2023-07-26: qty 60, 20d supply, fill #0

## 2023-07-28 ENCOUNTER — Other Ambulatory Visit (HOSPITAL_BASED_OUTPATIENT_CLINIC_OR_DEPARTMENT_OTHER): Payer: Self-pay

## 2023-08-09 ENCOUNTER — Ambulatory Visit (HOSPITAL_COMMUNITY)

## 2023-08-16 ENCOUNTER — Other Ambulatory Visit (HOSPITAL_COMMUNITY)

## 2023-08-18 ENCOUNTER — Other Ambulatory Visit (HOSPITAL_COMMUNITY): Payer: Self-pay

## 2023-08-18 ENCOUNTER — Other Ambulatory Visit: Payer: Self-pay

## 2023-08-19 ENCOUNTER — Other Ambulatory Visit (HOSPITAL_COMMUNITY): Payer: Self-pay

## 2023-08-19 MED ORDER — HYDROCODONE-ACETAMINOPHEN 10-325 MG PO TABS
1.0000 | ORAL_TABLET | Freq: Three times a day (TID) | ORAL | 0 refills | Status: DC | PRN
  Filled 2023-08-19 – 2023-08-23 (×2): qty 60, 20d supply, fill #0

## 2023-08-21 ENCOUNTER — Other Ambulatory Visit (HOSPITAL_COMMUNITY): Payer: Self-pay

## 2023-08-21 MED ORDER — CLINDAMYCIN PHOSPHATE 1 % EX LOTN
1.0000 | TOPICAL_LOTION | Freq: Every day | CUTANEOUS | 3 refills | Status: AC
  Filled 2023-08-21: qty 60, 20d supply, fill #0
  Filled 2023-09-20: qty 60, 20d supply, fill #1
  Filled 2024-01-23: qty 60, 20d supply, fill #2

## 2023-08-22 ENCOUNTER — Other Ambulatory Visit (HOSPITAL_COMMUNITY): Payer: Self-pay

## 2023-08-23 ENCOUNTER — Other Ambulatory Visit (HOSPITAL_COMMUNITY): Payer: Self-pay

## 2023-08-29 ENCOUNTER — Other Ambulatory Visit (HOSPITAL_BASED_OUTPATIENT_CLINIC_OR_DEPARTMENT_OTHER)

## 2023-09-20 ENCOUNTER — Ambulatory Visit (HOSPITAL_COMMUNITY): Admission: RE | Admit: 2023-09-20 | Source: Ambulatory Visit | Attending: Cardiology | Admitting: Cardiology

## 2023-09-20 ENCOUNTER — Telehealth (HOSPITAL_COMMUNITY): Payer: Self-pay | Admitting: Cardiology

## 2023-09-20 NOTE — Telephone Encounter (Signed)
 Sorry to heart his. We can hold off echocardiogram at this time.  Thanks MJP

## 2023-09-20 NOTE — Telephone Encounter (Signed)
 Patient NO SHOWED echocardiogram for 09/20/23. She states that someone told her that she would have to pay 100.00 at the visit and she could not afford and didn't come. She did not cancel appt. She does not wish to reschedule and will call back when she can afford. Order will be removed from the echo WQ and when patient calls abck we will reinstate the order. Thank you.

## 2023-09-27 ENCOUNTER — Other Ambulatory Visit (HOSPITAL_COMMUNITY): Payer: Self-pay

## 2023-09-28 ENCOUNTER — Other Ambulatory Visit (HOSPITAL_COMMUNITY): Payer: Self-pay

## 2023-09-28 MED ORDER — HYDROCODONE-ACETAMINOPHEN 10-325 MG PO TABS
1.0000 | ORAL_TABLET | Freq: Three times a day (TID) | ORAL | 0 refills | Status: DC | PRN
  Filled 2023-09-28: qty 60, 20d supply, fill #0

## 2023-09-29 ENCOUNTER — Other Ambulatory Visit (HOSPITAL_COMMUNITY): Payer: Self-pay

## 2023-10-11 ENCOUNTER — Other Ambulatory Visit: Payer: Self-pay

## 2023-10-11 ENCOUNTER — Emergency Department (HOSPITAL_BASED_OUTPATIENT_CLINIC_OR_DEPARTMENT_OTHER)
Admission: EM | Admit: 2023-10-11 | Discharge: 2023-10-11 | Disposition: A | Discharge status: Home / Self Care | Attending: Emergency Medicine | Admitting: Emergency Medicine

## 2023-10-11 ENCOUNTER — Encounter (HOSPITAL_BASED_OUTPATIENT_CLINIC_OR_DEPARTMENT_OTHER): Payer: Self-pay | Admitting: Emergency Medicine

## 2023-10-11 ENCOUNTER — Emergency Department (HOSPITAL_BASED_OUTPATIENT_CLINIC_OR_DEPARTMENT_OTHER)

## 2023-10-11 DIAGNOSIS — R1031 Right lower quadrant pain: Secondary | ICD-10-CM | POA: Diagnosis not present

## 2023-10-11 DIAGNOSIS — E039 Hypothyroidism, unspecified: Secondary | ICD-10-CM | POA: Diagnosis not present

## 2023-10-11 DIAGNOSIS — N83202 Unspecified ovarian cyst, left side: Secondary | ICD-10-CM

## 2023-10-11 DIAGNOSIS — D72829 Elevated white blood cell count, unspecified: Secondary | ICD-10-CM | POA: Diagnosis not present

## 2023-10-11 LAB — COMPREHENSIVE METABOLIC PANEL WITH GFR
ALT: 16 U/L (ref 0–44)
AST: 20 U/L (ref 15–41)
Albumin: 4 g/dL (ref 3.5–5.0)
Alkaline Phosphatase: 86 U/L (ref 38–126)
Anion gap: 13 (ref 5–15)
BUN: 9 mg/dL (ref 6–20)
CO2: 20 mmol/L — ABNORMAL LOW (ref 22–32)
Calcium: 9.5 mg/dL (ref 8.9–10.3)
Chloride: 104 mmol/L (ref 98–111)
Creatinine, Ser: 0.92 mg/dL (ref 0.44–1.00)
GFR, Estimated: >60 mL/min (ref >60)
Glucose, Bld: 97 mg/dL (ref 70–99)
Potassium: 4.2 mmol/L (ref 3.5–5.1)
Sodium: 137 mmol/L (ref 135–145)
Total Bilirubin: 0.5 mg/dL (ref 0.0–1.2)
Total Protein: 7.5 g/dL (ref 6.5–8.1)

## 2023-10-11 LAB — CBC WITH DIFFERENTIAL/PLATELET
Abs Immature Granulocytes: 0.06 K/uL (ref 0.00–0.07)
Basophils Absolute: 0 K/uL (ref 0.0–0.1)
Basophils Relative: 0 %
Eosinophils Absolute: 0.2 K/uL (ref 0.0–0.5)
Eosinophils Relative: 2 %
HCT: 36.1 % (ref 36.0–46.0)
Hemoglobin: 12.1 g/dL (ref 12.0–15.0)
Immature Granulocytes: 1 %
Lymphocytes Relative: 46 %
Lymphs Abs: 5.2 K/uL — ABNORMAL HIGH (ref 0.7–4.0)
MCH: 27.6 pg (ref 26.0–34.0)
MCHC: 33.5 g/dL (ref 30.0–36.0)
MCV: 82.2 fL (ref 80.0–100.0)
Monocytes Absolute: 0.9 K/uL (ref 0.1–1.0)
Monocytes Relative: 8 %
Neutro Abs: 4.8 K/uL (ref 1.7–7.7)
Neutrophils Relative %: 43 %
Platelets: 272 K/uL (ref 150–400)
RBC: 4.39 MIL/uL (ref 3.87–5.11)
RDW: 13.4 % (ref 11.5–15.5)
WBC: 11.1 K/uL — ABNORMAL HIGH (ref 4.0–10.5)
nRBC: 0 % (ref 0.0–0.2)

## 2023-10-11 LAB — URINALYSIS, ROUTINE W REFLEX MICROSCOPIC
Bilirubin Urine: NEGATIVE
Glucose, UA: NEGATIVE mg/dL
Hgb urine dipstick: NEGATIVE
Ketones, ur: NEGATIVE mg/dL
Leukocytes,Ua: NEGATIVE
Nitrite: NEGATIVE
Protein, ur: NEGATIVE mg/dL
Specific Gravity, Urine: 1.013 (ref 1.005–1.030)
pH: 5.5 (ref 5.0–8.0)

## 2023-10-11 MED ORDER — IOHEXOL 300 MG/ML  SOLN
100.0000 mL | Freq: Once | INTRAMUSCULAR | Status: AC | PRN
  Administered 2023-10-11: 100 mL via INTRAVENOUS

## 2023-10-11 NOTE — ED Provider Notes (Signed)
 [place] [facility] AT [facility] Provider Note   CSN: [id number] Arrival date & time: 10/11/23  8251     Patient presents with: Groin Pain   Heather Campos is a 48 y.o. female.   Patient with 2-week history of right groin pain.  No fall or injury.  No urinary tract symptoms.  No lateral hip pain.  No back pain.  Is associated with some nausea.  No vomiting or diarrhea.  The groin pain kind of goes towards the suprapubic area as well.  Past medical history significant for hypothyroidism gastroesophageal reflux disease morbid obesity chronic back pain obstructive sleep apnea has had her gallbladder removed and has a hysterectomy.  It was an abdominal hysterectomy.       Prior to Admission medications   Medication Sig Start Date End Date Taking? Authorizing Provider  clindamycin  (CLEOCIN  T) 1 % lotion Apply topically to infected area as directed once daily. 08/21/23     diazepam  (VALIUM ) 5 MG tablet Take 1 tablet (5 mg total) by mouth every 12 (twelve) hours as needed for up to 2 doses for anxiety. 02/13/23   Lorren Greig PARAS, NP  HYDROcodone -acetaminophen  (NORCO) 10-325 MG tablet Take 1 tablet by mouth every 8 (eight) hours as needed. 09/28/23     levothyroxine  (SYNTHROID ) 137 MCG tablet Take 1 tablet (137 mcg total) by mouth daily. 07/26/23 10/27/23  McClung, Angela M, PA-C  metFORMIN  (GLUCOPHAGE ) 500 MG tablet Take 2 tablets (1,000 mg total) by mouth 2 (two) times daily with a meal. 05/16/23   Lorren Greig PARAS, NP  potassium chloride  (KLOR-CON ) 20 MEQ packet Take one packet (20 mEq) by mouth 3 (three) times a day for 2 days. Dissolve as directed. Return after 2 days for repeat CMP lab 03/28/23     escitalopram  (LEXAPRO ) 10 MG tablet Take 1 tablet (10 mg total) by mouth daily. 12/13/22 12/19/22  Lorren Greig PARAS, NP    Allergies: Patient has no known allergies.    Review of Systems  Constitutional:  Negative for chills and fever.  HENT:  Negative for ear pain and sore  throat.   Eyes:  Negative for pain and visual disturbance.  Respiratory:  Negative for cough and shortness of breath.   Cardiovascular:  Negative for chest pain and palpitations.  Gastrointestinal:  Positive for abdominal pain and nausea. Negative for diarrhea and vomiting.  Genitourinary:  Negative for dysuria and hematuria.  Musculoskeletal:  Negative for arthralgias and back pain.  Skin:  Negative for color change and rash.  Neurological:  Negative for seizures and syncope.  All other systems reviewed and are negative.   Updated Vital Signs BP 119/69   Pulse 85   Temp 98.7 F (37.1 C)   Resp 18   LMP 06/24/2019   SpO2 99%   Physical Exam Vitals and nursing note reviewed.  Constitutional:      General: She is not in acute distress.    Appearance: Normal appearance. She is well-developed.  HENT:     Head: Normocephalic and atraumatic.  Eyes:     Extraocular Movements: Extraocular movements intact.     Conjunctiva/sclera: Conjunctivae normal.     Pupils: Pupils are equal, round, and reactive to light.  Cardiovascular:     Rate and Rhythm: Normal rate and regular rhythm.     Heart sounds: No murmur heard. Pulmonary:     Effort: Pulmonary effort is normal. No respiratory distress.     Breath sounds: Normal breath sounds.  Abdominal:  Palpations: Abdomen is soft.     Tenderness: There is abdominal tenderness. There is no guarding.     Comments: Right groin area  Musculoskeletal:        General: No swelling.     Cervical back: Normal range of motion and neck supple.  Skin:    General: Skin is warm and dry.     Capillary Refill: Capillary refill takes less than 2 seconds.  Neurological:     General: No focal deficit present.     Mental Status: She is alert and oriented to person, place, and time.  Psychiatric:        Mood and Affect: Mood normal.     (all labs ordered are listed, but only abnormal results are displayed) Labs Reviewed  COMPREHENSIVE METABOLIC  PANEL WITH GFR - Abnormal; Notable for the following components:      Result Value   CO2 20 (*)    All other components within normal limits  CBC WITH DIFFERENTIAL/PLATELET - Abnormal; Notable for the following components:   WBC 11.1 (*)    Lymphs Abs 5.2 (*)    All other components within normal limits  URINALYSIS, ROUTINE W REFLEX MICROSCOPIC    EKG: None  Radiology: CT ABDOMEN PELVIS W CONTRAST Result Date: 10/11/2023 CLINICAL DATA:  Right lower quadrant and right groin pain. EXAM: CT ABDOMEN AND PELVIS WITH CONTRAST TECHNIQUE: Multidetector CT imaging of the abdomen and pelvis was performed using the standard protocol following bolus administration of intravenous contrast. RADIATION DOSE REDUCTION: This exam was performed according to the departmental dose-optimization program which includes automated exposure control, adjustment of the mA and/or kV according to patient size and/or use of iterative reconstruction technique. CONTRAST:  OMNIPAQUE  IOHEXOL  300 MG/ML  SOLN COMPARISON:  CTs with IV contrast 03/28/2023 and 03/01/2023. FINDINGS: Lower chest: No acute abnormality. Chronic elevation right hemidiaphragm. Hepatobiliary: No focal liver abnormality is seen. The liver is mildly steatotic and measures 21.5 cm in length. Status post cholecystectomy. No biliary dilatation. Pancreas: No abnormality. Spleen: No abnormality. Adrenals/Urinary Tract: No abnormality. Stomach/Bowel: No dilatation or wall thickening, including the appendix. Vascular/Lymphatic: No significant vascular findings are present. No enlarged abdominal or pelvic lymph nodes. There are chronic mildly prominent bilateral inguinal chain nodes largest is 1.2 cm in short axis, stable. Reproductive:   Status post hysterectomy.  Unremarkable right ovary. Left ovary is asymmetrically larger due to a new 4 cm thick-walled cystic lesion, Hounsfield density is 34 which is above the density of fluid. There are no surrounding  inflammatory/edematous changes, but would still recommend pelvic ultrasound to assess the internal architecture and need for follow-up. Benign and malignant etiologies are both possible. Other: Small umbilical fat hernia. No inguinal hernia. No incarcerated hernia. No free fluid, free hemorrhage or free air. Musculoskeletal: No acute or significant osseous findings. L4-5 again demonstrating a diffuse moderate disc bulge with subarticular zone effacement and moderate spinal canal stenosis, and a small calcified central disc extrusion at L5-S1. IMPRESSION: 1. No acute inflammatory process is seen in the abdomen or pelvis. 2. New 4 cm thick-walled cystic lesion in the left ovary. Pelvic ultrasound recommended to assess the internal architecture and need for follow-up. 3. Mildly prominent bilateral inguinal chain nodes, stable. 4. Mildly prominent liver with mild steatosis. 5. Small umbilical fat hernia. 6. L4-5 disc bulge with subarticular zone effacement and moderate spinal canal stenosis, and a small calcified central disc extrusion at L5-S1. Electronically Signed   By: Francis Quam M.D.   On:  10/11/2023 20:42     Procedures   Medications Ordered in the ED  iohexol  (OMNIPAQUE ) 300 MG/ML solution 100 mL (100 mLs Intravenous Contrast Given 10/11/23 2020)                                    Medical Decision Making Amount and/or Complexity of Data Reviewed Labs: ordered. Radiology: ordered.  Risk Prescription drug management.   Will: CT abdomen and pelvis and get labs.  Could just be a muscle strain.  But will rule out hernia also of the right lower quadrant make sure no appendicitis.  Complete metabolic panel is totally normal.  Renal function normal.  CBC normal except for white count 11.1.  Urinalysis negative.  Patient's had a hysterectomy so pregnancy is of no concern.  CT scan abdomen and pelvis: No evidence of an acute inflammatory process in the abdomen or pelvis.  There is a new 4 cm  thick walled cystic lesion left ovary.  The patient's pain is right groin.  Follow-up as needed for this.  We do not have ultrasound tonight so she will to follow-up with her doctor for that.  Small umbilical fat hernia.  L4-L5 disc bulge.  No findings on CT scan to explain the left groin pain.  So could be a muscle pull.   Final diagnoses:  Right groin pain    ED Discharge Orders     None          Geraldene Hamilton, MD 10/11/23 2059

## 2023-10-11 NOTE — Discharge Instructions (Addendum)
 Workup without any acute findings to explain the right groin pain.  There is a left ovarian cyst that will need follow-up by ultrasound need to follow-up with your primary care doctor.  This is on the left side so obviously is not causing the pain.  Suspect that this is a muscle pull regarding the right groin.  No evidence of any hernia or any other abnormalities in that area.  Labs were normal.  Would recommend over-the-counter extra strength Tylenol  and Motrin  up to 800 mg every 8 hours.  Extra strength Tylenol  will be 2 tablets every 8 hours.

## 2023-10-11 NOTE — ED Triage Notes (Signed)
 C/o R sided groin pain x 2 weeks. Denies urinary symptoms.

## 2023-10-12 ENCOUNTER — Other Ambulatory Visit (HOSPITAL_COMMUNITY): Payer: Self-pay

## 2023-10-12 DIAGNOSIS — L729 Follicular cyst of the skin and subcutaneous tissue, unspecified: Secondary | ICD-10-CM | POA: Diagnosis not present

## 2023-10-12 DIAGNOSIS — R1031 Right lower quadrant pain: Secondary | ICD-10-CM | POA: Diagnosis not present

## 2023-10-12 DIAGNOSIS — N83292 Other ovarian cyst, left side: Secondary | ICD-10-CM | POA: Diagnosis not present

## 2023-10-12 DIAGNOSIS — L723 Sebaceous cyst: Secondary | ICD-10-CM | POA: Diagnosis not present

## 2023-10-12 MED ORDER — CEPHALEXIN 500 MG PO CAPS
ORAL_CAPSULE | ORAL | 0 refills | Status: AC
  Filled 2023-10-12: qty 40, 10d supply, fill #0

## 2023-10-20 NOTE — Telephone Encounter (Signed)
===  View-only below this line=== ----- Message ----- From: Stacia Glendia BRAVO, MD Sent: 10/20/2023   7:51 AM EDT To: Naomie LOISE Sharps, RN  Would continue to monitor liver enzymes through PCP annually.  Thanks ----- Message ----- From: Sharps Naomie LOISE, RN Sent: 10/13/2023   4:20 PM EDT To: Glendia BRAVO Stacia, MD  You requested repeat LFT's on this patient (in 04/13/23 patient message) to be completed around 10/11/23. Another provider ordered CMP. Please review and advise of any recommendations you may have.

## 2023-10-27 ENCOUNTER — Other Ambulatory Visit: Payer: Self-pay | Admitting: Family

## 2023-10-27 DIAGNOSIS — E039 Hypothyroidism, unspecified: Secondary | ICD-10-CM

## 2023-10-27 NOTE — Telephone Encounter (Signed)
 Copied from CRM [id number]. Topic: Clinical - Medication Refill >> Oct 27, 2023  9:41 AM Delon HERO wrote: Medication: x #: [id number]  levothyroxine  (SYNTHROID ) 137 MCG tablet [[id number]]   Vitamin D , Ergocalciferol , (DRISDOL ) 1.25 MG (50000 UNIT) CAPS capsule [[id number]]  DISCONTINUED   omeprazole  (PRILOSEC) 40 MG capsule [[id number]]  DISCONTINUED   Has the patient contacted their pharmacy? Yes (Agent: If no, request that the patient contact the pharmacy for the refill. If patient does not wish to contact the pharmacy document the reason why and proceed with request.) (Agent: If yes, when and what did the pharmacy advise?)  This is the patient's preferred pharmacy:  [facility] [address] [place] [address] Phone: [phone] Fax: [phone]  Is this the correct pharmacy for this prescription? Yes If no, delete pharmacy and type the correct one.   Has the prescription been filled recently? Yes  Is the patient out of the medication? Yes  Has the patient been seen for an appointment in the last year OR does the patient have an upcoming appointment? Yes  Can we respond through MyChart? Yes  Agent: Please be advised that Rx refills may take up to 3 business days. We ask that you follow-up with your pharmacy.

## 2023-10-30 ENCOUNTER — Other Ambulatory Visit (HOSPITAL_COMMUNITY): Payer: Self-pay

## 2023-10-30 MED ORDER — LEVOTHYROXINE SODIUM 137 MCG PO TABS
137.0000 ug | ORAL_TABLET | Freq: Every day | ORAL | 0 refills | Status: DC
  Filled 2023-10-30: qty 30, 30d supply, fill #0

## 2023-10-30 NOTE — Telephone Encounter (Signed)
 Complete

## 2023-11-07 ENCOUNTER — Other Ambulatory Visit (HOSPITAL_COMMUNITY): Payer: Self-pay

## 2023-11-08 ENCOUNTER — Other Ambulatory Visit (HOSPITAL_COMMUNITY): Payer: Self-pay

## 2023-11-13 ENCOUNTER — Other Ambulatory Visit (HOSPITAL_COMMUNITY): Payer: Self-pay

## 2023-11-14 ENCOUNTER — Other Ambulatory Visit (HOSPITAL_COMMUNITY): Payer: Self-pay

## 2023-11-14 MED ORDER — HYDROCODONE-ACETAMINOPHEN 10-325 MG PO TABS
ORAL_TABLET | ORAL | 0 refills | Status: DC
  Filled 2023-11-14: qty 60, 20d supply, fill #0

## 2023-11-29 ENCOUNTER — Other Ambulatory Visit (HOSPITAL_COMMUNITY): Payer: Self-pay

## 2023-11-29 MED ORDER — VITAMIN D 1.25 MG (50000 UT) PO CAPS
50000.0000 [IU] | ORAL_CAPSULE | ORAL | 0 refills | Status: AC
  Filled 2023-11-29: qty 8, 56d supply, fill #0

## 2023-12-03 ENCOUNTER — Other Ambulatory Visit (HOSPITAL_COMMUNITY): Payer: Self-pay

## 2023-12-03 MED ORDER — NAPROXEN DR 500 MG PO TBEC
500.0000 mg | DELAYED_RELEASE_TABLET | Freq: Two times a day (BID) | ORAL | 0 refills | Status: AC
  Filled 2023-12-03: qty 28, 14d supply, fill #0

## 2023-12-03 MED ORDER — CYCLOBENZAPRINE HCL 10 MG PO TABS
10.0000 mg | ORAL_TABLET | Freq: Three times a day (TID) | ORAL | 0 refills | Status: DC | PRN
  Filled 2023-12-03: qty 30, 10d supply, fill #0

## 2023-12-06 ENCOUNTER — Encounter (INDEPENDENT_AMBULATORY_CARE_PROVIDER_SITE_OTHER): Payer: Self-pay

## 2023-12-06 ENCOUNTER — Encounter: Payer: Self-pay | Admitting: Plastic Surgery

## 2023-12-06 ENCOUNTER — Ambulatory Visit: Admitting: Plastic Surgery

## 2023-12-06 VITALS — BP 131/88 | HR 80 | Ht 64.0 in | Wt 264.4 lb

## 2023-12-06 DIAGNOSIS — Z6841 Body Mass Index (BMI) 40.0 and over, adult: Secondary | ICD-10-CM | POA: Diagnosis not present

## 2023-12-06 DIAGNOSIS — M542 Cervicalgia: Secondary | ICD-10-CM | POA: Diagnosis not present

## 2023-12-06 DIAGNOSIS — M546 Pain in thoracic spine: Secondary | ICD-10-CM | POA: Diagnosis not present

## 2023-12-06 DIAGNOSIS — N62 Hypertrophy of breast: Secondary | ICD-10-CM

## 2023-12-06 DIAGNOSIS — R9431 Abnormal electrocardiogram [ECG] [EKG]: Secondary | ICD-10-CM

## 2023-12-06 NOTE — Progress Notes (Signed)
 Referring Provider Lorren Greig PARAS, NP [address] [place],  [place] [address]   CC:  Chief Complaint  Patient presents with   Consult      Heather Campos is an 48 y.o. female.  HPI: Heather Campos is a 48 year old female who presents today with complaints of upper back and neck pain which she attributes to the large size of her breast.  She states this has been a problem for many years.  She she states that her husband has repeatedly pointed out that she is hunching over and she believes that this is due to the weight of her breast pulling on her chest wall.  Of note the patient currently has a BMI of 45.  She has started a workup with cardiology due to an abnormal EKG.  No Known Allergies  Outpatient Encounter Medications as of 12/06/2023  Medication Sig   Cholecalciferol (VITAMIN D ) 1.25 MG (50000 UT) CAPS Take 1 capsule by mouth once a week.   clindamycin  (CLEOCIN  T) 1 % lotion Apply topically to infected area as directed once daily.   cyclobenzaprine (FLEXERIL) 10 MG tablet Take 1 tablet (10 mg total) by mouth 3 (three) times daily as needed for muscle spasms.   diazepam  (VALIUM ) 5 MG tablet Take 1 tablet (5 mg total) by mouth every 12 (twelve) hours as needed for up to 2 doses for anxiety.   HYDROcodone -acetaminophen  (NORCO) 10-325 MG tablet Take 1 tablet by mouth every 8 (eight) hours as needed.   levothyroxine  (SYNTHROID ) 137 MCG tablet Take 1 tablet (137 mcg total) by mouth daily.   metFORMIN  (GLUCOPHAGE ) 500 MG tablet Take 2 tablets (1,000 mg total) by mouth 2 (two) times daily with a meal.   Naproxen  DR 500 MG TBEC Take 1 tablet (500 mg total) by mouth 2 (two) times daily in the morning and evening with meals for 14 days   cephALEXin  (KEFLEX ) 500 MG capsule Take 1 capsule every 6 hours by oral route as directed for 10 days, for skin cyst..   potassium chloride  (KLOR-CON ) 20 MEQ packet Take one packet (20 mEq) by mouth 3 (three) times a day for 2 days. Dissolve  as directed. Return after 2 days for repeat CMP lab   [DISCONTINUED] escitalopram  (LEXAPRO ) 10 MG tablet Take 1 tablet (10 mg total) by mouth daily.   No facility-administered encounter medications on file as of 12/06/2023.     Past Medical History:  Diagnosis Date   Allergic rhinitis    Anxiety    Chronic back pain    Family history of systemic lupus erythematosus    GERD (gastroesophageal reflux disease)    Hypothyroidism due to Hashimoto's thyroiditis    IDA (iron deficiency anemia)    Morbid obesity (HCC)    OSA (obstructive sleep apnea)    No CPAP   Vitamin D  deficiency     Past Surgical History:  Procedure Laterality Date   ABDOMINAL HYSTERECTOMY     CESAREAN SECTION     CHOLECYSTECTOMY     CYSTOSCOPY  07/04/2019   Procedure: CYSTOSCOPY;  Surgeon: Bettina Muskrat, MD;  Location: [facility];  Service: Gynecology;;   HYSTERECTOMY ABDOMINAL WITH SALPINGECTOMY Bilateral 07/04/2019   Procedure: ABDOMINAL  suprcervical hysterctomyWITH SALPINGECTOMY ;  Surgeon: Bettina Muskrat, MD;  Location: [facility] ;  Service: Gynecology;  Laterality: Bilateral;   TOTAL LAPAROSCOPIC HYSTERECTOMY WITH SALPINGECTOMY Bilateral 07/04/2019   Procedure: attemted TOTAL LAPAROSCOPIC HYSTERECTOMY WITH SALPINGECTOMY;  Surgeon: Bettina Muskrat, MD;  Location: [facility];  Service: Gynecology;  Laterality: Bilateral;  3 hours    Family History  Problem Relation Age of Onset   Lupus Mother    Kidney disease Mother    Lupus Cousin     Social History   Social History Narrative   Not on file     Review of Systems General: Denies fevers, chills, weight loss CV: Denies chest pain, shortness of breath, palpitations Breast: Large breasts which the patient feels are contributing to her upper back and neck pain and to her posture.  Physical Exam    12/06/2023    8:11 AM 10/11/2023    8:15 PM 10/11/2023    8:00 PM  Vitals with BMI  Height 5' 4    Weight 264 lbs  6 oz    BMI 45.36    Systolic 131 132 869  Diastolic 88 71 76  Pulse 80 80 78    General:  No acute distress,  Alert and oriented, Non-Toxic, Normal speech and affect Breast: Not examined on this visit. Mammogram: No mammogram available Assessment/Plan Symptomatic macromastia, obesity: Patient complains of upper back and neck pain which she feels are secondary to large breast size.  She is not examined today as she is not currently a candidate for breast reduction due to weight.  I discussed with her the increased risks of wound healing and DVT secondary to her weight.  I have told her that I did personally do not perform breast reductions on anybody with a BMI greater than 40.  I prefer that she be significantly less than 40.  She understands.  I will put in a consult for healthy weight wellness and have encouraged her to speak with her primary care provider.  Additionally she has started a workup for an abnormal EKG.  This needs to be completed.  She stated she will contact cardiology to complete her workup.  She will need cardiac clearance prior to any surgery.  Lastly I cannot find a mammogram.  She will need a mammogram prior to scheduling surgery.   Follow-up in January.  Heather Campos 12/06/2023, 8:36 AM

## 2023-12-12 ENCOUNTER — Telehealth: Payer: Self-pay | Admitting: *Deleted

## 2023-12-12 NOTE — Telephone Encounter (Signed)
-----   Message from Leonce KATHEE Birmingham sent at 12/06/2023  8:54 AM EDT ----- Regarding: Mammogram Angie,  Will you please let Ms Amoroso know I put in an order for mammogram.  I cannot find any mammogram results in her chart and she will need this prior to her breast reduction.  Thanks, Marinell

## 2023-12-13 ENCOUNTER — Encounter: Payer: Self-pay | Admitting: Plastic Surgery

## 2023-12-13 NOTE — Telephone Encounter (Addendum)
 Called and spoke with the patient on (12/12/23) regarding the message above.  Patient stated that she had her Mammogram done at her GYN's office [facility]) on (11/07/23).  The results were normal.      Patient stated that she will try to upload her mammogram results,so they will be seen in the chart.  Recent Mammogram results uploaded in the patient's chart.  A copy of the results were given to Dr. Waddell.//AR/CMA

## 2023-12-20 ENCOUNTER — Other Ambulatory Visit (HOSPITAL_COMMUNITY): Payer: Self-pay

## 2023-12-20 ENCOUNTER — Ambulatory Visit (INDEPENDENT_AMBULATORY_CARE_PROVIDER_SITE_OTHER): Payer: Self-pay | Admitting: Family

## 2023-12-20 ENCOUNTER — Encounter: Payer: Self-pay | Admitting: Family

## 2023-12-20 VITALS — BP 118/84 | HR 79 | Temp 98.7°F | Resp 16 | Ht 64.0 in | Wt 260.2 lb

## 2023-12-20 DIAGNOSIS — R7303 Prediabetes: Secondary | ICD-10-CM

## 2023-12-20 DIAGNOSIS — E039 Hypothyroidism, unspecified: Secondary | ICD-10-CM

## 2023-12-20 DIAGNOSIS — R635 Abnormal weight gain: Secondary | ICD-10-CM

## 2023-12-20 MED ORDER — LEVOTHYROXINE SODIUM 137 MCG PO TABS
137.0000 ug | ORAL_TABLET | Freq: Every day | ORAL | 0 refills | Status: DC
  Filled 2023-12-20: qty 30, 30d supply, fill #0
  Filled 2024-01-23: qty 30, 30d supply, fill #1
  Filled 2024-03-05 (×2): qty 30, 30d supply, fill #2

## 2023-12-20 NOTE — Progress Notes (Signed)
 Patient ID: Heather Campos, female    DOB: 11-Jul-1975  MRN: [id number]  CC: Chronic Conditions Follow-Up  Subjective: Heather Campos is a 47 y.o. female who presents for chronic conditions follow-up.  Her concerns today include:  - Doing well on Levothyroxine , no issues/concerns.  - Prediabetes follow-up. - States Plastic Surgery told her to lose 34 pounds before procedure. She is trying to watch what she eats. She is trying to exercise when able to do so. States she considered beginning a GLP-1 but does not want to due to her thyroid  history. Requests referral to weight specialist.  - Established with Cardiology.  Patient Active Problem List   Diagnosis Date Noted   Abnormal EKG 06/29/2023   Elevated LDL cholesterol level 06/29/2023   Prediabetes 05/25/2022   Anxiety and depression 12/24/2021   Cerumen debris on tympanic membrane of left ear 09/30/2021   Hypothyroidism 08/20/2021   Chronic pain 08/17/2021   Lumbar spondylosis 06/29/2021   Bilateral carpal tunnel syndrome 06/23/2021   Pain in joint of left knee 02/04/2021   COVID-19 11/27/2020   Class 3 severe obesity due to excess calories without serious comorbidity with body mass index (BMI) of 40.0 to 44.9 in adult (HCC) 11/27/2020   Pain in joint of right shoulder 10/06/2020   Calcific tendinitis of right shoulder 10/06/2020   Low back pain 08/13/2020   Insomnia 04/14/2020   Pain of toe of right foot 02/24/2020   Stiffness of left shoulder joint 11/14/2019   Adhesive capsulitis of left shoulder 10/02/2019   Shoulder pain, left 09/03/2019   Calcific tendinitis of left shoulder 09/03/2019   Fibroid uterus 07/04/2019   Uterine fibroid 07/04/2019   Tachycardia 05/14/2019   History of COVID-19 05/13/2019   Hair loss 05/13/2019   OSA (obstructive sleep apnea) 02/09/2016   Family history of systemic lupus erythematosus 02/09/2016   Vitamin D  deficiency 09/02/2015   Morbid obesity due to excess calories (HCC)  09/02/2015   Iron deficiency anemia due to chronic blood loss 09/02/2015   Hypothyroidism due to Hashimoto's thyroiditis 09/02/2015     Current Outpatient Medications on File Prior to Visit  Medication Sig Dispense Refill   clindamycin  (CLEOCIN  T) 1 % lotion Apply topically to infected area as directed once daily. 60 mL 3   cyclobenzaprine (FLEXERIL) 10 MG tablet Take 1 tablet (10 mg total) by mouth 3 (three) times daily as needed for muscle spasms. 30 tablet 0   diazepam  (VALIUM ) 5 MG tablet Take 1 tablet (5 mg total) by mouth every 12 (twelve) hours as needed for up to 2 doses for anxiety. 2 tablet 0   HYDROcodone -acetaminophen  (NORCO) 10-325 MG tablet Take 1 tablet by mouth every 8 (eight) hours as needed. 60 tablet 0   metFORMIN  (GLUCOPHAGE ) 500 MG tablet Take 2 tablets (1,000 mg total) by mouth 2 (two) times daily with a meal. 120 tablet 2   Naproxen  DR 500 MG TBEC Take 1 tablet (500 mg total) by mouth 2 (two) times daily in the morning and evening with meals for 14 days 28 tablet 0   cephALEXin  (KEFLEX ) 500 MG capsule Take 1 capsule every 6 hours by oral route as directed for 10 days, for skin cyst.. 40 capsule 0   Cholecalciferol (VITAMIN D ) 1.25 MG (50000 UT) CAPS Take 1 capsule by mouth once a week. 8 capsule 0   potassium chloride  (KLOR-CON ) 20 MEQ packet Take one packet (20 mEq) by mouth 3 (three) times a day for 2 days. Dissolve  as directed. Return after 2 days for repeat CMP lab 6 packet 0   [DISCONTINUED] escitalopram  (LEXAPRO ) 10 MG tablet Take 1 tablet (10 mg total) by mouth daily. 90 tablet 0   No current facility-administered medications on file prior to visit.    No Known Allergies  Social History   Socioeconomic History   Marital status: Married    Spouse name: Not on file   Number of children: Not on file   Years of education: Not on file   Highest education level: Some college, no degree  Occupational History   Not on file  Tobacco Use   Smoking status: Never     Passive exposure: Past   Smokeless tobacco: Never  Vaping Use   Vaping status: Never Used  Substance and Sexual Activity   Alcohol use: No   Drug use: No   Sexual activity: Yes    Birth control/protection: Surgical  Other Topics Concern   Not on file  Social History Narrative   Not on file   Social Drivers of Health   Financial Resource Strain: Low Risk  (05/16/2023)   Received from [facility]   Overall Financial Resource Strain (CARDIA)    Difficulty of Paying Living Expenses: Not hard at all  Food Insecurity: No Food Insecurity (05/16/2023)   [facility]   Hunger Vital Sign    Within the past 12 months, you worried that your food would run out before you got the money to buy more.: Never true    Within the past 12 months, the food you bought just didn't last and you didn't have money to get more.: Never true  Transportation Needs: No Transportation Needs (05/16/2023)   Received from [facility] - Transportation    Lack of Transportation (Medical): No    Lack of Transportation (Non-Medical): No  Physical Activity: Insufficiently Active (05/16/2023)   Received from [facility]   Exercise Vital Sign    On average, how many days per week do you engage in moderate to strenuous exercise (like a brisk walk)?: 2 days    On average, how many minutes do you engage in exercise at this level?: 30 min  Stress: No Stress Concern Present (05/16/2023)   Received from [facility] - Occupational Stress Questionnaire    Feeling of Stress : Only a little  Social Connections: Socially Integrated (05/16/2023)   Received from [facility]   Social Network    How would you rate your social network (family, work, friends)?: Good participation with social networks  Intimate Partner Violence: Not At Risk (05/16/2023)   Received from [facility]   HITS    Over the last 12 months how often did your partner physically  hurt you?: Never    Over the last 12 months how often did your partner insult you or talk down to you?: Never    Over the last 12 months how often did your partner threaten you with physical harm?: Never    Over the last 12 months how often did your partner scream or curse at you?: Never    Family History  Problem Relation Age of Onset   Lupus Mother    Kidney disease Mother    Lupus Cousin     Past Surgical History:  Procedure Laterality Date   ABDOMINAL HYSTERECTOMY     CESAREAN SECTION     CHOLECYSTECTOMY     CYSTOSCOPY  07/04/2019  Procedure: CYSTOSCOPY;  Surgeon: Bettina Muskrat, MD;  Location: [facility] [place];  Service: Gynecology;;   HYSTERECTOMY ABDOMINAL WITH SALPINGECTOMY Bilateral 07/04/2019   Procedure: ABDOMINAL  suprcervical hysterctomyWITH SALPINGECTOMY ;  Surgeon: Bettina Muskrat, MD;  Location: [facility] [place];  Service: Gynecology;  Laterality: Bilateral;   TOTAL LAPAROSCOPIC HYSTERECTOMY WITH SALPINGECTOMY Bilateral 07/04/2019   Procedure: attemted TOTAL LAPAROSCOPIC HYSTERECTOMY WITH SALPINGECTOMY;  Surgeon: Bettina Muskrat, MD;  Location: [facility] [place];  Service: Gynecology;  Laterality: Bilateral;  3 hours    ROS: Review of Systems Negative except as stated above  PHYSICAL EXAM: BP 118/84   Pulse 79   Temp 98.7 F (37.1 C) (Oral)   Resp 16   Ht 5' 4 (1.626 m)   Wt 260 lb 3.2 oz (118 kg)   LMP 06/24/2019   SpO2 94%   BMI 44.66 kg/m   Physical Exam HENT:     Head: Normocephalic and atraumatic.     Nose: Nose normal.     Mouth/Throat:     Mouth: Mucous membranes are moist.     Pharynx: Oropharynx is clear.  Eyes:     Extraocular Movements: Extraocular movements intact.     Conjunctiva/sclera: Conjunctivae normal.     Pupils: Pupils are equal, round, and reactive to light.  Neck:     Thyroid : No thyroid  mass, thyromegaly or thyroid  tenderness.  Cardiovascular:     Rate and Rhythm: Normal rate and regular rhythm.      Pulses: Normal pulses.     Heart sounds: Normal heart sounds.  Pulmonary:     Effort: Pulmonary effort is normal.     Breath sounds: Normal breath sounds.  Musculoskeletal:        General: Normal range of motion.     Cervical back: Normal range of motion and neck supple.  Neurological:     General: No focal deficit present.     Mental Status: She is alert and oriented to person, place, and time.  Psychiatric:        Mood and Affect: Mood normal.        Behavior: Behavior normal.     ASSESSMENT AND PLAN: 1. Hypothyroidism, unspecified type (Primary) - Continue Levothyroxine  as prescribed. Counseled on medication adherence/adverse effects.  - Routine screening.  - Follow-up with primary provider as scheduled.  - levothyroxine  (SYNTHROID ) 137 MCG tablet; Take 1 tablet (137 mcg total) by mouth daily.  Dispense: 90 tablet; Refill: 0 - TSH  2. Prediabetes - Routine screening.  - Hemoglobin A1c  3. Weight gain - Referral to Medical Weight Management for evaluation/management.  - Amb Ref to Medical Weight Management   Patient was given the opportunity to ask questions.  Patient verbalized understanding of the plan and was able to repeat key elements of the plan. Patient was given clear instructions to go to Emergency Department or return to medical center if symptoms don't improve, worsen, or new problems develop.The patient verbalized understanding.   Orders Placed This Encounter  Procedures   TSH   Hemoglobin A1c   Amb Ref to Medical Weight Management     Requested Prescriptions   Signed Prescriptions Disp Refills   levothyroxine  (SYNTHROID ) 137 MCG tablet 90 tablet 0    Sig: Take 1 tablet (137 mcg total) by mouth daily.    Return in about 3 months (around 03/21/2024) for Follow-Up or next available chronic conditions.  Greig JINNY Drones, NP

## 2023-12-21 ENCOUNTER — Ambulatory Visit: Payer: Self-pay | Admitting: Family

## 2023-12-21 LAB — HEMOGLOBIN A1C
Est. average glucose Bld gHb Est-mCnc: 120 mg/dL
Hgb A1c MFr Bld: 5.8 % — ABNORMAL HIGH (ref 4.8–5.6)

## 2023-12-21 LAB — TSH: TSH: 2.96 u[IU]/mL (ref 0.450–4.500)

## 2024-01-04 ENCOUNTER — Other Ambulatory Visit (HOSPITAL_COMMUNITY): Payer: Self-pay

## 2024-01-04 MED ORDER — HYDROCODONE-ACETAMINOPHEN 10-325 MG PO TABS
1.0000 | ORAL_TABLET | Freq: Three times a day (TID) | ORAL | 0 refills | Status: DC
  Filled 2024-01-04: qty 60, 20d supply, fill #0

## 2024-01-09 ENCOUNTER — Institutional Professional Consult (permissible substitution): Admitting: Bariatrics

## 2024-01-23 ENCOUNTER — Other Ambulatory Visit (HOSPITAL_COMMUNITY): Payer: Self-pay

## 2024-01-23 ENCOUNTER — Other Ambulatory Visit: Payer: Self-pay

## 2024-01-23 MED ORDER — HYDROCODONE-ACETAMINOPHEN 5-325 MG PO TABS
1.0000 | ORAL_TABLET | ORAL | 0 refills | Status: AC | PRN
  Filled 2024-01-23: qty 60, 10d supply, fill #0

## 2024-01-23 MED ORDER — CYCLOBENZAPRINE HCL 10 MG PO TABS
10.0000 mg | ORAL_TABLET | Freq: Three times a day (TID) | ORAL | 1 refills | Status: AC | PRN
  Filled 2024-01-23: qty 60, 20d supply, fill #0
  Filled 2024-03-05: qty 60, 20d supply, fill #1

## 2024-02-05 ENCOUNTER — Other Ambulatory Visit (HOSPITAL_COMMUNITY): Payer: Self-pay

## 2024-02-23 ENCOUNTER — Ambulatory Visit: Admitting: Student

## 2024-03-01 ENCOUNTER — Emergency Department (HOSPITAL_COMMUNITY)

## 2024-03-01 ENCOUNTER — Emergency Department (HOSPITAL_COMMUNITY)
Admission: EM | Admit: 2024-03-01 | Discharge: 2024-03-01 | Disposition: A | Discharge status: Home / Self Care | Attending: Emergency Medicine | Admitting: Emergency Medicine

## 2024-03-01 DIAGNOSIS — E039 Hypothyroidism, unspecified: Secondary | ICD-10-CM | POA: Insufficient documentation

## 2024-03-01 DIAGNOSIS — G43109 Migraine with aura, not intractable, without status migrainosus: Secondary | ICD-10-CM

## 2024-03-01 DIAGNOSIS — D72829 Elevated white blood cell count, unspecified: Secondary | ICD-10-CM | POA: Insufficient documentation

## 2024-03-01 DIAGNOSIS — R200 Anesthesia of skin: Secondary | ICD-10-CM | POA: Diagnosis present

## 2024-03-01 DIAGNOSIS — R7402 Elevation of levels of lactic acid dehydrogenase (LDH): Secondary | ICD-10-CM | POA: Diagnosis not present

## 2024-03-01 DIAGNOSIS — R531 Weakness: Secondary | ICD-10-CM

## 2024-03-01 DIAGNOSIS — G43809 Other migraine, not intractable, without status migrainosus: Secondary | ICD-10-CM | POA: Insufficient documentation

## 2024-03-01 DIAGNOSIS — Z79890 Hormone replacement therapy (postmenopausal): Secondary | ICD-10-CM | POA: Diagnosis not present

## 2024-03-01 LAB — PROTIME-INR
INR: 1 (ref 0.8–1.2)
Prothrombin Time: 14.1 s (ref 11.4–15.2)

## 2024-03-01 LAB — CBC
HCT: 40.3 % (ref 36.0–46.0)
Hemoglobin: 13.5 g/dL (ref 12.0–15.0)
MCH: 28.1 pg (ref 26.0–34.0)
MCHC: 33.5 g/dL (ref 30.0–36.0)
MCV: 83.8 fL (ref 80.0–100.0)
Platelets: 295 K/uL (ref 150–400)
RBC: 4.81 MIL/uL (ref 3.87–5.11)
RDW: 13.2 % (ref 11.5–15.5)
WBC: 13 K/uL — ABNORMAL HIGH (ref 4.0–10.5)
nRBC: 0 % (ref 0.0–0.2)

## 2024-03-01 LAB — COMPREHENSIVE METABOLIC PANEL WITH GFR
ALT: 22 U/L (ref 0–44)
AST: 23 U/L (ref 15–41)
Albumin: 4.4 g/dL (ref 3.5–5.0)
Alkaline Phosphatase: 101 U/L (ref 38–126)
Anion gap: 16 — ABNORMAL HIGH (ref 5–15)
BUN: 8 mg/dL (ref 6–20)
CO2: 23 mmol/L (ref 22–32)
Calcium: 10 mg/dL (ref 8.9–10.3)
Chloride: 99 mmol/L (ref 98–111)
Creatinine, Ser: 0.83 mg/dL (ref 0.44–1.00)
GFR, Estimated: >60 mL/min
Glucose, Bld: 122 mg/dL — ABNORMAL HIGH (ref 70–99)
Potassium: 3.8 mmol/L (ref 3.5–5.1)
Sodium: 138 mmol/L (ref 135–145)
Total Bilirubin: 0.6 mg/dL (ref 0.0–1.2)
Total Protein: 8.7 g/dL — ABNORMAL HIGH (ref 6.5–8.1)

## 2024-03-01 LAB — I-STAT CHEM 8, ED
BUN: 8 mg/dL (ref 6–20)
Calcium, Ion: 1.14 mmol/L — ABNORMAL LOW (ref 1.15–1.40)
Chloride: 100 mmol/L (ref 98–111)
Creatinine, Ser: 0.8 mg/dL (ref 0.44–1.00)
Glucose, Bld: 86 mg/dL (ref 70–99)
HCT: 38 % (ref 36.0–46.0)
Hemoglobin: 12.9 g/dL (ref 12.0–15.0)
Potassium: 3.6 mmol/L (ref 3.5–5.1)
Sodium: 138 mmol/L (ref 135–145)
TCO2: 25 mmol/L (ref 22–32)

## 2024-03-01 LAB — URINALYSIS, W/ REFLEX TO CULTURE (INFECTION SUSPECTED)
Bacteria, UA: NONE SEEN
Bilirubin Urine: NEGATIVE
Glucose, UA: NEGATIVE mg/dL
Hgb urine dipstick: NEGATIVE
Ketones, ur: NEGATIVE mg/dL
Leukocytes,Ua: NEGATIVE
Nitrite: NEGATIVE
Protein, ur: NEGATIVE mg/dL
Specific Gravity, Urine: 1.023 (ref 1.005–1.030)
pH: 6 (ref 5.0–8.0)

## 2024-03-01 LAB — URINE DRUG SCREEN
Amphetamines: NEGATIVE
Barbiturates: NEGATIVE
Benzodiazepines: NEGATIVE
Cocaine: NEGATIVE
Fentanyl: NEGATIVE
Methadone Scn, Ur: NEGATIVE
Opiates: NEGATIVE
Tetrahydrocannabinol: NEGATIVE

## 2024-03-01 LAB — DIFFERENTIAL
Abs Immature Granulocytes: 0.04 K/uL (ref 0.00–0.07)
Basophils Absolute: 0 K/uL (ref 0.0–0.1)
Basophils Relative: 0 %
Eosinophils Absolute: 0.3 K/uL (ref 0.0–0.5)
Eosinophils Relative: 2 %
Immature Granulocytes: 0 %
Lymphocytes Relative: 49 %
Lymphs Abs: 6.6 K/uL — ABNORMAL HIGH (ref 0.7–4.0)
Monocytes Absolute: 0.9 K/uL (ref 0.1–1.0)
Monocytes Relative: 7 %
Neutro Abs: 5.6 K/uL (ref 1.7–7.7)
Neutrophils Relative %: 42 %

## 2024-03-01 LAB — I-STAT CG4 LACTIC ACID, ED
Lactic Acid, Venous: 3.8 mmol/L (ref 0.5–1.9)
Lactic Acid, Venous: 2.6 mmol/L (ref 0.5–1.9)

## 2024-03-01 LAB — ETHANOL: Alcohol, Ethyl (B): <15 mg/dL

## 2024-03-01 LAB — APTT: aPTT: 32 s (ref 24–36)

## 2024-03-01 MED ORDER — SODIUM CHLORIDE 0.9 % IV BOLUS
500.0000 mL | Freq: Once | INTRAVENOUS | Status: AC
  Administered 2024-03-01: 500 mL via INTRAVENOUS

## 2024-03-01 MED ORDER — IOHEXOL 350 MG/ML SOLN
100.0000 mL | Freq: Once | INTRAVENOUS | Status: AC | PRN
  Administered 2024-03-01: 100 mL via INTRAVENOUS

## 2024-03-01 MED ORDER — PROCHLORPERAZINE EDISYLATE 10 MG/2ML IJ SOLN
10.0000 mg | Freq: Once | INTRAMUSCULAR | Status: AC
  Administered 2024-03-01: 10 mg via INTRAVENOUS
  Filled 2024-03-01: qty 2

## 2024-03-01 NOTE — ED Provider Notes (Signed)
 " Between EMERGENCY DEPARTMENT AT [facility] Provider Note   CSN: [id number] Arrival date & time: 03/01/24  1508     Patient presents with: No chief complaint on file.   Heather Campos is a 49 y.o. female.   Patient is a 49 year old female who has a history of prediabetes and hypothyroidism as well as chronic back pain.  She presents with left-sided numbness and weakness.  She said she started having some numbness and heaviness in her left arm 2 days ago.  She says it was pretty mild but today it got worse with numbness and heaviness in her left leg as well.  She also felt numbness across her jaw bilaterally and feels like her jaw is locking up.  She has an associated headache that started today.  She says its right-sided but now all over the head.  She denies any neck pain.  No chest pain.  She did have some shortness of breath after the symptoms had gotten worse today because she was scared.  No ongoing shortness of breath.  She did noticed an area of bruising in her left arm but does not think she hit it on anything or had any injury.  She denies any swelling of her arm or leg.  She has what she describes as a charley horse in the back of her left leg but no swelling of her legs.  No history of similar symptoms in the past.  She has a remote history of migraines but has not had one in several years.  She says did not feel similar to the symptoms.  She said she was having difficulty talking today but could not really describe what that entailed.  She also noted some double vision yesterday but no persistent double vision.       Prior to Admission medications  Medication Sig Start Date End Date Taking? Authorizing Provider  cephALEXin  (KEFLEX ) 500 MG capsule Take 1 capsule every 6 hours by oral route as directed for 10 days, for skin cyst.. 10/12/23     Cholecalciferol  (VITAMIN D ) 1.25 MG (50000 UT) CAPS Take 1 capsule by mouth once a week. 11/29/23     clindamycin  (CLEOCIN  T)  1 % lotion Apply topically to infected area as directed once daily. 08/21/23     cyclobenzaprine  (FLEXERIL ) 10 MG tablet Take 1 tablet (10 mg total) by mouth 3 (three) times daily as needed. 01/23/24     diazepam  (VALIUM ) 5 MG tablet Take 1 tablet (5 mg total) by mouth every 12 (twelve) hours as needed for up to 2 doses for anxiety. 02/13/23   Jaycee Greig PARAS, NP  HYDROcodone -acetaminophen  (NORCO) 10-325 MG tablet Take 1 tablet by mouth every 8 (eight) hours. 01/04/24     HYDROcodone -acetaminophen  (NORCO/VICODIN) 5-325 MG tablet TAKE 1 TABLET BY MOUTH EVERY 4 TO 6 HOURS AS NEEDED FOR PAIN FOR 5 DAYS 01/23/24     levothyroxine  (SYNTHROID ) 137 MCG tablet Take 1 tablet (137 mcg total) by mouth daily. 12/20/23 03/19/24  Jaycee Greig PARAS, NP  metFORMIN  (GLUCOPHAGE ) 500 MG tablet Take 2 tablets (1,000 mg total) by mouth 2 (two) times daily with a meal. 05/16/23   Jaycee Greig PARAS, NP  Naproxen  DR 500 MG TBEC Take 1 tablet (500 mg total) by mouth 2 (two) times daily in the morning and evening with meals for 14 days 12/03/23     potassium chloride  (KLOR-CON ) 20 MEQ packet Take one packet (20 mEq) by mouth 3 (three) times a  day for 2 days. Dissolve as directed. Return after 2 days for repeat CMP lab 03/28/23     escitalopram  (LEXAPRO ) 10 MG tablet Take 1 tablet (10 mg total) by mouth daily. 12/13/22 12/19/22  Jaycee Greig PARAS, NP    Allergies: Patient has no known allergies.    Review of Systems  Constitutional:  Negative for chills, diaphoresis, fatigue and fever.  HENT:  Negative for congestion, rhinorrhea and sneezing.   Eyes:  Positive for visual disturbance.  Respiratory:  Negative for cough, chest tightness and shortness of breath.   Cardiovascular:  Negative for chest pain and leg swelling.  Gastrointestinal:  Negative for abdominal pain, diarrhea, nausea and vomiting.  Genitourinary:  Negative for difficulty urinating, flank pain and frequency.  Musculoskeletal:  Positive for myalgias. Negative for arthralgias  and back pain.  Skin:  Negative for rash.  Neurological:  Positive for speech difficulty, weakness, numbness and headaches. Negative for dizziness and facial asymmetry.    Updated Vital Signs BP (!) 143/84 (BP Location: Left Arm)   Pulse 92   Temp 97.6 F (36.4 C) (Oral)   Resp 18   LMP 06/24/2019   SpO2 100%   Physical Exam Constitutional:      Appearance: She is well-developed.  HENT:     Head: Normocephalic and atraumatic.  Eyes:     Extraocular Movements: Extraocular movements intact.     Pupils: Pupils are equal, round, and reactive to light.  Cardiovascular:     Rate and Rhythm: Normal rate and regular rhythm.     Heart sounds: Normal heart sounds.  Pulmonary:     Effort: Pulmonary effort is normal. No respiratory distress.     Breath sounds: Normal breath sounds. No wheezing or rales.  Chest:     Chest wall: No tenderness.  Abdominal:     General: Bowel sounds are normal.     Palpations: Abdomen is soft.     Tenderness: There is no abdominal tenderness. There is no guarding or rebound.  Musculoskeletal:        General: Normal range of motion.     Cervical back: Normal range of motion and neck supple.     Comments: No swelling of the extremities.  Distal pulses are intact.  She has very small area of ecchymosis to her left upper arm.  No other wounds are noted.  Lymphadenopathy:     Cervical: No cervical adenopathy.  Skin:    General: Skin is warm and dry.     Findings: No rash.  Neurological:     Mental Status: She is alert and oriented to person, place, and time.     Comments: 4 out of 5 strength in the left arm and leg as compared to the right although it does not seem to be consistent.  She has decree sensation to light touch in the left arm and left leg.  Cranial nerves II through XII grossly intact     (all labs ordered are listed, but only abnormal results are displayed) Labs Reviewed  CBC - Abnormal; Notable for the following components:      Result  Value   WBC 13.0 (*)    All other components within normal limits  DIFFERENTIAL - Abnormal; Notable for the following components:   Lymphs Abs 6.6 (*)    All other components within normal limits  COMPREHENSIVE METABOLIC PANEL WITH GFR - Abnormal; Notable for the following components:   Glucose, Bld 122 (*)    Total Protein 8.7 (*)  Anion gap 16 (*)    All other components within normal limits  URINALYSIS, W/ REFLEX TO CULTURE (INFECTION SUSPECTED) - Abnormal; Notable for the following components:   Color, Urine STRAW (*)    APPearance HAZY (*)    All other components within normal limits  I-STAT CHEM 8, ED - Abnormal; Notable for the following components:   Calcium, Ion 1.14 (*)    All other components within normal limits  I-STAT CG4 LACTIC ACID, ED - Abnormal; Notable for the following components:   Lactic Acid, Venous 3.8 (*)    All other components within normal limits  I-STAT CG4 LACTIC ACID, ED - Abnormal; Notable for the following components:   Lactic Acid, Venous 2.6 (*)    All other components within normal limits  PROTIME-INR  APTT  ETHANOL  URINE DRUG SCREEN  CBG MONITORING, ED    EKG: None  Radiology: CT Angio Head Neck W WO CM Result Date: 03/01/2024 EXAM: CTA HEAD AND NECK  WITH 03/01/2024 06:57:51 PM TECHNIQUE: CTA of the head and neck was performed with the administration of 100 mL of iohexol  (OMNIPAQUE ) 350 MG/ML injection. Multiplanar 2D and/or 3D reformatted images are provided for review. Automated exposure control, iterative reconstruction, and/or weight based adjustment of the mA/kV was utilized to reduce the radiation dose to as low as reasonably achievable. Stenosis of the internal carotid arteries measured using NASCET criteria. COMPARISON: None available CLINICAL HISTORY: Neuro deficit, acute, stroke suspected. FINDINGS: CTA NECK: AORTIC ARCH AND ARCH VESSELS: Left vertebral artery originates independently from the aortic arch. No dissection or arterial  injury. No significant stenosis of the brachiocephalic or subclavian arteries. CERVICAL CAROTID ARTERIES: No dissection, arterial injury, or hemodynamically significant stenosis by NASCET criteria. CERVICAL VERTEBRAL ARTERIES: No dissection, arterial injury, or significant stenosis. LUNGS AND MEDIASTINUM: Unremarkable. SOFT TISSUES: No acute abnormality. BONES: No acute abnormality. CTA HEAD: ANTERIOR CIRCULATION: No significant stenosis of the internal carotid arteries. No significant stenosis of the anterior cerebral arteries. No significant stenosis of the middle cerebral arteries. No aneurysm. POSTERIOR CIRCULATION: No significant stenosis of the posterior cerebral arteries. No significant stenosis of the basilar artery. No significant stenosis of the vertebral arteries. No aneurysm. OTHER: No dural venous sinus thrombosis on this non-dedicated study. IMPRESSION: 1. No large vessel occlusion, hemodynamically significant stenosis, or aneurysm in the head or neck. Electronically signed by: Franky Stanford MD MD 03/01/2024 07:19 PM EST RP Workstation: [id number]   MR BRAIN WO CONTRAST Result Date: 03/01/2024 EXAM: MRI BRAIN WITHOUT CONTRAST 03/01/2024 06:29:27 PM TECHNIQUE: Multiplanar multisequence MRI of the head/brain was performed without the administration of intravenous contrast. COMPARISON: 02/20/2022 CLINICAL HISTORY: Neuro deficit, acute, stroke suspected. FINDINGS: BRAIN AND VENTRICLES: No acute infarct. No intracranial hemorrhage. No mass. No midline shift. No hydrocephalus. 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 Normal flow voids. ORBITS: No significant abnormality. SINUSES AND MASTOIDS: No significant abnormality. BONES AND SOFT TISSUES: Normal marrow signal. No significant soft tissue abnormality. IMPRESSION: 1. No acute findings. Electronically signed by: Franky Stanford MD MD 03/01/2024 07:16 PM EST RP Workstation: [id number]   CT HEAD WO CONTRAST Result Date: 03/01/2024 EXAM: CT  HEAD WITHOUT CONTRAST 03/01/2024 03:34:16 PM TECHNIQUE: CT of the head was performed without the administration of intravenous contrast. Automated exposure control, iterative reconstruction, and/or weight based adjustment of the mA/kV was utilized to reduce the radiation dose to as low as reasonably achievable. COMPARISON: CT head 02/11/2023 and MRI brain 02/20/2022. CLINICAL HISTORY: Neuro deficit, acute, stroke suspected. FINDINGS: BRAIN AND  VENTRICLES: No acute hemorrhage. No evidence of acute territorial infarct. No hydrocephalus. No extra-axial collection. No mass effect or midline shift. Overall similar expanded and partially empty sella, nonspecific but can be seen in the setting of idiopathic intracranial hypertension. ORBITS: No acute abnormality. SINUSES: No acute abnormality. SOFT TISSUES AND SKULL: No acute soft tissue abnormality. No skull fracture. IMPRESSION: 1. No acute intracranial abnormality. 2. Overall similar expanded and partially empty sella, nonspecific but can be seen in the setting of idiopathic intracranial hypertension. Electronically signed by: Prentice Spade MD 03/01/2024 04:15 PM EST RP Workstation: [id number]     Procedures   Medications Ordered in the ED  prochlorperazine  (COMPAZINE ) injection 10 mg (10 mg Intravenous Given 03/01/24 1729)  sodium chloride  0.9 % bolus 500 mL (0 mLs Intravenous Stopped 03/01/24 1926)  iohexol  (OMNIPAQUE ) 350 MG/ML injection 100 mL (100 mLs Intravenous Contrast Given 03/01/24 1847)                                    Medical Decision Making Amount and/or Complexity of Data Reviewed Radiology: ordered.  Risk Prescription drug management.   This patient presents to the ED for concern of left-sided numbness and weakness, headache, this involves an extensive number of treatment options, and is a complaint that carries with it a high risk of complications and morbidity.  I considered the following differential and admission for this acute,  potentially life threatening condition.  The differential diagnosis includes stroke, complex migraine, carotid dissection, aortic dissection, cervical radiculopathy  MDM:    Patient is a 49 year old who presents with very symptoms including numbness and weakness in the left arm and left leg associated with some double vision yesterday and difficulty with her speech today which has resolved.  She still has the heaviness and numbness in her left side.  She does not have any facial drooping.  Her speech is clear.  She does have associated headache.  She denies any neck pain.  No recent fevers.  No back pain.  Her labs show mildly elevated white count.  Her lactic acid was elevated.  She was mildly tachycardic on arrival but this resolved.  She had a CT head which did not show any acute abnormality.  Discussed with neurology.  Will get an MRI and a CTA.  MRI does not show any stroke or other acute abnormality.  CTA of the head and neck does not show any dissection, vascular occlusion, venous thrombosis or other acute abnormality.  She was given dose of Compazine  and some IV fluids.  Her symptoms have completely resolved.  She is sitting up smiling and denies any ongoing numbness or weakness in her extremities.  No ongoing headache.  Not sure why her lactic acid was elevated but she does not have any infectious symptoms.  She does not report any fevers or other recent illnesses.  She does not have any concerns for cellulitis.  She does not have any evidence of a urinary tract infection.  She does not have any respiratory symptoms.  I agree discussed with neurology, Dr. Voncile who recommends discharge with outpatient follow-up with her neurologist.  She has previously seen Dr. Skeet.  Discussed this with her and she will call make an appointment to have follow-up with him.  Return precautions were given.  (Labs, imaging, consults)  Labs: I Ordered, and personally interpreted labs.  The pertinent results include:  Elevated WBC count, elevated lactic  acid  Imaging Studies ordered: I ordered imaging studies including CT head, CTA head and neck, MRI brain I independently visualized and interpreted imaging. I agree with the radiologist interpretation  Additional history obtained from husband at bedside, EMS.  External records from outside source obtained and reviewed including prior notes  Cardiac Monitoring: The patient was maintained on a cardiac monitor.  If on the cardiac monitor, I personally viewed and interpreted the cardiac monitored which showed an underlying rhythm of: Sinus rhythm  Reevaluation: After the interventions noted above, I reevaluated the patient and found that they have :improved  Social Determinants of Health:    Disposition: Discharged to home  Co morbidities that complicate the patient evaluation  Past Medical History:  Diagnosis Date   Allergic rhinitis    Anxiety    Chronic back pain    Family history of systemic lupus erythematosus    GERD (gastroesophageal reflux disease)    Hypothyroidism due to Hashimoto's thyroiditis    IDA (iron deficiency anemia)    Morbid obesity (HCC)    OSA (obstructive sleep apnea)    No CPAP   Vitamin D  deficiency      Medicines Meds ordered this encounter  Medications   prochlorperazine  (COMPAZINE ) injection 10 mg   sodium chloride  0.9 % bolus 500 mL   iohexol  (OMNIPAQUE ) 350 MG/ML injection 100 mL    I have reviewed the patients home medicines and have made adjustments as needed  Problem List / ED Course: Problem List Items Addressed This Visit   None Visit Diagnoses       Complicated migraine    -  Primary     Left-sided weakness                    Final diagnoses:  Complicated migraine  Left-sided weakness    ED Discharge Orders     None          Lenor Hollering, MD 03/01/24 2132  "

## 2024-03-01 NOTE — Discharge Instructions (Signed)
 Make an appointment to have close follow-up with your neurologist.  Return to the emergency room if you have any worsening symptoms including fevers, worsening headache, vomiting, increased numbness or weakness, change in your vision or speech or other worsening symptoms.

## 2024-03-05 ENCOUNTER — Other Ambulatory Visit (HOSPITAL_COMMUNITY): Payer: Self-pay

## 2024-03-05 ENCOUNTER — Other Ambulatory Visit: Payer: Self-pay

## 2024-03-05 MED ORDER — HYDROCODONE-ACETAMINOPHEN 10-325 MG PO TABS
1.0000 | ORAL_TABLET | Freq: Three times a day (TID) | ORAL | 0 refills | Status: AC
  Filled 2024-03-05: qty 60, 20d supply, fill #0

## 2024-03-06 ENCOUNTER — Other Ambulatory Visit (HOSPITAL_COMMUNITY): Payer: Self-pay

## 2024-03-20 ENCOUNTER — Ambulatory Visit: Admitting: Family

## 2024-03-29 ENCOUNTER — Other Ambulatory Visit: Payer: Self-pay

## 2024-03-29 ENCOUNTER — Other Ambulatory Visit (HOSPITAL_COMMUNITY): Payer: Self-pay

## 2024-03-29 ENCOUNTER — Other Ambulatory Visit: Payer: Self-pay | Admitting: Family

## 2024-03-29 DIAGNOSIS — E039 Hypothyroidism, unspecified: Secondary | ICD-10-CM

## 2024-03-29 MED ORDER — LEVOTHYROXINE SODIUM 137 MCG PO TABS
137.0000 ug | ORAL_TABLET | Freq: Every day | ORAL | 0 refills | Status: AC
  Filled 2024-03-29: qty 30, 30d supply, fill #0

## 2024-03-29 NOTE — Telephone Encounter (Signed)
 Complete

## 2024-04-24 ENCOUNTER — Ambulatory Visit: Admitting: Family

## 2024-05-08 ENCOUNTER — Other Ambulatory Visit

## 2024-05-15 ENCOUNTER — Ambulatory Visit: Admitting: Hematology and Oncology

## 2024-06-26 ENCOUNTER — Ambulatory Visit: Admitting: Plastic Surgery
